# Patient Record
Sex: Male | Born: 1935 | Race: White | Hispanic: No | Smoking: Former smoker
Health system: Southern US, Community
[De-identification: ages and names within clinical notes are randomized; demographics above are authoritative.]

## PROBLEM LIST (undated history)

## (undated) DIAGNOSIS — I2510 Atherosclerotic heart disease of native coronary artery without angina pectoris: Secondary | ICD-10-CM

## (undated) DIAGNOSIS — M1990 Unspecified osteoarthritis, unspecified site: Secondary | ICD-10-CM

## (undated) DIAGNOSIS — J449 Chronic obstructive pulmonary disease, unspecified: Secondary | ICD-10-CM

## (undated) DIAGNOSIS — I219 Acute myocardial infarction, unspecified: Secondary | ICD-10-CM

## (undated) DIAGNOSIS — G20 Parkinson's disease: Secondary | ICD-10-CM

## (undated) DIAGNOSIS — I10 Essential (primary) hypertension: Secondary | ICD-10-CM

## (undated) DIAGNOSIS — G20A1 Parkinson's disease without dyskinesia, without mention of fluctuations: Secondary | ICD-10-CM

## (undated) DIAGNOSIS — E7800 Pure hypercholesterolemia, unspecified: Secondary | ICD-10-CM

## (undated) HISTORY — PX: HEMORROIDECTOMY: SUR656

---

## 2011-10-25 ENCOUNTER — Inpatient Hospital Stay (HOSPITAL_COMMUNITY)
Admission: EM | Admit: 2011-10-25 | Discharge: 2011-11-10 | DRG: 234 | Disposition: A | Discharge status: Home / Self Care | Payer: Medicare Other | Source: Ambulatory Visit | Attending: Thoracic Surgery (Cardiothoracic Vascular Surgery) | Admitting: Thoracic Surgery (Cardiothoracic Vascular Surgery)

## 2011-10-25 ENCOUNTER — Encounter (HOSPITAL_COMMUNITY): Payer: Self-pay | Admitting: Emergency Medicine

## 2011-10-25 ENCOUNTER — Emergency Department (HOSPITAL_COMMUNITY): Payer: Medicare Other

## 2011-10-25 DIAGNOSIS — M129 Arthropathy, unspecified: Secondary | ICD-10-CM | POA: Diagnosis present

## 2011-10-25 DIAGNOSIS — Z9861 Coronary angioplasty status: Secondary | ICD-10-CM

## 2011-10-25 DIAGNOSIS — T8132XA Disruption of internal operation (surgical) wound, not elsewhere classified, initial encounter: Secondary | ICD-10-CM | POA: Diagnosis not present

## 2011-10-25 DIAGNOSIS — Z951 Presence of aortocoronary bypass graft: Secondary | ICD-10-CM

## 2011-10-25 DIAGNOSIS — E785 Hyperlipidemia, unspecified: Secondary | ICD-10-CM | POA: Diagnosis present

## 2011-10-25 DIAGNOSIS — R21 Rash and other nonspecific skin eruption: Secondary | ICD-10-CM | POA: Diagnosis not present

## 2011-10-25 DIAGNOSIS — D62 Acute posthemorrhagic anemia: Secondary | ICD-10-CM | POA: Diagnosis not present

## 2011-10-25 DIAGNOSIS — E8779 Other fluid overload: Secondary | ICD-10-CM | POA: Diagnosis not present

## 2011-10-25 DIAGNOSIS — K219 Gastro-esophageal reflux disease without esophagitis: Secondary | ICD-10-CM | POA: Diagnosis present

## 2011-10-25 DIAGNOSIS — I10 Essential (primary) hypertension: Secondary | ICD-10-CM | POA: Diagnosis present

## 2011-10-25 DIAGNOSIS — Z79899 Other long term (current) drug therapy: Secondary | ICD-10-CM

## 2011-10-25 DIAGNOSIS — R079 Chest pain, unspecified: Secondary | ICD-10-CM

## 2011-10-25 DIAGNOSIS — Z7982 Long term (current) use of aspirin: Secondary | ICD-10-CM

## 2011-10-25 DIAGNOSIS — Z87891 Personal history of nicotine dependence: Secondary | ICD-10-CM

## 2011-10-25 DIAGNOSIS — K5900 Constipation, unspecified: Secondary | ICD-10-CM | POA: Diagnosis not present

## 2011-10-25 DIAGNOSIS — I2510 Atherosclerotic heart disease of native coronary artery without angina pectoris: Secondary | ICD-10-CM | POA: Diagnosis present

## 2011-10-25 DIAGNOSIS — Y832 Surgical operation with anastomosis, bypass or graft as the cause of abnormal reaction of the patient, or of later complication, without mention of misadventure at the time of the procedure: Secondary | ICD-10-CM | POA: Diagnosis not present

## 2011-10-25 DIAGNOSIS — I498 Other specified cardiac arrhythmias: Secondary | ICD-10-CM | POA: Diagnosis not present

## 2011-10-25 DIAGNOSIS — E669 Obesity, unspecified: Secondary | ICD-10-CM | POA: Diagnosis present

## 2011-10-25 DIAGNOSIS — T81329A Deep disruption or dehiscence of operation wound, unspecified, initial encounter: Secondary | ICD-10-CM | POA: Diagnosis not present

## 2011-10-25 DIAGNOSIS — I252 Old myocardial infarction: Secondary | ICD-10-CM

## 2011-10-25 DIAGNOSIS — I214 Non-ST elevation (NSTEMI) myocardial infarction: Principal | ICD-10-CM | POA: Diagnosis present

## 2011-10-25 DIAGNOSIS — I208 Other forms of angina pectoris: Secondary | ICD-10-CM

## 2011-10-25 DIAGNOSIS — I2582 Chronic total occlusion of coronary artery: Secondary | ICD-10-CM | POA: Diagnosis present

## 2011-10-25 DIAGNOSIS — J40 Bronchitis, not specified as acute or chronic: Secondary | ICD-10-CM | POA: Diagnosis not present

## 2011-10-25 DIAGNOSIS — Y9210 Unspecified residential institution as the place of occurrence of the external cause: Secondary | ICD-10-CM | POA: Diagnosis not present

## 2011-10-25 HISTORY — DX: Unspecified osteoarthritis, unspecified site: M19.90

## 2011-10-25 HISTORY — DX: Acute myocardial infarction, unspecified: I21.9

## 2011-10-25 HISTORY — DX: Atherosclerotic heart disease of native coronary artery without angina pectoris: I25.10

## 2011-10-25 HISTORY — DX: Pure hypercholesterolemia, unspecified: E78.00

## 2011-10-25 LAB — DIFFERENTIAL
Basophils Absolute: 0 10*3/uL (ref 0.0–0.1)
Eosinophils Absolute: 0.2 10*3/uL (ref 0.0–0.7)
Eosinophils Relative: 2 % (ref 0–5)
Lymphocytes Relative: 24 % (ref 12–46)
Lymphs Abs: 2.3 10*3/uL (ref 0.7–4.0)
Neutrophils Relative %: 65 % (ref 43–77)

## 2011-10-25 LAB — CARDIAC PANEL(CRET KIN+CKTOT+MB+TROPI)
CK, MB: 17.7 ng/mL (ref 0.3–4.0)
Total CK: 294 U/L — ABNORMAL HIGH (ref 7–232)
Troponin I: 0.8 ng/mL (ref <0.30)

## 2011-10-25 LAB — CBC
Platelets: 273 10*3/uL (ref 150–400)
RBC: 5.15 MIL/uL (ref 4.22–5.81)
RDW: 13.5 % (ref 11.5–15.5)
WBC: 9.7 10*3/uL (ref 4.0–10.5)

## 2011-10-25 LAB — BASIC METABOLIC PANEL
CO2: 27 mEq/L (ref 19–32)
Calcium: 10.1 mg/dL (ref 8.4–10.5)
GFR calc non Af Amer: 50 mL/min — ABNORMAL LOW (ref >90)
Glucose, Bld: 105 mg/dL — ABNORMAL HIGH (ref 70–99)
Potassium: 4.4 mEq/L (ref 3.5–5.1)
Sodium: 136 mEq/L (ref 135–145)

## 2011-10-25 MED ORDER — SODIUM CHLORIDE 0.9 % IV SOLN
INTRAVENOUS | Status: DC
  Administered 2011-10-29: 09:00:00 via INTRAVENOUS

## 2011-10-25 MED ORDER — HEPARIN BOLUS VIA INFUSION
4000.0000 [IU] | Freq: Once | INTRAVENOUS | Status: AC
  Administered 2011-10-25: 4000 [IU] via INTRAVENOUS

## 2011-10-25 MED ORDER — NITROGLYCERIN 2 % TD OINT
1.0000 [in_us] | TOPICAL_OINTMENT | Freq: Once | TRANSDERMAL | Status: AC
  Administered 2011-10-25: 1 [in_us] via TOPICAL

## 2011-10-25 MED ORDER — NITROGLYCERIN 2 % TD OINT
TOPICAL_OINTMENT | TRANSDERMAL | Status: AC
  Filled 2011-10-25: qty 1

## 2011-10-25 MED ORDER — DIPHENHYDRAMINE HCL 25 MG PO CAPS
25.0000 mg | ORAL_CAPSULE | Freq: Once | ORAL | Status: AC
  Administered 2011-10-25: 25 mg via ORAL
  Filled 2011-10-25: qty 1

## 2011-10-25 MED ORDER — BIOTENE DRY MOUTH MT LIQD: 15.0000 mL | Freq: Two times a day (BID) | OROMUCOSAL | Status: DC

## 2011-10-25 MED ORDER — ATORVASTATIN CALCIUM 40 MG PO TABS
40.0000 mg | ORAL_TABLET | Freq: Every day | ORAL | Status: DC
  Administered 2011-10-26 – 2011-10-27 (×2): 40 mg via ORAL
  Filled 2011-10-25 (×3): qty 1

## 2011-10-25 MED ORDER — ONDANSETRON HCL 4 MG/2ML IJ SOLN: 4.0000 mg | Freq: Four times a day (QID) | INTRAMUSCULAR | Status: DC | PRN

## 2011-10-25 MED ORDER — NITROGLYCERIN IN D5W 200-5 MCG/ML-% IV SOLN
5.0000 ug/min | INTRAVENOUS | Status: DC
  Administered 2011-10-25: 5 ug/min via INTRAVENOUS
  Filled 2011-10-25: qty 250

## 2011-10-25 MED ORDER — ACETAMINOPHEN 325 MG PO TABS: 650.0000 mg | ORAL_TABLET | ORAL | Status: DC | PRN

## 2011-10-25 MED ORDER — ASPIRIN 325 MG PO TABS
325.0000 mg | ORAL_TABLET | Freq: Every day | ORAL | Status: DC
  Administered 2011-10-26 – 2011-10-31 (×5): 325 mg via ORAL
  Filled 2011-10-25 (×7): qty 1

## 2011-10-25 MED ORDER — HEPARIN (PORCINE) IN NACL 100-0.45 UNIT/ML-% IJ SOLN
1500.0000 [IU]/h | INTRAMUSCULAR | Status: DC
  Administered 2011-10-25: 1000 [IU]/h via INTRAVENOUS
  Filled 2011-10-25 (×2): qty 250

## 2011-10-25 MED ORDER — NITROGLYCERIN 0.4 MG SL SUBL
0.4000 mg | SUBLINGUAL_TABLET | SUBLINGUAL | Status: DC | PRN
  Administered 2011-10-25: 0.4 mg via SUBLINGUAL
  Filled 2011-10-25: qty 25

## 2011-10-25 MED ORDER — METOPROLOL TARTRATE 50 MG PO TABS
50.0000 mg | ORAL_TABLET | Freq: Two times a day (BID) | ORAL | Status: DC
  Administered 2011-10-25 – 2011-10-31 (×13): 50 mg via ORAL
  Filled 2011-10-25 (×15): qty 1

## 2011-10-25 MED ORDER — PANTOPRAZOLE SODIUM 40 MG PO TBEC
40.0000 mg | DELAYED_RELEASE_TABLET | Freq: Every day | ORAL | Status: DC
  Administered 2011-10-25 – 2011-10-31 (×7): 40 mg via ORAL
  Filled 2011-10-25 (×7): qty 1

## 2011-10-25 MED ORDER — SODIUM CHLORIDE 0.9 % IV SOLN
Freq: Once | INTRAVENOUS | Status: AC
  Administered 2011-10-25: 250 mL via INTRAVENOUS

## 2011-10-25 MED ORDER — RAMIPRIL 2.5 MG PO CAPS
2.5000 mg | ORAL_CAPSULE | Freq: Every day | ORAL | Status: DC
  Administered 2011-10-26 – 2011-10-31 (×6): 2.5 mg via ORAL
  Filled 2011-10-25 (×7): qty 1

## 2011-10-25 NOTE — Progress Notes (Signed)
ANTICOAGULATION CONSULT NOTE - Initial Consult  Pharmacy Consult for Heparin Indication: chest pain/ACS  No Known Allergies  Patient Measurements: Height: 5\' 10"  (177.8 cm) Weight: 238 lb 1.6 oz (108 kg) IBW/kg (Calculated) : 73  Heparin Dosing Weight: 99  Vital Signs: Temp: 97.5 F (36.4 C) (04/19 2115) Temp src: Oral (04/19 2115) BP: 132/69 mmHg (04/19 2200) Pulse Rate: 75  (04/19 2200)  Labs:  Basename 10/25/11 1731 10/25/11 1719  HGB -- 15.4  HCT -- 46.2  PLT -- 273  APTT -- --  LABPROT -- --  INR -- --  HEPARINUNFRC -- --  CREATININE -- 1.33  CKTOTAL 294* --  CKMB 17.7* --  TROPONINI 0.80* --   Estimated Creatinine Clearance: 56.3 ml/min (by C-G formula based on Cr of 1.33).  Medical History: Past Medical History  Diagnosis Date  . Myocardial infarct   . Hypertension   . Hypercholesteremia   . Arthritis   . Coronary artery disease     s/p PCI 10 yrs ago.    Medications:  Prescriptions prior to admission  Medication Sig Dispense Refill  . aspirin 325 MG tablet Take 325 mg by mouth daily.      Marland Kitchen atorvastatin (LIPITOR) 40 MG tablet Take 40 mg by mouth at bedtime.      . cimetidine (TAGAMET) 800 MG tablet Take 800 mg by mouth 2 (two) times daily.      . metoprolol (LOPRESSOR) 50 MG tablet Take 50 mg by mouth 2 (two) times daily.      . nitroGLYCERIN (NITROSTAT) 0.4 MG SL tablet Place 0.4 mg under the tongue every 5 (five) minutes as needed.      . ramipril (ALTACE) 2.5 MG capsule Take 2.5 mg by mouth daily.        Assessment: Heparin started at [facility] ED for chest pain at 7:30 pm  Heparin bolus 4000 units IV x 1 then drip at 1000 units/hr Goal of Therapy:  Heparin level 0.3-0.7 units/ml   Plan:  1) Increase heparin to 1500 units / hr 2) Daily heparin level, CBC  Thanks  Elwin Sleight 10/25/2011,10:27 PM

## 2011-10-25 NOTE — ED Notes (Signed)
CRITICAL VALUE ALERT  Critical value received:  ckmb 17.7  Trop 0.80  Date of notification:  10/25/2011  Time of notification:  632pm  Critical value read back:yes  Nurse who received alert:  c Delanda Bulluck rn  MD notified (1st page):  Dr zammit  Time of first page:  632pm  MD notified (2nd page):  Time of second page:  Responding MD:  Dr zammit  Time MD responded:  632pm Ronnald Collum RN aware

## 2011-10-25 NOTE — ED Notes (Signed)
Intermittent CP and left arm pain since 0300. Pt states pain was relieved for a "little while" by nitro. NAD. VSS.

## 2011-10-25 NOTE — ED Notes (Signed)
Report given to [facility]  

## 2011-10-25 NOTE — ED Notes (Signed)
Pt c/o stabbing pain to L side of chest with weakness down L arm intermittant since yesterday. Denies sob/n/v/d. States did get dizzy in yard yesterday. Alert/oriented. Nondiaphoretic.

## 2011-10-25 NOTE — ED Notes (Signed)
Pt contines to deny pain. O2@ 2 L applied.

## 2011-10-25 NOTE — ED Notes (Signed)
Pt transferred to [facility], via [facility].

## 2011-10-25 NOTE — ED Notes (Signed)
Second IV site started.  Heparin bolus given.  Nitro and heparin gtt started. Nitropaste removed from left chest.   Pt tolerated.  Denies pain at this time.

## 2011-10-25 NOTE — ED Notes (Signed)
Pts pain decreased from 7 to 4 after one nitro. SBP dropped from 134 to 116 after 1st nitro. Spoke to EMD and will give 250 bolus and apply one inch NTG paste.

## 2011-10-25 NOTE — ED Notes (Signed)
Pt denies pain at this time. VS stable. IV infusing without difficulty, site benign.  Pt denies needs at present time. No distress noted.

## 2011-10-25 NOTE — ED Provider Notes (Signed)
History   This chart was scribed for Benny Lennert, MD by Melba Coon. The patient was seen in room APA10/APA10 and the patient's care was started at 5:26PM.    CSN: [id number]  Arrival date & time 10/25/11  1707   First MD Initiated Contact with Patient 10/25/11 1721      Chief Complaint  Patient presents with  . Chest Pain    (Consider location/radiation/quality/duration/timing/severity/associated sxs/prior treatment) Patient is a 78 y.o. male presenting with chest pain.  Chest Pain The chest pain began yesterday. Duration of episode(s) is 2 days. Chest pain occurs constantly. The chest pain is unchanged. The severity of the pain is moderate. The quality of the pain is described as stabbing. The pain does not radiate. Chest pain is worsened by exertion. Primary symptoms include nausea, vomiting and dizziness. Pertinent negatives for primary symptoms include no fever, no shortness of breath and no cough.  Dizziness also occurs with nausea, vomiting, weakness (left arm) and diaphoresis.  Associated symptoms include diaphoresis and weakness (left arm). He tried nitroglycerin and aspirin for the symptoms. Risk factors include being elderly and male gender.  His past medical history is significant for hyperhomocysteinemia, hypertension and MI.   2 weeks ago pt experienced previous but lessened episode and symptoms; didn't want to come in; took ASA and was fine. Hx of MI and heart stents about 10 yrs ago. No known allergies. No other pertinent medical symptoms.  PCP Dr. Dixie Dials in [place]  Past Medical History  Diagnosis Date  . Myocardial infarct   . Hypertension   . Hypercholesteremia   . Arthritis     Past Surgical History  Procedure Date  . Cardiac stents   . Hemorroidectomy     History reviewed. No pertinent family history.  History  Substance Use Topics  . Smoking status: Former Games developer  . Smokeless tobacco: Not on file  . Alcohol Use: No       Review of Systems  Constitutional: Positive for diaphoresis. Negative for fever.  HENT: Negative for congestion and rhinorrhea.   Eyes: Negative for photophobia and redness.  Respiratory: Negative for cough and shortness of breath.   Cardiovascular: Positive for chest pain.  Gastrointestinal: Positive for nausea and vomiting. Negative for diarrhea.  Genitourinary: Negative for dysuria and hematuria.  Musculoskeletal: Negative for myalgias and joint swelling.  Skin: Negative for pallor and rash.  Neurological: Positive for dizziness and weakness (left arm).  Psychiatric/Behavioral: Negative for confusion and decreased concentration.   10 Systems reviewed and all are negative for acute change except as noted in the HPI.   Allergies  Review of patient's allergies indicates no known allergies.  Home Medications   Current Outpatient Rx  Name Route Sig Dispense Refill  . CIMETIDINE 800 MG PO TABS Oral Take 800 mg by mouth 2 (two) times daily.    Marland Kitchen METOPROLOL TARTRATE 50 MG PO TABS Oral Take 50 mg by mouth 2 (two) times daily.      BP 119/62  Pulse 64  Temp(Src) 97.6 F (36.4 C) (Oral)  Resp 16  Ht 5' 10.5" (1.791 m)  Wt 240 lb (108.863 kg)  BMI 33.95 kg/m2  SpO2 96%  Physical Exam  Nursing note and vitals reviewed. Constitutional: He is oriented to person, place, and time. He appears well-developed and well-nourished.       Awake, alert, nontoxic appearance.  HENT:  Head: Normocephalic and atraumatic.  Eyes: EOM are normal. Pupils are equal, round, and reactive to light. Right  eye exhibits no discharge. Left eye exhibits no discharge.  Neck: Normal range of motion. Neck supple.  Cardiovascular: Normal rate, regular rhythm and normal heart sounds.  Exam reveals no gallop and no friction rub.   No murmur heard. Pulmonary/Chest: Effort normal and breath sounds normal. He exhibits no tenderness.  Abdominal: Soft. There is no tenderness. There is no rebound.   Musculoskeletal: He exhibits no tenderness.       Baseline ROM, no obvious new focal weakness.  Neurological: He is alert and oriented to person, place, and time.       Mental status and motor strength appears baseline for patient and situation.  Skin: Skin is warm. No rash noted.  Psychiatric: He has a normal mood and affect.    ED Course  Procedures (including critical care time)  DIAGNOSTIC STUDIES: Oxygen Saturation is 95% on room air, normal by my interpretation.    COORDINATION OF CARE:  5:30PM - EDMD will order blood w/u and CXR for the pt 6:14PM - recheck; sublingual NTG and fluids alleviated the pain; pt states that pain is gone; EDMD states that EKG nml; states that pain was heart-related; EDMD lets pt know that he needs to be transferred and transported to [facility] to see cardiologist; pt agrees with plan of action   Labs Reviewed  BASIC METABOLIC PANEL - Abnormal; Notable for the following:    Glucose, Bld 105 (*)    GFR calc non Af Amer 50 (*)    GFR calc Af Amer 57 (*)    All other components within normal limits  CARDIAC PANEL(CRET KIN+CKTOT+MB+TROPI) - Abnormal; Notable for the following:    Total CK 294 (*)    CK, MB 17.7 (*)    Troponin I 0.80 (*)    Relative Index 6.0 (*)    All other components within normal limits  CBC  DIFFERENTIAL   Dg Chest Portable 1 View  10/25/2011  *RADIOLOGY REPORT*  Clinical Data: Chest and left arm pain.  PORTABLE CHEST - 1 VIEW  Comparison: None.  Findings: Numerous leads and wires project over the chest.  Midline trachea.  Normal heart size.  Mildly tortuous thoracic aorta. Artifactual increased density about the left hemithorax. No pleural effusion or pneumothorax.  Probable vessel on end projecting in the right suprahilar region.  Lungs otherwise clear.  IMPRESSION:  1. No acute cardiopulmonary disease. 2.  Probable vessel on end projecting over the right suprahilar region.  If possible, PA and lateral radiographs should be  performed to exclude pulmonary nodule.  Original Report Authenticated By: Consuello Bossier, M.D.     No diagnosis found. CRITICAL CARE Performed by: Purnell Daigle L   Total critical care time: 45  Critical care time was exclusive of separately billable procedures and treating other patients.  Critical care was necessary to treat or prevent imminent or life-threatening deterioration.  Critical care was time spent personally by me on the following activities: development of treatment plan with patient and/or surrogate as well as nursing, discussions with consultants, evaluation of patient's response to treatment, examination of patient, obtaining history from patient or surrogate, ordering and performing treatments and interventions, ordering and review of laboratory studies, ordering and review of radiographic studies, pulse oximetry and re-evaluation of patient's condition.     Date: 10/25/2011  Rate:74  Rhythm: normal sinus rhythm  QRS Axis: normal  Intervals: normal  ST/T Wave abnormalities: normal  Conduction Disutrbances:none  Narrative Interpretation:   Old EKG Reviewed: none available  MDM  angina  The chart was scribed for me under my direct supervision.  I personally performed the history, physical, and medical decision making and all procedures in the evaluation of this patient.Benny Lennert, MD 10/25/11 [phone]

## 2011-10-25 NOTE — H&P (Signed)
ALVINO LECHUGA is an 78 y.o. male.   Chief Complaint: Chest pain HPI: Mr Budai is a 78 yr old man with a remote MI s/p PCI with "2 stents" about 10 yrs ago. His risk factors include HTN and hyperlipidemia. He presents with left sided chest pain that was stabbing in nature, radiated down his left arm and was associated with nausea, vomiting, diaphoresis, shortness of breath and lightheadeness (not all at the same time). Pain first occurred about 2 weeks ago and then woke him up from sleep at about 3 am last night. Pain occurs mainly at rest but worsened with exertion. It was incompletely relieved by NTG. However, his NTG was prescribed about 4 yrs ago and most likely expired. The NTG he received in the ER completely relieved the pain. His cardiac hx is significant for the remote MI about 10 yrs ago. At that time he lived in [place]. His daughter recently moved him down to [place] about 6 months ago. He does not seem he has had any cardiac re-evaluation for about 4 yrs. I met him pain free, his EKG had non specific st-t abnormalities otherwise normal and his troponin was 0.8. He had already received ASA, heparin drip and was on NTG drip.   Past Medical History  Diagnosis Date  . Myocardial infarct   . Hypertension   . Hypercholesteremia   . Arthritis   . Coronary artery disease     s/p PCI 10 yrs ago.    Past Surgical History  Procedure Date  . Cardiac stents   . Hemorroidectomy     History reviewed. No pertinent family history. Social History:  reports that he has quit smoking. He does not have any smokeless tobacco history on file. He reports that he does not drink alcohol or use illicit drugs.  Allergies: No Known Allergies  Medications Prior to Admission  Medication Dose Route Frequency Provider Last Rate Last Dose  . 0.9 %  sodium chloride infusion   Intravenous Once Benny Lennert, MD 500 mL/hr at 10/25/11 1803 250 mL at 10/25/11 1803  . 0.9 %  sodium chloride infusion    Intravenous Continuous Kathlen Brunswick, MD 10 mL/hr at 10/25/11 2200    . acetaminophen (TYLENOL) tablet 650 mg  650 mg Oral Q4H PRN Kathlen Brunswick, MD      . aspirin tablet 325 mg  325 mg Oral Daily Kathlen Brunswick, MD      . atorvastatin (LIPITOR) tablet 40 mg  40 mg Oral QHS Kathlen Brunswick, MD      . diphenhydrAMINE (BENADRYL) capsule 25 mg  25 mg Oral Once Kathlen Brunswick, MD   25 mg at 10/25/11 2301  . heparin ADULT infusion 100 units/mL (25000 units/250 mL)  1,500 Units/hr Intravenous Continuous Kathlen Brunswick, MD 15 mL/hr at 10/25/11 2239 1,500 Units/hr at 10/25/11 2239  . heparin bolus via infusion 4,000 Units  4,000 Units Intravenous Once Benny Lennert, MD   4,000 Units at 10/25/11 1936  . metoprolol (LOPRESSOR) tablet 50 mg  50 mg Oral BID Kathlen Brunswick, MD   50 mg at 10/25/11 2301  . nitroGLYCERIN (NITROGLYN) 2 % ointment 1 inch  1 inch Topical Once Benny Lennert, MD   1 inch at 10/25/11 1804  . nitroGLYCERIN (NITROSTAT) SL tablet 0.4 mg  0.4 mg Sublingual Q5 min PRN Benny Lennert, MD   0.4 mg at 10/25/11 1739  . nitroGLYCERIN 0.2 mg/mL in dextrose 5 %  infusion  5 mcg/min Intravenous Titrated Benny Lennert, MD 1.5 mL/hr at 10/25/11 1935 5 mcg/min at 10/25/11 1935  . ondansetron (ZOFRAN) injection 4 mg  4 mg Intravenous Q6H PRN Kathlen Brunswick, MD      . pantoprazole (PROTONIX) EC tablet 40 mg  40 mg Oral Q1200 Kathlen Brunswick, MD   40 mg at 10/25/11 2239  . ramipril (ALTACE) capsule 2.5 mg  2.5 mg Oral Daily Kathlen Brunswick, MD      . DISCONTD: antiseptic oral rinse (BIOTENE) solution 15 mL  15 mL Mouth Rinse BID Kathlen Brunswick, MD       Medications Prior to Admission  Medication Sig Dispense Refill  . atorvastatin (LIPITOR) 40 MG tablet Take 40 mg by mouth at bedtime.      . cimetidine (TAGAMET) 800 MG tablet Take 800 mg by mouth 2 (two) times daily.      . metoprolol (LOPRESSOR) 50 MG tablet Take 50 mg by mouth 2 (two) times daily.      .  nitroGLYCERIN (NITROSTAT) 0.4 MG SL tablet Place 0.4 mg under the tongue every 5 (five) minutes as needed.      . ramipril (ALTACE) 2.5 MG capsule Take 2.5 mg by mouth daily.        Results for orders placed during the hospital encounter of 10/25/11 (from the past 48 hour(s))  CBC     Status: Normal   Collection Time   10/25/11  5:19 PM      Component Value Range Comment   WBC 9.7  4.0 - 10.5 (K/uL)    RBC 5.15  4.22 - 5.81 (MIL/uL)    Hemoglobin 15.4  13.0 - 17.0 (g/dL)    HCT 16.1  09.6 - 04.5 (%)    MCV 89.7  78.0 - 100.0 (fL)    MCH 29.9  26.0 - 34.0 (pg)    MCHC 33.3  30.0 - 36.0 (g/dL)    RDW 40.9  81.1 - 91.4 (%)    Platelets 273  150 - 400 (K/uL)   DIFFERENTIAL     Status: Normal   Collection Time   10/25/11  5:19 PM      Component Value Range Comment   Neutrophils Relative 65  43 - 77 (%)    Neutro Abs 6.3  1.7 - 7.7 (K/uL)    Lymphocytes Relative 24  12 - 46 (%)    Lymphs Abs 2.3  0.7 - 4.0 (K/uL)    Monocytes Relative 9  3 - 12 (%)    Monocytes Absolute 0.9  0.1 - 1.0 (K/uL)    Eosinophils Relative 2  0 - 5 (%)    Eosinophils Absolute 0.2  0.0 - 0.7 (K/uL)    Basophils Relative 0  0 - 1 (%)    Basophils Absolute 0.0  0.0 - 0.1 (K/uL)   BASIC METABOLIC PANEL     Status: Abnormal   Collection Time   10/25/11  5:19 PM      Component Value Range Comment   Sodium 136  135 - 145 (mEq/L)    Potassium 4.4  3.5 - 5.1 (mEq/L)    Chloride 100  96 - 112 (mEq/L)    CO2 27  19 - 32 (mEq/L)    Glucose, Bld 105 (*) 70 - 99 (mg/dL)    BUN 13  6 - 23 (mg/dL)    Creatinine, Ser 7.82  0.50 - 1.35 (mg/dL)    Calcium 95.6  8.4 -  10.5 (mg/dL)    GFR calc non Af Amer 50 (*) >90 (mL/min)    GFR calc Af Amer 57 (*) >90 (mL/min)   CARDIAC PANEL(CRET KIN+CKTOT+MB+TROPI)     Status: Abnormal   Collection Time   10/25/11  5:31 PM      Component Value Range Comment   Total CK 294 (*) 7 - 232 (U/L)    CK, MB 17.7 (*) 0.3 - 4.0 (ng/mL)    Troponin I 0.80 (*) <0.30 (ng/mL)    Relative Index  6.0 (*) 0.0 - 2.5     Dg Chest Portable 1 View  10/25/2011  *RADIOLOGY REPORT*  Clinical Data: Chest and left arm pain.  PORTABLE CHEST - 1 VIEW  Comparison: None.  Findings: Numerous leads and wires project over the chest.  Midline trachea.  Normal heart size.  Mildly tortuous thoracic aorta. Artifactual increased density about the left hemithorax. No pleural effusion or pneumothorax.  Probable vessel on end projecting in the right suprahilar region.  Lungs otherwise clear.  IMPRESSION:  1. No acute cardiopulmonary disease. 2.  Probable vessel on end projecting over the right suprahilar region.  If possible, PA and lateral radiographs should be performed to exclude pulmonary nodule.  Original Report Authenticated By: Consuello Bossier, M.D.    Review of Systems  Constitutional: Positive for diaphoresis.  HENT: Negative.   Eyes: Negative.   Respiratory: Positive for shortness of breath. Negative for cough, hemoptysis, sputum production and wheezing.   Cardiovascular: Positive for chest pain and palpitations. Negative for orthopnea, claudication, leg swelling and PND.  Gastrointestinal: Positive for heartburn, nausea and vomiting. Negative for abdominal pain and diarrhea.  Genitourinary: Negative.   Musculoskeletal: Negative.   Skin: Negative.   Neurological: Positive for dizziness.  Endo/Heme/Allergies: Negative.   Psychiatric/Behavioral: Negative.     Blood pressure 140/69, pulse 72, temperature 97.5 F (36.4 C), temperature source Oral, resp. rate 18, height 5\' 10"  (1.778 m), weight 238 lb 1.6 oz (108 kg), SpO2 97.00%. Physical Exam  Constitutional: He is oriented to person, place, and time. No distress.  HENT:  Head: Normocephalic.  Mouth/Throat: No oropharyngeal exudate.  Eyes: Conjunctivae and EOM are normal. Pupils are equal, round, and reactive to light. No scleral icterus.  Neck: No JVD present. No tracheal deviation present. No thyromegaly present.  Cardiovascular: Normal rate,  regular rhythm, normal heart sounds and intact distal pulses.  Exam reveals no gallop and no friction rub.   No murmur heard. Respiratory: Effort normal and breath sounds normal. No respiratory distress. He has no rales.  GI: Soft. He exhibits distension. There is no tenderness.       Distension is from fat.  Genitourinary:       Not done.  Musculoskeletal: Normal range of motion. He exhibits no edema and no tenderness.  Lymphadenopathy:    He has no cervical adenopathy.  Neurological: He is alert and oriented to person, place, and time.  Skin: Skin is warm and dry. No rash noted. He is not diaphoretic. No erythema.  Psychiatric: He has a normal mood and affect. His behavior is normal. Judgment and thought content normal.     Assessment/Plan NSTEMI in a patient with known CAD s/p remote PCI (No S3, no JVD, no rales, no hypotension, pain free) HTN Hyperlipidemia GERD  PLAN Admit to step down ASA 325 mg daily Plavix 300mg  stat then 75mg  daily Heparin infusion NTG drip Metoprolol 50 mg twice daily Ramipril 2.5mg  daily Lipitor 40 mg daily Protonix to treat  his GERD and for GI bleed prophylaxis Serial EKG and cardiac markers 2D echo in am  Cath on Monday.  Grandville Silos 10/25/2011, 11:42 PM

## 2011-10-26 DIAGNOSIS — I10 Essential (primary) hypertension: Secondary | ICD-10-CM

## 2011-10-26 DIAGNOSIS — E785 Hyperlipidemia, unspecified: Secondary | ICD-10-CM

## 2011-10-26 DIAGNOSIS — I214 Non-ST elevation (NSTEMI) myocardial infarction: Secondary | ICD-10-CM

## 2011-10-26 DIAGNOSIS — I517 Cardiomegaly: Secondary | ICD-10-CM

## 2011-10-26 LAB — CARDIAC PANEL(CRET KIN+CKTOT+MB+TROPI)
CK, MB: 43.9 ng/mL (ref 0.3–4.0)
CK, MB: 32.9 ng/mL (ref 0.3–4.0)
Total CK: 521 U/L — ABNORMAL HIGH (ref 7–232)
Troponin I: 2.88 ng/mL (ref <0.30)
Troponin I: 4.44 ng/mL (ref <0.30)

## 2011-10-26 LAB — CBC
HCT: 42.7 % (ref 39.0–52.0)
HCT: 44.3 % (ref 39.0–52.0)
Hemoglobin: 14.6 g/dL (ref 13.0–17.0)
Hemoglobin: 15.4 g/dL (ref 13.0–17.0)
MCH: 30.4 pg (ref 26.0–34.0)
MCHC: 34.2 g/dL (ref 30.0–36.0)
MCHC: 34.8 g/dL (ref 30.0–36.0)
MCV: 88.8 fL (ref 78.0–100.0)
MCV: 89.5 fL (ref 78.0–100.0)
RDW: 13.7 % (ref 11.5–15.5)

## 2011-10-26 LAB — BASIC METABOLIC PANEL
BUN: 11 mg/dL (ref 6–23)
BUN: 10 mg/dL (ref 6–23)
CO2: 27 mEq/L (ref 19–32)
CO2: 22 mEq/L (ref 19–32)
Chloride: 101 mEq/L (ref 96–112)
Creatinine, Ser: 1.12 mg/dL (ref 0.50–1.35)
GFR calc non Af Amer: 55 mL/min — ABNORMAL LOW (ref >90)
Glucose, Bld: 115 mg/dL — ABNORMAL HIGH (ref 70–99)
Glucose, Bld: 110 mg/dL — ABNORMAL HIGH (ref 70–99)
Potassium: 3.6 mEq/L (ref 3.5–5.1)
Potassium: 4.3 mEq/L (ref 3.5–5.1)

## 2011-10-26 LAB — LIPID PANEL
Cholesterol: 166 mg/dL (ref 0–200)
LDL Cholesterol: 86 mg/dL (ref 0–99)
VLDL: 26 mg/dL (ref 0–40)

## 2011-10-26 LAB — PROTIME-INR: Prothrombin Time: 13.2 seconds (ref 11.6–15.2)

## 2011-10-26 LAB — MAGNESIUM: Magnesium: 2.2 mg/dL (ref 1.5–2.5)

## 2011-10-26 LAB — PRO B NATRIURETIC PEPTIDE: Pro B Natriuretic peptide (BNP): 2084 pg/mL — ABNORMAL HIGH (ref 0–450)

## 2011-10-26 MED ORDER — HEPARIN (PORCINE) IN NACL 100-0.45 UNIT/ML-% IJ SOLN
1300.0000 [IU]/h | INTRAMUSCULAR | Status: DC
  Administered 2011-10-26 – 2011-10-27 (×2): 1300 [IU]/h via INTRAVENOUS
  Filled 2011-10-26 (×4): qty 250

## 2011-10-26 MED ORDER — CLOPIDOGREL BISULFATE 300 MG PO TABS
300.0000 mg | ORAL_TABLET | Freq: Once | ORAL | Status: AC
  Administered 2011-10-26: 300 mg via ORAL
  Filled 2011-10-26: qty 1

## 2011-10-26 MED ORDER — ALUM & MAG HYDROXIDE-SIMETH 200-200-20 MG/5ML PO SUSP
15.0000 mL | ORAL | Status: DC | PRN
  Filled 2011-10-26: qty 30

## 2011-10-26 MED ORDER — ZOLPIDEM TARTRATE 5 MG PO TABS
2.5000 mg | ORAL_TABLET | Freq: Once | ORAL | Status: AC
  Administered 2011-10-26: 2.5 mg via ORAL
  Filled 2011-10-26: qty 1

## 2011-10-26 MED ORDER — CLOPIDOGREL BISULFATE 75 MG PO TABS
75.0000 mg | ORAL_TABLET | Freq: Every day | ORAL | Status: DC
  Administered 2011-10-26 – 2011-10-27 (×2): 75 mg via ORAL
  Filled 2011-10-26 (×4): qty 1

## 2011-10-26 NOTE — Progress Notes (Signed)
Subjective:   Mr Hevia is a 78 yr old man with HTN, HL and CAD with a remote MI s/p PCI with "2 stents" about 10-15 yrs ago in [place]. He was admitted last night (4/19) with NSTEMI.  Remains pain free on heparin, NTG, ASA, plavix, lopressor and atorva  Intake/Output Summary (Last 24 hours) at 10/26/11 0952 Last data filed at 10/26/11 0800  Gross per 24 hour  Intake 959.71 ml  Output   1000 ml  Net -40.29 ml    Current meds:    . sodium chloride   Intravenous Once  . aspirin  325 mg Oral Daily  . atorvastatin  40 mg Oral QHS  . clopidogrel  300 mg Oral Once  . clopidogrel  75 mg Oral Q breakfast  . diphenhydrAMINE  25 mg Oral Once  . heparin  4,000 Units Intravenous Once  . metoprolol  50 mg Oral BID  . nitroGLYCERIN  1 inch Topical Once  . pantoprazole  40 mg Oral Q1200  . ramipril  2.5 mg Oral Daily  . DISCONTD: antiseptic oral rinse  15 mL Mouth Rinse BID   Infusions:    . sodium chloride 10 mL/hr at 10/25/11 2200  . heparin    . nitroGLYCERIN 5 mcg/min (10/25/11 1935)  . DISCONTD: heparin 1,500 Units/hr (10/25/11 2239)     Objective:  Blood pressure 121/63, pulse 65, temperature 98.1 F (36.7 C), temperature source Oral, resp. rate 21, height 5\' 10"  (1.778 m), weight 108.4 kg (238 lb 15.7 oz), SpO2 95.00%. Weight change:   Physical Exam: General:  Well appearing. No resp difficulty HEENT: normal Neck: supple. JVP unable to see . Carotids 2+ bilat; no bruits. No lymphadenopathy or thryomegaly appreciated. Cor: PMI non palpable. HS distant  Regular rate & rhythm. No rubs, gallops or murmurs. Lungs: clear Abdomen: soft, obese nontender, nondistended Good bowel sounds. Extremities: no cyanosis, clubbing, rash, edema Neuro: alert & orientedx3, cranial nerves grossly intact. moves all 4 extremities w/o difficulty. Affect pleasant  Telemetry: SR 60s  Lab Results: Basic Metabolic Panel:  Lab 10/26/11 2956 10/25/11 1719  NA 137 136  K 3.6 4.4  CL 102 100   CO2 27 27  GLUCOSE 115* 105*  BUN 11 13  CREATININE 1.22 1.33  CALCIUM 9.2 10.1  MG 2.2 --  PHOS -- --   Liver Function Tests: No results found for this basename: AST:5,ALT:5,ALKPHOS:5,BILITOT:5,PROT:5,ALBUMIN:5 in the last 168 hours No results found for this basename: LIPASE:5,AMYLASE:5 in the last 168 hours No results found for this basename: AMMONIA:5 in the last 168 hours CBC:  Lab 10/26/11 0205 10/25/11 1719  WBC 10.2 9.7  NEUTROABS -- 6.3  HGB 14.6 15.4  HCT 42.7 46.2  MCV 88.8 89.7  PLT 253 273   Cardiac Enzymes:  Lab 10/26/11 0832 10/26/11 0149 10/25/11 1731  CKTOTAL 609* 548* 294*  CKMB 43.9* 42.0* 17.7*  CKMBINDEX -- -- --  TROPONINI 4.44* 2.88* 0.80*   BNP: No components found with this basename: POCBNP:5 CBG: No results found for this basename: GLUCAP:5 in the last 168 hours Microbiology: No results found for this basename: cult   No results found for this basename: CULT:2,SDES:2 in the last 168 hours  Imaging: Dg Chest Portable 1 View  10/25/2011  *RADIOLOGY REPORT*  Clinical Data: Chest and left arm pain.  PORTABLE CHEST - 1 VIEW  Comparison: None.  Findings: Numerous leads and wires project over the chest.  Midline trachea.  Normal heart size.  Mildly tortuous thoracic  aorta. Artifactual increased density about the left hemithorax. No pleural effusion or pneumothorax.  Probable vessel on end projecting in the right suprahilar region.  Lungs otherwise clear.  IMPRESSION:  1. No acute cardiopulmonary disease. 2.  Probable vessel on end projecting over the right suprahilar region.  If possible, PA and lateral radiographs should be performed to exclude pulmonary nodule.  Original Report Authenticated By: Consuello Bossier, M.D.     ASSESSMENT:  1. NSTEMI 2. CAD s/p remote stents x 2 3. HTN 4. HL 5. Obesity  PLAN/DISCUSSION:  Stable. No ongoing CP. Will need cath Monday. Continue current regimen. Given elevated BNP will check echo. Discussed with patient  and daughter. Place cardiac rehab consult.    LOS: 1 day    Arvilla Meres, MD 10/26/2011, 9:52 AM

## 2011-10-26 NOTE — Progress Notes (Signed)
CRITICAL VALUE ALERT  Critical value received:  CKMB 42.0 Trop 2.88  Date of notification:  10/26/11  Time of notification:  0330  Critical value read back:yes  Nurse who received alert:  Isela Stantz, Breck Coons  MD notified (1st page):  [place] MD  Time of first page:  0330  MD notified (2nd page):  Time of second page:  Responding MD:  Fausto Skillern MD  Time MD responded: 0330

## 2011-10-26 NOTE — Progress Notes (Signed)
  Echocardiogram 2D Echocardiogram has been performed.  Jt Brabec, Real Cons 10/26/2011, 4:16 PM

## 2011-10-26 NOTE — Progress Notes (Signed)
ANTICOAGULATION CONSULT NOTE   Pharmacy Consult for Heparin Indication: chest pain/ACS  No Known Allergies  Patient Measurements: Height: 5\' 10"  (177.8 cm) Weight: 238 lb 1.6 oz (108 kg) IBW/kg (Calculated) : 73  Heparin Dosing Weight: 99  Vital Signs: Temp: 98 F (36.7 C) (04/20 0000) Temp src: Oral (04/20 0000) BP: 136/66 mmHg (04/20 0000) Pulse Rate: 71  (04/20 0000)  Labs:  Basename 10/26/11 0205 10/25/11 1731 10/25/11 1719  HGB 14.6 -- 15.4  HCT 42.7 -- 46.2  PLT 253 -- 273  APTT -- -- --  LABPROT 13.2 -- --  INR 0.98 -- --  HEPARINUNFRC 0.60 -- --  CREATININE -- -- 1.33  CKTOTAL -- 294* --  CKMB -- 17.7* --  TROPONINI -- 0.80* --   Estimated Creatinine Clearance: 56.3 ml/min (by C-G formula based on Cr of 1.33).   Assessment: 78 yo male with chest pain for Heparin  Goal of Therapy:  Heparin level 0.3-0.7 units/ml   Plan:  Continue Heparin at current rate Recheck level with next cardiac panel to verify.   Jesus Maldonado, Jesus Maldonado 10/26/2011,3:19 AM

## 2011-10-26 NOTE — Progress Notes (Signed)
ANTICOAGULATION CONSULT NOTE - Follow Up Consult  Pharmacy Consult for Heparin Indication: chest pain/ACS  No Known Allergies  Patient Measurements: Height: 5\' 10"  (177.8 cm) Weight: 238 lb 15.7 oz (108.4 kg) IBW/kg (Calculated) : 73   Vital Signs: Temp: 98.3 F (36.8 C) (04/20 1946) Temp src: Oral (04/20 1946) BP: 111/58 mmHg (04/20 1946) Pulse Rate: 70  (04/20 1200)  Labs:  Basename 10/26/11 2014 10/26/11 1429 10/26/11 0845 10/26/11 0832 10/26/11 0205 10/26/11 0149 10/25/11 1719  HGB -- -- -- 15.4 14.6 -- --  HCT -- -- -- 44.3 42.7 -- 46.2  PLT -- -- -- 267 253 -- 273  APTT -- -- -- -- -- -- --  LABPROT -- -- -- -- 13.2 -- --  INR -- -- -- -- 0.98 -- --  HEPARINUNFRC 0.45 -- 1.12* -- 0.60 -- --  CREATININE -- -- -- 1.12 1.22 -- 1.33  CKTOTAL -- 521* -- 609* -- 548* --  CKMB -- 32.9* -- 43.9* -- 42.0* --  TROPONINI -- 6.61* -- 4.44* -- 2.88* --   Estimated Creatinine Clearance: 67 ml/min (by C-G formula based on Cr of 1.12).   Medications:  Scheduled:    . aspirin  325 mg Oral Daily  . atorvastatin  40 mg Oral QHS  . clopidogrel  300 mg Oral Once  . clopidogrel  75 mg Oral Q breakfast  . diphenhydrAMINE  25 mg Oral Once  . metoprolol  50 mg Oral BID  . pantoprazole  40 mg Oral Q1200  . ramipril  2.5 mg Oral Daily  . zolpidem  2.5 mg Oral Once  . DISCONTD: antiseptic oral rinse  15 mL Mouth Rinse BID    Assessment: 78yo male on heparin for ACS, rate adjusted earlier to day for supratherapeutic HL.  Now within goal range with HL = 0.45.  No bleeding problems noted.  Goal of Therapy:  Heparin level 0.3-0.7   Plan:  1.  Continue current rate 2.  F/U in AM  Marisue Humble, PharmD Clinical Pharmacist [facility]

## 2011-10-26 NOTE — Progress Notes (Signed)
ANTICOAGULATION CONSULT NOTE - Follow Up Consult  Pharmacy Consult for Heparin Indication: chest pain/ACS  No Known Allergies  Patient Measurements: Height: 5\' 10"  (177.8 cm) Weight: 238 lb 15.7 oz (108.4 kg) IBW/kg (Calculated) : 73  Heparin Dosing Weight: 96.4   Vital Signs: Temp: 98.1 F (36.7 C) (04/20 0748) Temp src: Oral (04/20 0748) BP: 121/63 mmHg (04/20 0800) Pulse Rate: 65  (04/20 0800)  Labs:  Basename 10/26/11 0845 10/26/11 0205 10/26/11 0149 10/25/11 1731 10/25/11 1719  HGB -- 14.6 -- -- 15.4  HCT -- 42.7 -- -- 46.2  PLT -- 253 -- -- 273  APTT -- -- -- -- --  LABPROT -- 13.2 -- -- --  INR -- 0.98 -- -- --  HEPARINUNFRC 1.12* 0.60 -- -- --  CREATININE -- 1.22 -- -- 1.33  CKTOTAL -- -- 548* 294* --  CKMB -- -- 42.0* 17.7* --  TROPONINI -- -- 2.88* 0.80* --   Estimated Creatinine Clearance: 61.5 ml/min (by C-G formula based on Cr of 1.22).    Assessment: 40yoM male being managed on heparin for Chest pain/ACS with plans for Cath on Monday per MD notes. Heparin level supra-therapeutic at 1.12 units/ml at current rate of 1500 units/hr. No bleeding or issues with line noted by RN Nettie Elm.  Goal of Therapy:  Heparin level 0.3-0.7 units/ml   Plan:  1. Hold Heparin drip for 1 hour then restart drip at lower rate of 1300 units/hr 3. Check 8-hour heparin level (after restart), daily CBC  Benjaman Pott, PharmD     Pager [phone] 10/26/2011   9:40 AM

## 2011-10-27 LAB — CBC
HCT: 43.9 % (ref 39.0–52.0)
MCH: 30.2 pg (ref 26.0–34.0)
MCV: 88.9 fL (ref 78.0–100.0)
Platelets: 238 10*3/uL (ref 150–400)
RDW: 13.7 % (ref 11.5–15.5)

## 2011-10-27 LAB — HEMOGLOBIN A1C
Hgb A1c MFr Bld: 5.8 % — ABNORMAL HIGH (ref <5.7)
Mean Plasma Glucose: 120 mg/dL — ABNORMAL HIGH (ref <117)

## 2011-10-27 LAB — HEPARIN LEVEL (UNFRACTIONATED): Heparin Unfractionated: 0.44 IU/mL (ref 0.30–0.70)

## 2011-10-27 MED ORDER — ZOLPIDEM TARTRATE 5 MG PO TABS
2.5000 mg | ORAL_TABLET | Freq: Every evening | ORAL | Status: DC | PRN
  Administered 2011-10-27 – 2011-10-31 (×5): 2.5 mg via ORAL
  Filled 2011-10-27 (×5): qty 1

## 2011-10-27 NOTE — Progress Notes (Signed)
ANTICOAGULATION CONSULT NOTE - Follow Up Consult  Pharmacy Consult for Heparin Indication: chest pain/ACS  No Known Allergies  Patient Measurements: Height: 5\' 10"  (177.8 cm) Weight: 238 lb 15.7 oz (108.4 kg) IBW/kg (Calculated) : 73  Heparin dosing weight: 96.4 kg  Vital Signs: Temp: 98.4 F (36.9 C) (04/21 0800) Temp src: Oral (04/21 0800) BP: 94/61 mmHg (04/21 0800)  Labs:  Alvira Philips 10/27/11 0625 10/26/11 2014 10/26/11 1429 10/26/11 0845 10/26/11 0832 10/26/11 0205 10/26/11 0149 10/25/11 1719  HGB 14.9 -- -- -- 15.4 -- -- --  HCT 43.9 -- -- -- 44.3 42.7 -- --  PLT 238 -- -- -- 267 253 -- --  APTT -- -- -- -- -- -- -- --  LABPROT -- -- -- -- -- 13.2 -- --  INR -- -- -- -- -- 0.98 -- --  HEPARINUNFRC 0.44 0.45 -- 1.12* -- -- -- --  CREATININE -- -- -- -- 1.12 1.22 -- 1.33  CKTOTAL -- -- 521* -- 609* -- 548* --  CKMB -- -- 32.9* -- 43.9* -- 42.0* --  TROPONINI -- -- 6.61* -- 4.44* -- 2.88* --   Estimated Creatinine Clearance: 67 ml/min (by C-G formula based on Cr of 1.12).   Assessment: 67yoM male being managed on heparin for Chest pain/ACS with plans for Cath on Monday per MD notes. Now within goal range (HL = 0.44 units/ml) at current rate of 1300 units/hr. CBC stable. No bleeding or issues noted by RN or in MD notes.  Goal of Therapy:  Heparin level 0.3-0.7   Plan:  1.  Continue IV heparin at current rate of 1300 units/hr  2.  Follow-up daily heparin level, CBC, and cath plans on Monday.  Benjaman Pott, PharmD     Pager [phone] 10/27/2011   11:36 AM

## 2011-10-27 NOTE — Progress Notes (Addendum)
 Subjective:   Jesus Maldonado is a 78 yr old man with HTN, HL and CAD with a remote MI s/p PCI with "2 stents" about 10-15 yrs ago in [place]. He was admitted last night (4/19) with NSTEMI.  Remains pain free on heparin, NTG, ASA, plavix, lopressor and atorvastatin. No bleeding on heparin.   Echo yesterday ejection fraction was in the range of 60% to 65%. Possible mild hypokinesis of the mid-distal anteroseptal myocardium.  Was started on Plavix on admit with 300mg load and now 75mg per day.    Intake/Output Summary (Last 24 hours) at 10/27/11 0958 Last data filed at 10/27/11 0900  Gross per 24 hour  Intake 1081.17 ml  Output   2250 ml  Net -1168.83 ml    Current meds:    . aspirin  325 mg Oral Daily  . atorvastatin  40 mg Oral QHS  . clopidogrel  75 mg Oral Q breakfast  . metoprolol  50 mg Oral BID  . pantoprazole  40 mg Oral Q1200  . ramipril  2.5 mg Oral Daily  . zolpidem  2.5 mg Oral Once   Infusions:    . sodium chloride 10 mL/hr at 10/25/11 2200  . heparin 1,300 Units/hr (10/26/11 1005)  . nitroGLYCERIN 5 mcg/min (10/25/11 1935)     Objective:  Blood pressure 94/61, pulse 79, temperature 98.4 F (36.9 C), temperature source Oral, resp. rate 17, height 5' 10" (1.778 m), weight 108.4 kg (238 lb 15.7 oz), SpO2 93.00%. Weight change:   Physical Exam: General:  Well appearing. No resp difficulty HEENT: normal Neck: supple. JVP unable to see . Carotids 2+ bilat; no bruits. No lymphadenopathy or thryomegaly appreciated. Cor: PMI non palpable. HS distant  Regular rate & rhythm. No rubs, gallops or murmurs. Lungs: clear Abdomen: soft, obese nontender, nondistended Good bowel sounds. Extremities: no cyanosis, clubbing, rash, edema Neuro: alert & orientedx3, cranial nerves grossly intact. moves all 4 extremities w/o difficulty. Affect pleasant  Telemetry: SR 60s  Lab Results: Basic Metabolic Panel:  Lab 10/26/11 0832 10/26/11 0205 10/25/11 1719  NA 135 137 136    K 4.3 3.6 --  CL 101 102 100  CO2 22 27 27  GLUCOSE 110* 115* 105*  BUN 10 11 13  CREATININE 1.12 1.22 1.33  CALCIUM 9.3 9.2 10.1  MG -- 2.2 --  PHOS -- -- --   Liver Function Tests: No results found for this basename: AST:5,ALT:5,ALKPHOS:5,BILITOT:5,PROT:5,ALBUMIN:5 in the last 168 hours No results found for this basename: LIPASE:5,AMYLASE:5 in the last 168 hours No results found for this basename: AMMONIA:5 in the last 168 hours CBC:  Lab 10/27/11 0625 10/26/11 0832 10/26/11 0205 10/25/11 1719  WBC 10.6* 12.4* 10.2 9.7  NEUTROABS -- -- -- 6.3  HGB 14.9 15.4 14.6 15.4  HCT 43.9 44.3 42.7 46.2  MCV 88.9 89.5 88.8 89.7  PLT 238 267 253 273   Cardiac Enzymes:  Lab 10/26/11 1429 10/26/11 0832 10/26/11 0149 10/25/11 1731  CKTOTAL 521* 609* 548* 294*  CKMB 32.9* 43.9* 42.0* 17.7*  CKMBINDEX -- -- -- --  TROPONINI 6.61* 4.44* 2.88* 0.80*   BNP: No components found with this basename: POCBNP:5 CBG: No results found for this basename: GLUCAP:5 in the last 168 hours Microbiology: No results found for this basename: cult   No results found for this basename: CULT:2,SDES:2 in the last 168 hours  Imaging: Dg Chest Portable 1 View  10/25/2011  *RADIOLOGY REPORT*  Clinical Data: Chest and left arm pain.  PORTABLE   CHEST - 1 VIEW  Comparison: None.  Findings: Numerous leads and wires project over the chest.  Midline trachea.  Normal heart size.  Mildly tortuous thoracic aorta. Artifactual increased density about the left hemithorax. No pleural effusion or pneumothorax.  Probable vessel on end projecting in the right suprahilar region.  Lungs otherwise clear.  IMPRESSION:  1. No acute cardiopulmonary disease. 2.  Probable vessel on end projecting over the right suprahilar region.  If possible, PA and lateral radiographs should be performed to exclude pulmonary nodule.  Original Report Authenticated By: KYLE D. TALBOT, M.D.     ASSESSMENT:  1. NSTEMI 2. CAD s/p remote stents x 2 3.  HTN 4. HL 5. Obesity  PLAN/DISCUSSION:  Stable. No further CP. Will need cath tomorrow. EF preserved by echo. Continue current regimen. Discussed with patient and family. Place cardiac rehab consult. Cath orders written.  Was started on Plavix on admit with 300mg load and now 75mg per day. Will check P2Y12. If platelets not inhibited significantly consider switch to Effient or Brillinta.   LOS: 2 days     , MD 10/27/2011, 9:58 AM  

## 2011-10-28 ENCOUNTER — Encounter (HOSPITAL_COMMUNITY)
Admission: EM | Disposition: A | Discharge status: Home / Self Care | Payer: Self-pay | Source: Ambulatory Visit | Attending: Thoracic Surgery (Cardiothoracic Vascular Surgery)

## 2011-10-28 ENCOUNTER — Other Ambulatory Visit: Payer: Self-pay

## 2011-10-28 DIAGNOSIS — I214 Non-ST elevation (NSTEMI) myocardial infarction: Secondary | ICD-10-CM

## 2011-10-28 DIAGNOSIS — I2510 Atherosclerotic heart disease of native coronary artery without angina pectoris: Secondary | ICD-10-CM

## 2011-10-28 HISTORY — PX: PERCUTANEOUS CORONARY INTERVENTION-BALLOON ONLY: SHX6014

## 2011-10-28 HISTORY — PX: LEFT HEART CATHETERIZATION WITH CORONARY ANGIOGRAM: SHX5451

## 2011-10-28 LAB — HEPARIN LEVEL (UNFRACTIONATED): Heparin Unfractionated: 0.43 IU/mL (ref 0.30–0.70)

## 2011-10-28 LAB — CBC
MCH: 30.1 pg (ref 26.0–34.0)
MCV: 89.2 fL (ref 78.0–100.0)
Platelets: 234 10*3/uL (ref 150–400)
RBC: 4.71 MIL/uL (ref 4.22–5.81)
RDW: 13.7 % (ref 11.5–15.5)
WBC: 8.6 10*3/uL (ref 4.0–10.5)

## 2011-10-28 SURGERY — LEFT HEART CATHETERIZATION WITH CORONARY ANGIOGRAM
Anesthesia: LOCAL

## 2011-10-28 MED ORDER — HEPARIN SODIUM (PORCINE) 1000 UNIT/ML IJ SOLN
INTRAMUSCULAR | Status: AC
  Filled 2011-10-28: qty 1

## 2011-10-28 MED ORDER — SODIUM CHLORIDE 0.9 % IV SOLN
1.0000 mL/kg/h | INTRAVENOUS | Status: DC
  Administered 2011-10-28: 1 mL/kg/h via INTRAVENOUS

## 2011-10-28 MED ORDER — ATORVASTATIN CALCIUM 80 MG PO TABS
80.0000 mg | ORAL_TABLET | Freq: Every day | ORAL | Status: DC
  Administered 2011-10-28 – 2011-10-31 (×4): 80 mg via ORAL
  Filled 2011-10-28 (×5): qty 1

## 2011-10-28 MED ORDER — LIDOCAINE HCL (PF) 1 % IJ SOLN
INTRAMUSCULAR | Status: AC
  Filled 2011-10-28: qty 30

## 2011-10-28 MED ORDER — SODIUM CHLORIDE 0.9 % IV SOLN: 250.0000 mL | INTRAVENOUS | Status: DC | PRN

## 2011-10-28 MED ORDER — FENTANYL CITRATE 0.05 MG/ML IJ SOLN
INTRAMUSCULAR | Status: AC
  Filled 2011-10-28: qty 2

## 2011-10-28 MED ORDER — SODIUM CHLORIDE 0.9 % IV SOLN
1.7500 mg/kg/h | INTRAVENOUS | Status: DC
  Filled 2011-10-28: qty 250

## 2011-10-28 MED ORDER — ASPIRIN 81 MG PO CHEW
324.0000 mg | CHEWABLE_TABLET | ORAL | Status: AC
  Administered 2011-10-28: 324 mg via ORAL
  Filled 2011-10-28: qty 4

## 2011-10-28 MED ORDER — MIDAZOLAM HCL 2 MG/2ML IJ SOLN
INTRAMUSCULAR | Status: AC
  Filled 2011-10-28: qty 2

## 2011-10-28 MED ORDER — SODIUM CHLORIDE 0.9 % IJ SOLN: 3.0000 mL | INTRAMUSCULAR | Status: DC | PRN

## 2011-10-28 MED ORDER — SODIUM CHLORIDE 0.9 % IV SOLN
INTRAVENOUS | Status: AC
  Administered 2011-10-28: 11:00:00 via INTRAVENOUS

## 2011-10-28 MED ORDER — CLOPIDOGREL BISULFATE 75 MG PO TABS
75.0000 mg | ORAL_TABLET | ORAL | Status: AC
  Administered 2011-10-28: 75 mg via ORAL

## 2011-10-28 MED ORDER — HEPARIN (PORCINE) IN NACL 2-0.9 UNIT/ML-% IJ SOLN
INTRAMUSCULAR | Status: AC
  Filled 2011-10-28: qty 2000

## 2011-10-28 MED ORDER — SODIUM CHLORIDE 0.9 % IJ SOLN: 3.0000 mL | Freq: Two times a day (BID) | INTRAMUSCULAR | Status: DC

## 2011-10-28 MED ORDER — NITROGLYCERIN 0.2 MG/ML ON CALL CATH LAB
INTRAVENOUS | Status: AC
  Filled 2011-10-28: qty 1

## 2011-10-28 MED ORDER — BIVALIRUDIN 250 MG IV SOLR
INTRAVENOUS | Status: AC
  Filled 2011-10-28: qty 250

## 2011-10-28 NOTE — Progress Notes (Signed)
Tr band removed and tolerated well, no bleeding nor  hematoma noted.

## 2011-10-28 NOTE — Interval H&P Note (Signed)
History and Physical Interval Note:  10/28/2011 7:39 AM  Jesus Maldonado  has presented today for cardiac cath with the diagnosis of nstemi/cp  The various methods of treatment have been discussed with the patient and family. After consideration of risks, benefits and other options for treatment, the patient has consented to  Procedure(s) (LRB): LEFT HEART CATHETERIZATION WITH CORONARY ANGIOGRAM (N/A) as a surgical intervention .  The patients' history has been reviewed, patient examined, no change in status, stable for surgery.  I have reviewed the patients' chart and labs.  Questions were answered to the patient's satisfaction.     Lucila Klecka

## 2011-10-28 NOTE — CV Procedure (Signed)
Cardiac Catheterization Operative Report  Jesus Maldonado [id number] 4/22/20138:48 AM Theodora Blow, FNP, FNP  Procedure Performed:  1. Left Heart Catheterization 2. Selective Coronary Angiography 3. PTCA mid LAD (balloon only)  Operator: Verne Carrow, MD  Arterial access site:  Right radial artery.   Indication:  Pt with history of HTN, HLD, CAD s/p previous stent placement in [place] 10-15 years ago who is admitted with chest pain and found to have NSTEMI. He is chest pain free this am.                            Procedure Details: The risks, benefits, complications, treatment options, and expected outcomes were discussed with the patient. The patient and/or family concurred with the proposed plan, giving informed consent. The patient was brought to the cath lab after IV hydration was begun and oral premedication was given. The patient was further sedated with Versed and Fentanyl. The right wrist was assessed with an Allens test which was positive. The right wrist was prepped and draped in a sterile fashion. 1% lidocaine was used for local anesthesia. Using the modified Seldinger access technique, a 5 French sheath was placed in the right radial artery. 1.25 mg Nicardipine was given through the sheath. 4000 units IV heparin was given. Standard diagnostic catheters were used to perform selective coronary angiography. A pigtail catheter was used to measure LV pressures.   PCI: The patient was found to have a totally occluded mid LAD just after the Diagonal branch. The mid and distal LAD filled slowly, possibly from bridging collaterals. There was no clear channel but I felt that based on slow distal filling, this may be relatively acute. I felt that an attempt at opening the LAD would be worthwhile. The sheath was changed out for a 6 [place] system. He was given a bolus of Angiomax and a drip was started. When the ACT was greater than 200, I passed a Cougar IC wire down into the  proximal vessel. The total occusion was just at the Diagonal branch, making this an unfavorable location for PCI. The Cougar IC wire was easily passed into the Diagonal branch. I then used multiple wires in an attempt to cross the total occlusion in the mid LAD. These wires included the Cougar, Whisper, PT2, [employer]. I also used an OTW balloon and long wires in an attempt to angulate the wire and cross the total occlusion. At one point, the wire crossed into a large septal perforator and the 2.5 x 15 mm balloon was inflated at low atmospheres twice in the mid LAD and just down into the large proximal segment of the septal perforator. After balloon angioplasty, it was obvious that this vessel was not the LAD. The wire was retracted and the balloon and wire was redirected down the mid LAD. The septal perforator had good flow. After many failed attempts at crossing the total occlusion of the mid LAD, I stopped the case and removed all wires and the guiding catheter. There was no dissection or change in flow in the left coronary system.   The sheath was removed from the right radial artery and a Terumo hemostasis band was applied at the arteriotomy site on the right wrist.   There were no immediate complications. The patient was taken to the recovery area in stable condition.   Hemodynamic Findings: Central aortic pressure: 92/53 Left ventricular pressure: 9569  Angiographic Findings:  Left main:  Diffuse calcification with mild diffuse plaque.   Left Anterior Descending Artery: Large caliber vessel that courses to the apex. There is a large caliber septal perforating branch. The proximal vessel has diffuse 40% stenosis. The mid vessel has an aneurysmal segment followed by a total occlusion of the mid LAD. The moderate sized diagonal branch arises just at the occlusion of the mid LAD. The diagonal branch has an ostial 40% stenosis and mild plaque in mid vessel. The mid and distal LAD fills slowly from  left to left collaterals.   Circumflex Artery: Large caliber vessel. The first OM branch is moderate sized and has an ostial 90% stenosis. The second OM is moderate sized and has 40% proximal stenosis. The AV groove Circumflex has mild plaque disease but no flow limiting lesions.   Right Coronary Artery:  Large dominant vessel with 60% serial lesions in the proximal vessel. The mid vessel has a stented segment with 100% occlusion within the stented segment. The mid and distal vessel fills from left to right collaterals.   Left Ventricular Angiogram: No LV gram. (LVEF preserved by Echo)  Impression: 1. Severe triple vessel CAD 2. NSTEMI 3. Preserved LV systolic function 4. Unsuccessful attempt at angioplasty of the occluded mid LAD  Recommendations: Will hold Plavix. Will restart heparin gtt 6 hours post sheath pull. Will continue beta blocker, ASA and statin. Will consult CT surgery for possible CABG given his multivessel disease with failed attempt at angioplasty.        Complications:  None. The patient tolerated the procedure well.

## 2011-10-28 NOTE — H&P (View-Only) (Signed)
Subjective:   Jesus Maldonado is a 78 yr old man with HTN, HL and CAD with a remote MI s/p PCI with "2 stents" about 10-15 yrs ago in [place]. He was admitted last night (4/19) with NSTEMI.  Remains pain free on heparin, NTG, ASA, plavix, lopressor and atorvastatin. No bleeding on heparin.   Echo yesterday ejection fraction was in the range of 60% to 65%. Possible mild hypokinesis of the mid-distal anteroseptal myocardium.  Was started on Plavix on admit with 300mg  load and now 75mg  per day.    Intake/Output Summary (Last 24 hours) at 10/27/11 0958 Last data filed at 10/27/11 0900  Gross per 24 hour  Intake 1081.17 ml  Output   2250 ml  Net -1168.83 ml    Current meds:    . aspirin  325 mg Oral Daily  . atorvastatin  40 mg Oral QHS  . clopidogrel  75 mg Oral Q breakfast  . metoprolol  50 mg Oral BID  . pantoprazole  40 mg Oral Q1200  . ramipril  2.5 mg Oral Daily  . zolpidem  2.5 mg Oral Once   Infusions:    . sodium chloride 10 mL/hr at 10/25/11 2200  . heparin 1,300 Units/hr (10/26/11 1005)  . nitroGLYCERIN 5 mcg/min (10/25/11 1935)     Objective:  Blood pressure 94/61, pulse 79, temperature 98.4 F (36.9 C), temperature source Oral, resp. rate 17, height 5\' 10"  (1.778 m), weight 108.4 kg (238 lb 15.7 oz), SpO2 93.00%. Weight change:   Physical Exam: General:  Well appearing. No resp difficulty HEENT: normal Neck: supple. JVP unable to see . Carotids 2+ bilat; no bruits. No lymphadenopathy or thryomegaly appreciated. Cor: PMI non palpable. HS distant  Regular rate & rhythm. No rubs, gallops or murmurs. Lungs: clear Abdomen: soft, obese nontender, nondistended Good bowel sounds. Extremities: no cyanosis, clubbing, rash, edema Neuro: alert & orientedx3, cranial nerves grossly intact. moves all 4 extremities w/o difficulty. Affect pleasant  Telemetry: SR 60s  Lab Results: Basic Metabolic Panel:  Lab 10/26/11 1610 10/26/11 0205 10/25/11 1719  NA 135 137 136    K 4.3 3.6 --  CL 101 102 100  CO2 22 27 27   GLUCOSE 110* 115* 105*  BUN 10 11 13   CREATININE 1.12 1.22 1.33  CALCIUM 9.3 9.2 10.1  MG -- 2.2 --  PHOS -- -- --   Liver Function Tests: No results found for this basename: AST:5,ALT:5,ALKPHOS:5,BILITOT:5,PROT:5,ALBUMIN:5 in the last 168 hours No results found for this basename: LIPASE:5,AMYLASE:5 in the last 168 hours No results found for this basename: AMMONIA:5 in the last 168 hours CBC:  Lab 10/27/11 0625 10/26/11 0832 10/26/11 0205 10/25/11 1719  WBC 10.6* 12.4* 10.2 9.7  NEUTROABS -- -- -- 6.3  HGB 14.9 15.4 14.6 15.4  HCT 43.9 44.3 42.7 46.2  MCV 88.9 89.5 88.8 89.7  PLT 238 267 253 273   Cardiac Enzymes:  Lab 10/26/11 1429 10/26/11 0832 10/26/11 0149 10/25/11 1731  CKTOTAL 521* 609* 548* 294*  CKMB 32.9* 43.9* 42.0* 17.7*  CKMBINDEX -- -- -- --  TROPONINI 6.61* 4.44* 2.88* 0.80*   BNP: No components found with this basename: POCBNP:5 CBG: No results found for this basename: GLUCAP:5 in the last 168 hours Microbiology: No results found for this basename: cult   No results found for this basename: CULT:2,SDES:2 in the last 168 hours  Imaging: Dg Chest Portable 1 View  10/25/2011  *RADIOLOGY REPORT*  Clinical Data: Chest and left arm pain.  PORTABLE  CHEST - 1 VIEW  Comparison: None.  Findings: Numerous leads and wires project over the chest.  Midline trachea.  Normal heart size.  Mildly tortuous thoracic aorta. Artifactual increased density about the left hemithorax. No pleural effusion or pneumothorax.  Probable vessel on end projecting in the right suprahilar region.  Lungs otherwise clear.  IMPRESSION:  1. No acute cardiopulmonary disease. 2.  Probable vessel on end projecting over the right suprahilar region.  If possible, PA and lateral radiographs should be performed to exclude pulmonary nodule.  Original Report Authenticated By: Consuello Bossier, M.D.     ASSESSMENT:  1. NSTEMI 2. CAD s/p remote stents x 2 3.  HTN 4. HL 5. Obesity  PLAN/DISCUSSION:  Stable. No further CP. Will need cath tomorrow. EF preserved by echo. Continue current regimen. Discussed with patient and family. Place cardiac rehab consult. Cath orders written.  Was started on Plavix on admit with 300mg  load and now 75mg  per day. Will check P2Y12. If platelets not inhibited significantly consider switch to Effient or Brillinta.   LOS: 2 days    Arvilla Meres, MD 10/27/2011, 9:58 AM

## 2011-10-28 NOTE — Progress Notes (Signed)
BACK FROM THE CATH. LAB AWAKE AND ALERT, TR BAND TO RIGHT WRIST INTACT. ELEVATED WITH PILLOW , WARM TO TOUCH. INSTRUCTED TO CALL FOR ANY SIGNS OF BLEEDING, PAIN OR NUMBNESS.

## 2011-10-28 NOTE — Progress Notes (Signed)
Cardiac Rehab [phone] Noted pt for TCTS consult. Please address activity if pt to ambulate.Maiko Salais DunlapRN

## 2011-10-28 NOTE — Progress Notes (Signed)
TO CATH LAB BY BED, STABLE.

## 2011-10-28 NOTE — Consult Note (Signed)
Reason for Consult:3 Vessel CAD, s/p NQWMI Referring Physician: Dr. Providence Lanius Jesus Maldonado is an 78 y.o. male.  HPI: [name] with multiple CRF and a history of CAD treated with PTCA/ stenting presents with cc/o CP.  History of CAD. He had 2 stents placed about 10 yrs ago in [place], [place]. He had done well from a cardica standpoint since that time. Moved to [place] about 6 months ago to be close to family. He first noted CP 2 weeks ago. Came on at rest and resolved spontaneously. He was awakened from sleep at 3 AM on 4/19 with severe pain originating on his left chest and radiating around the chest as well as down the arm. He experienced diaphoresis and shortness of breath. He took some old SL NTG without relief and then developed nausea and vomiting. His daughter spoke with him the next morning and knew he was not feeling well. She took him to [facility]. He had a positive troponin. He was loded with Plavix and treated with aspirin, heparin and nitroglycerin.  Past Medical History  Diagnosis Date  . Myocardial infarct   . Hypertension   . Hypercholesteremia   . Arthritis   . Coronary artery disease     s/p PCI 10 yrs ago.    Past Surgical History  Procedure Date  . Cardiac stents   . Hemorroidectomy     History reviewed. No pertinent family history.  Social History:  reports that he has quit smoking. He does not have any smokeless tobacco history on file. He reports that he does not drink alcohol or use illicit drugs.  Allergies: No Known Allergies  Medications:  I have reviewed the patient's current medications. Prior to Admission:  Prescriptions prior to admission  Medication Sig Dispense Refill  . aspirin 325 MG tablet Take 325 mg by mouth daily.      Marland Kitchen atorvastatin (LIPITOR) 40 MG tablet Take 40 mg by mouth at bedtime.      . cimetidine (TAGAMET) 800 MG tablet Take 800 mg by mouth 2 (two) times daily.      . metoprolol (LOPRESSOR) 50 MG tablet Take 50 mg by mouth 2 (two) times  daily.      . nitroGLYCERIN (NITROSTAT) 0.4 MG SL tablet Place 0.4 mg under the tongue every 5 (five) minutes as needed.      . ramipril (ALTACE) 2.5 MG capsule Take 2.5 mg by mouth daily.        Results for orders placed during the hospital encounter of 10/25/11 (from the past 48 hour(s))  HEPARIN LEVEL (UNFRACTIONATED)     Status: Normal   Collection Time   10/26/11  8:14 PM      Component Value Range Comment   Heparin Unfractionated 0.45  0.30 - 0.70 (IU/mL)   CBC     Status: Abnormal   Collection Time   10/27/11  6:25 AM      Component Value Range Comment   WBC 10.6 (*) 4.0 - 10.5 (K/uL)    RBC 4.94  4.22 - 5.81 (MIL/uL)    Hemoglobin 14.9  13.0 - 17.0 (g/dL)    HCT 16.1  09.6 - 04.5 (%)    MCV 88.9  78.0 - 100.0 (fL)    MCH 30.2  26.0 - 34.0 (pg)    MCHC 33.9  30.0 - 36.0 (g/dL)    RDW 40.9  81.1 - 91.4 (%)    Platelets 238  150 - 400 (K/uL)   HEPARIN  LEVEL (UNFRACTIONATED)     Status: Normal   Collection Time   10/27/11  6:25 AM      Component Value Range Comment   Heparin Unfractionated 0.44  0.30 - 0.70 (IU/mL)   HEMOGLOBIN A1C     Status: Abnormal   Collection Time   10/27/11  6:25 AM      Component Value Range Comment   Hemoglobin A1C 5.8 (*) <5.7 (%)    Mean Plasma Glucose 120 (*) <117 (mg/dL)   PLATELET INHIBITION P2Y12     Status: Normal   Collection Time   10/27/11 10:54 AM      Component Value Range Comment   Platelet Function  P2Y12 195  194 - 418 (PRU)   CBC     Status: Normal   Collection Time   10/28/11  5:25 AM      Component Value Range Comment   WBC 8.6  4.0 - 10.5 (K/uL)    RBC 4.71  4.22 - 5.81 (MIL/uL)    Hemoglobin 14.2  13.0 - 17.0 (g/dL)    HCT 16.1  09.6 - 04.5 (%)    MCV 89.2  78.0 - 100.0 (fL)    MCH 30.1  26.0 - 34.0 (pg)    MCHC 33.8  30.0 - 36.0 (g/dL)    RDW 40.9  81.1 - 91.4 (%)    Platelets 234  150 - 400 (K/uL)   HEPARIN LEVEL (UNFRACTIONATED)     Status: Normal   Collection Time   10/28/11  5:25 AM      Component Value Range  Comment   Heparin Unfractionated 0.43  0.30 - 0.70 (IU/mL)   POCT ACTIVATED CLOTTING TIME     Status: Normal   Collection Time   10/28/11  8:12 AM      Component Value Range Comment   Activated Clotting Time 490       No results found.  Review of Systems  Constitutional: Positive for malaise/fatigue and diaphoresis. Negative for fever, chills and weight loss.  Respiratory: Negative for cough.   Cardiovascular: Positive for chest pain.  Gastrointestinal: Positive for heartburn, nausea and vomiting.  Musculoskeletal: Positive for joint pain.  Neurological: Positive for dizziness. Negative for weakness.  All other systems reviewed and are negative.   Blood pressure 96/52, pulse 75, temperature 98 F (36.7 C), temperature source Oral, resp. rate 16, height 5\' 10"  (1.778 m), weight 236 lb 8.9 oz (107.3 kg), SpO2 96.00%. Physical Exam  Vitals reviewed. Constitutional: He is oriented to person, place, and time. He appears well-developed and well-nourished. No distress.  HENT:  Head: Normocephalic and atraumatic.  Eyes: EOM are normal. Pupils are equal, round, and reactive to light.  Neck: Neck supple. No JVD present. No thyromegaly present.  Cardiovascular: Normal rate, regular rhythm and normal heart sounds.  Exam reveals no gallop and no friction rub.   No murmur heard. Respiratory: Effort normal and breath sounds normal.  GI: Soft. There is no tenderness.  Musculoskeletal: He exhibits no edema.  Lymphadenopathy:    He has no cervical adenopathy.  Neurological: He is alert and oriented to person, place, and time. No cranial nerve deficit.  Skin: Skin is warm and dry.   Cardiac cath 10/28/11 Angiographic Findings:  Left main: Diffuse calcification with mild diffuse plaque.  Left Anterior Descending Artery: Large caliber vessel that courses to the apex. There is a large caliber septal perforating branch. The proximal vessel has diffuse 40% stenosis. The mid vessel has an aneurysmal  segment followed by a total occlusion of the mid LAD. The moderate sized diagonal branch arises just at the occlusion of the mid LAD. The diagonal branch has an ostial 40% stenosis and mild plaque in mid vessel. The mid and distal LAD fills slowly from left to left collaterals.  Circumflex Artery: Large caliber vessel. The first OM branch is moderate sized and has an ostial 90% stenosis. The second OM is moderate sized and has 40% proximal stenosis. The AV groove Circumflex has mild plaque disease but no flow limiting lesions.  Right Coronary Artery: Large dominant vessel with 60% serial lesions in the proximal vessel. The mid vessel has a stented segment with 100% occlusion within the stented segment. The mid and distal vessel fills from left to right collaterals.  Left Ventricular Angiogram: No LV gram. (LVEF preserved by Echo)  Impression:  1. Severe triple vessel CAD  2. NSTEMI  3. Preserved LV systolic function  4. Unsuccessful attempt at angioplasty of the occluded mid LAD   Assessment/Plan: [name] with a NQWMI is found to have severe 3 vessel CAD. CABG indicated for survival benefit and relief of symptoms.  I have discussed with the patient and his daughter the general nature of the procedure, need for general anesthesia, and incisions to be used. I have discussed the expected hospital stay, overall recovery and short and long term outcomes. They understand the risks include but are not limited to death, stroke, MI, DVT/PE, bleeding, possible need for transfusion, infections, and other organ system dysfunction including respiratory, renal, or GI complications. He understands and accepts these risks and agrees to proceed.  He was loaded with plavix. We need to allow for plavix washout prior to surgery to lessen his risk for associated bleeding complications. I think we should be safe to proceed by Friday 11/01/11.  Jakki Doughty C 10/28/2011, 6:50 PM

## 2011-10-28 NOTE — Progress Notes (Signed)
UR Completed. Simmons, Mabelle Mungin F [phone]  

## 2011-10-28 NOTE — Progress Notes (Signed)
ANTICOAGULATION CONSULT NOTE - Follow Up Consult  Pharmacy Consult for Heparin Indication: chest pain/ACS  No Known Allergies  Patient Measurements: Height: 5\' 10"  (177.8 cm) Weight: 236 lb 8.9 oz (107.3 kg) IBW/kg (Calculated) : 73  Heparin dosing weight: 96.4 kg  Vital Signs: Temp: 98.5 F (36.9 C) (04/22 0330) Temp src: Oral (04/22 0330) BP: 108/58 mmHg (04/22 0330) Pulse Rate: 79  (04/21 2243)  Labs:  Alvira Philips 10/28/11 0525 10/27/11 0625 10/26/11 2014 10/26/11 1429 10/26/11 0832 10/26/11 0205 10/26/11 0149 10/25/11 1719  HGB 14.2 14.9 -- -- -- -- -- --  HCT 42.0 43.9 -- -- 44.3 -- -- --  PLT 234 238 -- -- 267 -- -- --  APTT -- -- -- -- -- -- -- --  LABPROT -- -- -- -- -- 13.2 -- --  INR -- -- -- -- -- 0.98 -- --  HEPARINUNFRC 0.43 0.44 0.45 -- -- -- -- --  CREATININE -- -- -- -- 1.12 1.22 -- 1.33  CKTOTAL -- -- -- 521* 609* -- 548* --  CKMB -- -- -- 32.9* 43.9* -- 42.0* --  TROPONINI -- -- -- 6.61* 4.44* -- 2.88* --   Estimated Creatinine Clearance: 66.7 ml/min (by C-G formula based on Cr of 1.12).   Assessment: 78yo M being managed on heparin for Chest pain/ACS with plans for Cath today per MD notes. Now within goal range (HL = 0.43 units/ml) at rate of 1300 units/hr. CBC stable. No bleeding or issues noted by RN or in MD notes.  Goal of Therapy:  Heparin level 0.3-0.7   Plan:  - IV heparin 1300 units/hr on hold for cardiac cath today - Bivalirudin 37.6 mL/hr ordered for cardiac cath procedure   - Follow-up anticoagulation plans post cath   Su Hilt, PharmD Candidate  I have reviewed assessment and plan for patient and agree.  Nadara Mustard, PharmD., MS Clinical Pharmacist Pager:  [phone] Thank you for allowing pharmacy to be part of this patients care team.

## 2011-10-29 ENCOUNTER — Inpatient Hospital Stay (HOSPITAL_COMMUNITY): Payer: Medicare Other

## 2011-10-29 DIAGNOSIS — Z01810 Encounter for preprocedural cardiovascular examination: Secondary | ICD-10-CM

## 2011-10-29 LAB — CBC
Hemoglobin: 14.1 g/dL (ref 13.0–17.0)
MCV: 88.5 fL (ref 78.0–100.0)
Platelets: 242 10*3/uL (ref 150–400)
RBC: 4.62 MIL/uL (ref 4.22–5.81)
WBC: 8.6 10*3/uL (ref 4.0–10.5)

## 2011-10-29 LAB — PULMONARY FUNCTION TEST

## 2011-10-29 MED ORDER — HEPARIN (PORCINE) IN NACL 100-0.45 UNIT/ML-% IJ SOLN
1300.0000 [IU]/h | INTRAMUSCULAR | Status: DC
  Administered 2011-10-29: 1300 [IU]/h via INTRAVENOUS
  Filled 2011-10-29 (×2): qty 250

## 2011-10-29 MED ORDER — LORATADINE 10 MG PO TABS
10.0000 mg | ORAL_TABLET | Freq: Every day | ORAL | Status: DC | PRN
  Administered 2011-10-30 – 2011-10-31 (×2): 10 mg via ORAL
  Filled 2011-10-29 (×3): qty 1

## 2011-10-29 MED ORDER — HEPARIN (PORCINE) IN NACL 100-0.45 UNIT/ML-% IJ SOLN
1350.0000 [IU]/h | INTRAMUSCULAR | Status: DC
  Administered 2011-10-30 – 2011-10-31 (×3): 1350 [IU]/h via INTRAVENOUS
  Filled 2011-10-29 (×5): qty 250

## 2011-10-29 MED FILL — Nicardipine HCl IV Soln 2.5 MG/ML: INTRAVENOUS | Qty: 1 | Status: AC

## 2011-10-29 MED FILL — Dextrose Inj 5%: INTRAVENOUS | Qty: 50 | Status: AC

## 2011-10-29 NOTE — Progress Notes (Signed)
Pre-op Cardiac Surgery  Carotid Findings:  Bilaterally no significant ICA stenosis with antegrade vertebral flow.  Upper Extremity Right Left  Brachial Pressures 105 109  Radial Waveforms Tri Tri  Ulnar Waveforms Tri Tri  Palmar Arch (Allen's Test) Normal Normal with radial compression, obliterate with ulnar comrpession   Palpable pedal pulses.    Sonographer: Farrel Demark, RDMS

## 2011-10-29 NOTE — Progress Notes (Signed)
ANTICOAGULATION CONSULT NOTE - Follow Up Consult  Pharmacy Consult for Heparin Indication: chest pain/ACS  No Known Allergies  Patient Measurements: Height: 5\' 10"  (177.8 cm) Weight: 236 lb 8.9 oz (107.3 kg) IBW/kg (Calculated) : 73  Heparin dosing weight: 96.4 kg  Vital Signs: Temp: 98.3 F (36.8 C) (04/23 0500) Temp src: Oral (04/23 0500) BP: 98/56 mmHg (04/23 0755) Pulse Rate: 75  (04/23 0755)  Labs:  Alvira Philips 10/29/11 0515 10/28/11 0525 10/27/11 0625 10/26/11 2014 10/26/11 1429 10/26/11 0832  HGB 14.1 14.2 -- -- -- --  HCT 40.9 42.0 43.9 -- -- --  PLT 242 234 238 -- -- --  APTT -- -- -- -- -- --  LABPROT -- -- -- -- -- --  INR -- -- -- -- -- --  HEPARINUNFRC -- 0.43 0.44 0.45 -- --  CREATININE -- -- -- -- -- 1.12  CKTOTAL -- -- -- -- 521* 609*  CKMB -- -- -- -- 32.9* 43.9*  TROPONINI -- -- -- -- 6.61* 4.44*   Estimated Creatinine Clearance: 66.7 ml/min (by C-G formula based on Cr of 1.12).   Assessment: 78yo M being managed on heparin for Chest pain/ACS with plans now for CABG after unsuccessful angioplasty.  Heparin resumed while awaiting surgery (tentatively Friday) of this week.  He had been therapeutic on an IV heparin rate of 1300 units/hr.  Will plan to restart at this rate and check a level to ensure therapeutic response.  Goal of Therapy:  Heparin level 0.3-0.7   Plan:  - IV heparin 1300 units/hr  - Obtain a heparin level 8 hours after restart   - Monitor s/s of bleeding and ongoing anticoagulation needs.   Nadara Mustard, PharmD., MS Clinical Pharmacist Pager:  [phone] Thank you for allowing pharmacy to be part of this patients care team.

## 2011-10-29 NOTE — Progress Notes (Signed)
Patient ID: Jesus Maldonado, male   DOB: Nov 30, 1930, 78 y.o.   MRN: [id number] C/o itching and watering of eyes  BP 112/59  Pulse 77  Temp(Src) 97.7 F (36.5 C) (Oral)  Resp 18  Ht 5\' 10"  (1.778 m)  Wt 236 lb 8.9 oz (107.3 kg)  BMI 33.94 kg/m2  SpO2 95%  Exam unchanged  Carotids OK  For CABG Friday  Claritin ordered for allergies

## 2011-10-29 NOTE — Progress Notes (Signed)
ANTICOAGULATION CONSULT NOTE - Follow Up Consult   Pharmacy Consult for Heparin Indication: chest pain/ACS   HL = 0.31 (goal 0.3 - 0.7 units/mL) Heparin weight = 96 kg   Assessment: 80 YOM on heparin for ACS, awaiting CABG s/p unsuccessful angioplasty.  Heparin level therapeutic but toward the low end of goal and has been trending down.  Heparin infusing without complications, no bleeding documented.   Plan: - Increase heparin gtt to 1350 units/hr - F/U  AM labs    Shaquana Buel D. Laney Potash, PharmD, BCPS Pager:  [phone] 10/29/2011, 6:41 PM

## 2011-10-29 NOTE — Progress Notes (Addendum)
    SUBJECTIVE: No chest pain or SOB overnight. No events.   BP 97/48  Pulse 70  Temp(Src) 98.3 F (36.8 C) (Oral)  Resp 18  Ht 5\' 10"  (1.778 m)  Wt 236 lb 8.9 oz (107.3 kg)  BMI 33.94 kg/m2  SpO2 93%  Intake/Output Summary (Last 24 hours) at 10/29/11 1610 Last data filed at 10/29/11 0500  Gross per 24 hour  Intake   1100 ml  Output    877 ml  Net    223 ml    PHYSICAL EXAM General: Well developed, well nourished, in no acute distress. Alert and oriented x 3.  Psych:  Good affect, responds appropriately Neck: No JVD. No masses noted.  Lungs: Clear bilaterally with no wheezes or rhonci noted.  Heart: RRR with no murmurs noted. Abdomen: Bowel sounds are present. Soft, non-tender.  Extremities: No lower extremity edema. Right wrist cath site without hematoma. Right radial pulse is normal.   LABS: Basic Metabolic Panel:  Basename 10/26/11 0832  NA 135  K 4.3  CL 101  CO2 22  GLUCOSE 110*  BUN 10  CREATININE 1.12  CALCIUM 9.3  MG --  PHOS --   CBC:  Basename 10/29/11 0515 10/28/11 0525  WBC 8.6 8.6  NEUTROABS -- --  HGB 14.1 14.2  HCT 40.9 42.0  MCV 88.5 89.2  PLT 242 234   Cardiac Enzymes:  Basename 10/26/11 1429 10/26/11 0832  CKTOTAL 521* 609*  CKMB 32.9* 43.9*  CKMBINDEX -- --  TROPONINI 6.61* 4.44*     Current Meds:    . aspirin  325 mg Oral Daily  . atorvastatin  80 mg Oral QHS  . bivalirudin      . fentaNYL      . heparin      . heparin      . lidocaine      . metoprolol  50 mg Oral BID  . midazolam      . nitroGLYCERIN      . pantoprazole  40 mg Oral Q1200  . ramipril  2.5 mg Oral Daily  . DISCONTD: atorvastatin  40 mg Oral QHS  . DISCONTD: clopidogrel  75 mg Oral Q breakfast  . DISCONTD: sodium chloride  3 mL Intravenous Q12H     ASSESSMENT AND PLAN:  1. NSTEMI: Pt found by cath yesterday to have triple vessel CAD with totally occluded RCA and LAD and high grade disease in ostium of OM1. He has been evaluated by Dr.  Dorris Fetch with CT surgery and plans are being made for CABG on Friday of this week. He had been loaded with Plavix so will hold for washout.  Will continue ASA, statin, beta blocker, Ace-inh. Will start heparin drip.   2. HTN: Well controlled.   3. Hyperlipidemia: on statin.  4. Dispo: Awaiting CABG on Friday. He has had no recurrent chest pain. Will transfer to telemetry while awaiting surgery later this week.   Jesus Maldonado  4/23/20137:33 AM

## 2011-10-30 DIAGNOSIS — I2510 Atherosclerotic heart disease of native coronary artery without angina pectoris: Secondary | ICD-10-CM

## 2011-10-30 LAB — BASIC METABOLIC PANEL
BUN: 11 mg/dL (ref 6–23)
CO2: 25 mEq/L (ref 19–32)
Calcium: 9.4 mg/dL (ref 8.4–10.5)
GFR calc non Af Amer: 59 mL/min — ABNORMAL LOW (ref >90)
Glucose, Bld: 99 mg/dL (ref 70–99)

## 2011-10-30 LAB — CBC
HCT: 43.1 % (ref 39.0–52.0)
Hemoglobin: 14.1 g/dL (ref 13.0–17.0)
MCH: 29.6 pg (ref 26.0–34.0)
MCHC: 32.7 g/dL (ref 30.0–36.0)
MCV: 90.5 fL (ref 78.0–100.0)
RBC: 4.76 MIL/uL (ref 4.22–5.81)

## 2011-10-30 MED ORDER — MAGNESIUM HYDROXIDE 400 MG/5ML PO SUSP
30.0000 mL | Freq: Every day | ORAL | Status: DC | PRN
  Administered 2011-10-30: 30 mL via ORAL
  Filled 2011-10-30: qty 30

## 2011-10-30 MED ORDER — POLYETHYLENE GLYCOL 3350 17 G PO PACK
17.0000 g | PACK | Freq: Every day | ORAL | Status: DC | PRN
  Filled 2011-10-30: qty 1

## 2011-10-30 MED ORDER — NAPHAZOLINE HCL 0.1 % OP SOLN
1.0000 [drp] | Freq: Four times a day (QID) | OPHTHALMIC | Status: DC | PRN
  Filled 2011-10-30: qty 15

## 2011-10-30 NOTE — Plan of Care (Signed)
Problem: Phase I Progression Outcomes Goal: Vascular site scale level 0 - I Vascular Site Scale Level 0: No bruising/bleeding/hematoma Level I (Mild): Bruising/Ecchymosis, minimal bleeding/ooozing, palpable hematoma < 3 cm Level II (Moderate): Bleeding not affecting hemodynamic parameters, pseudoaneurysm, palpable hematoma > 3 cm  Outcome: Completed/Met Date Met:  10/30/11 Level 0

## 2011-10-30 NOTE — Progress Notes (Signed)
    SUBJECTIVE: No chest pain or SOB this am. He has no bleeding on heparin.   BP 103/65  Pulse 76  Temp(Src) 97.2 F (36.2 C) (Oral)  Resp 18  Ht 5\' 10"  (1.778 m)  Wt 236 lb 8.9 oz (107.3 kg)  BMI 33.94 kg/m2  SpO2 90%  Intake/Output Summary (Last 24 hours) at 10/30/11 4098 Last data filed at 10/29/11 1700  Gross per 24 hour  Intake    720 ml  Output    250 ml  Net    470 ml    PHYSICAL EXAM General: Well developed, well nourished, in no acute distress. Alert and oriented x 3.  Psych:  Good affect, responds appropriately Neck: No JVD. No masses noted.  Lungs: Clear bilaterally with no wheezes or rhonci noted.  Heart: RRR with no murmurs noted. Abdomen: Bowel sounds are present. Soft, non-tender.  Extremities: No lower extremity edema.   LABS: Basic Metabolic Panel:  Basename 10/30/11 0550  NA 135  K 4.2  CL 101  CO2 25  GLUCOSE 99  BUN 11  CREATININE 1.15  CALCIUM 9.4  MG --  PHOS --   CBC:  Basename 10/30/11 0550 10/29/11 0515  WBC 7.6 8.6  NEUTROABS -- --  HGB 14.1 14.1  HCT 43.1 40.9  MCV 90.5 88.5  PLT 254 242    Current Meds:    . aspirin  325 mg Oral Daily  . atorvastatin  80 mg Oral QHS  . metoprolol  50 mg Oral BID  . pantoprazole  40 mg Oral Q1200  . ramipril  2.5 mg Oral Daily     ASSESSMENT AND PLAN:  1. NSTEMI: Pt found by cath 10/28/11 to have triple vessel CAD with totally occluded RCA and LAD and high grade disease in ostium of OM1. He has been evaluated by Dr. Dorris Fetch with CT surgery and plans are being made for CABG on Friday of this week. He had been loaded with Plavix so will hold for washout. Will continue ASA, statin, beta blocker, Ace-inh, heparin drip.   2. HTN: Well controlled.   3. Hyperlipidemia: on statin.   4. Dispo: Awaiting CABG on Friday. He has had no recurrent chest pain.     Amani Nodarse  4/24/20137:22 AM

## 2011-10-30 NOTE — Progress Notes (Signed)
ANTICOAGULATION CONSULT NOTE - Follow Up Consult  Pharmacy Consult for Heparin Indication: chest pain/ACS  No Known Allergies  Patient Measurements: Height: 5\' 10"  (177.8 cm) Weight: 236 lb 8.9 oz (107.3 kg) IBW/kg (Calculated) : 73  Heparin dosing weight: 96.4 kg  Vital Signs: Temp: 97.2 F (36.2 C) (04/24 0342) Temp src: Oral (04/24 0342) BP: 103/65 mmHg (04/24 0342) Pulse Rate: 76  (04/24 0342)  Labs:  Jesus Maldonado 10/30/11 0906 10/30/11 0550 10/29/11 1709 10/29/11 0515 10/28/11 0525  HGB -- 14.1 -- 14.1 --  HCT -- 43.1 -- 40.9 42.0  PLT -- 254 -- 242 234  APTT -- -- -- -- --  LABPROT -- -- -- -- --  INR -- -- -- -- --  HEPARINUNFRC 0.39 -- 0.31 -- 0.43  CREATININE -- 1.15 -- -- --  CKTOTAL -- -- -- -- --  CKMB -- -- -- -- --  TROPONINI -- -- -- -- --   Estimated Creatinine Clearance: 64.9 ml/min (by C-G formula based on Cr of 1.15).   Assessment: 78yo M being managed on heparin for Chest pain/ACS with plans now for CABG (tentatively Friday) of this week.  Anti-Xa level therapeutic on 1350 units/hr. Hgb/Plt are stable, no bleeding per chart  Goal of Therapy:  Heparin level 0.3-0.7   Plan:  - Continue heparin 1350 units/hr  - Monitor s/s of bleeding and ongoing anticoagulation needs.   Bayard Hugger, PharmD, BCPS  Clinical Pharmacist  Pager: [phone]

## 2011-10-31 ENCOUNTER — Inpatient Hospital Stay (HOSPITAL_COMMUNITY): Payer: Medicare Other

## 2011-10-31 LAB — URINALYSIS, ROUTINE W REFLEX MICROSCOPIC
Ketones, ur: NEGATIVE mg/dL
Leukocytes, UA: NEGATIVE
Nitrite: NEGATIVE
Protein, ur: NEGATIVE mg/dL
Urobilinogen, UA: 0.2 mg/dL (ref 0.0–1.0)

## 2011-10-31 LAB — CBC
HCT: 42.6 % (ref 39.0–52.0)
MCH: 29.7 pg (ref 26.0–34.0)
MCV: 90.4 fL (ref 78.0–100.0)
Platelets: 275 10*3/uL (ref 150–400)
RDW: 13.8 % (ref 11.5–15.5)
WBC: 7.5 10*3/uL (ref 4.0–10.5)

## 2011-10-31 LAB — BLOOD GAS, ARTERIAL
Acid-Base Excess: 1.2 mmol/L (ref 0.0–2.0)
Bicarbonate: 25 mEq/L — ABNORMAL HIGH (ref 20.0–24.0)
Drawn by: 31297
FIO2: 0.21 %
O2 Saturation: 96.8 %
Patient temperature: 98.6

## 2011-10-31 LAB — TYPE AND SCREEN: Antibody Screen: NEGATIVE

## 2011-10-31 MED ORDER — SODIUM CHLORIDE 0.9 % IV SOLN
INTRAVENOUS | Status: DC
  Filled 2011-10-31: qty 1

## 2011-10-31 MED ORDER — LACTATED RINGERS IV SOLN: INTRAVENOUS | Status: DC

## 2011-10-31 MED ORDER — TRANEXAMIC ACID 100 MG/ML IV SOLN
1.5000 mg/kg/h | INTRAVENOUS | Status: DC
  Filled 2011-10-31: qty 25

## 2011-10-31 MED ORDER — NITROGLYCERIN IN D5W 200-5 MCG/ML-% IV SOLN
2.0000 ug/min | INTRAVENOUS | Status: DC
  Filled 2011-10-31: qty 250

## 2011-10-31 MED ORDER — CHLORHEXIDINE GLUCONATE 4 % EX LIQD
60.0000 mL | Freq: Once | CUTANEOUS | Status: AC
  Administered 2011-11-01: 4 via TOPICAL
  Filled 2011-10-31 (×2): qty 60

## 2011-10-31 MED ORDER — TRANEXAMIC ACID (OHS) PUMP PRIME SOLUTION
2.0000 mg/kg | INTRAVENOUS | Status: DC
  Filled 2011-10-31: qty 2.12

## 2011-10-31 MED ORDER — POTASSIUM CHLORIDE 2 MEQ/ML IV SOLN
80.0000 meq | INTRAVENOUS | Status: DC
  Filled 2011-10-31: qty 40

## 2011-10-31 MED ORDER — TEMAZEPAM 15 MG PO CAPS: 15.0000 mg | ORAL_CAPSULE | Freq: Once | ORAL | Status: DC | PRN

## 2011-10-31 MED ORDER — MAGNESIUM SULFATE 50 % IJ SOLN
40.0000 meq | INTRAMUSCULAR | Status: DC
  Filled 2011-10-31: qty 10

## 2011-10-31 MED ORDER — MIDAZOLAM HCL 2 MG/2ML IJ SOLN: 1.0000 mg | INTRAMUSCULAR | Status: DC | PRN

## 2011-10-31 MED ORDER — ALPRAZOLAM 0.25 MG PO TABS: 0.2500 mg | ORAL_TABLET | ORAL | Status: DC | PRN

## 2011-10-31 MED ORDER — CHLORHEXIDINE GLUCONATE 4 % EX LIQD
60.0000 mL | Freq: Once | CUTANEOUS | Status: AC
  Administered 2011-10-31: 4 via TOPICAL
  Filled 2011-10-31: qty 60

## 2011-10-31 MED ORDER — FENTANYL CITRATE 0.05 MG/ML IJ SOLN: 50.0000 ug | INTRAMUSCULAR | Status: DC | PRN

## 2011-10-31 MED ORDER — EPINEPHRINE HCL 1 MG/ML IJ SOLN
0.5000 ug/min | INTRAVENOUS | Status: DC
  Filled 2011-10-31: qty 4

## 2011-10-31 MED ORDER — DEXTROSE 5 % IV SOLN
1.5000 g | INTRAVENOUS | Status: AC
  Administered 2011-11-01: .75 g via INTRAVENOUS
  Administered 2011-11-01: 1.5 g via INTRAVENOUS
  Filled 2011-10-31: qty 1.5

## 2011-10-31 MED ORDER — DIAZEPAM 2 MG PO TABS
2.0000 mg | ORAL_TABLET | Freq: Once | ORAL | Status: AC
  Administered 2011-11-01: 2 mg via ORAL
  Filled 2011-10-31: qty 1

## 2011-10-31 MED ORDER — CHLORHEXIDINE GLUCONATE 4 % EX LIQD
60.0000 mL | Freq: Once | CUTANEOUS | Status: AC
  Administered 2011-11-01: 4 via TOPICAL
  Filled 2011-10-31: qty 60

## 2011-10-31 MED ORDER — SODIUM BICARBONATE 8.4 % IV SOLN
INTRAVENOUS | Status: AC
  Administered 2011-11-01: 09:00:00
  Filled 2011-10-31 (×2): qty 2.5

## 2011-10-31 MED ORDER — DOPAMINE-DEXTROSE 3.2-5 MG/ML-% IV SOLN
2.0000 ug/kg/min | INTRAVENOUS | Status: DC
  Filled 2011-10-31: qty 250

## 2011-10-31 MED ORDER — DEXTROSE 5 % IV SOLN
750.0000 mg | INTRAVENOUS | Status: DC
  Filled 2011-10-31: qty 750

## 2011-10-31 MED ORDER — TRANEXAMIC ACID (OHS) BOLUS VIA INFUSION
15.0000 mg/kg | INTRAVENOUS | Status: AC
  Administered 2011-11-01: 1590 mg via INTRAVENOUS
  Filled 2011-10-31: qty 1590

## 2011-10-31 MED ORDER — BISACODYL 5 MG PO TBEC
5.0000 mg | DELAYED_RELEASE_TABLET | Freq: Once | ORAL | Status: AC
  Administered 2011-11-01: 5 mg via ORAL
  Filled 2011-10-31: qty 1

## 2011-10-31 MED ORDER — SODIUM CHLORIDE 0.9 % IV SOLN
0.1000 ug/kg/h | INTRAVENOUS | Status: AC
  Administered 2011-11-01: .2 ug/kg/h via INTRAVENOUS
  Filled 2011-10-31: qty 4

## 2011-10-31 MED ORDER — PHENYLEPHRINE HCL 10 MG/ML IJ SOLN
30.0000 ug/min | INTRAVENOUS | Status: DC
  Filled 2011-10-31: qty 2

## 2011-10-31 MED ORDER — METOPROLOL TARTRATE 12.5 MG HALF TABLET
12.5000 mg | ORAL_TABLET | Freq: Once | ORAL | Status: AC
  Administered 2011-11-01: 12.5 mg via ORAL
  Filled 2011-10-31 (×2): qty 1

## 2011-10-31 MED ORDER — VANCOMYCIN HCL 1000 MG IV SOLR
1500.0000 mg | INTRAVENOUS | Status: AC
  Administered 2011-11-01: 1500 mg via INTRAVENOUS
  Filled 2011-10-31: qty 1500

## 2011-10-31 NOTE — Progress Notes (Signed)
ANTICOAGULATION CONSULT NOTE - Follow Up Consult  Pharmacy Consult for Heparin Indication: chest pain/ACS  No Known Allergies  Patient Measurements: Height: 5\' 10"  (177.8 cm) Weight: 233 lb 11 oz (106 kg) IBW/kg (Calculated) : 73  Heparin dosing weight: 96.4 kg  Vital Signs: Temp: 97.7 F (36.5 C) (04/25 0435) Temp src: Oral (04/25 0435) BP: 112/71 mmHg (04/25 0435) Pulse Rate: 74  (04/25 0435)  Labs:  Basename 10/31/11 0610 10/30/11 0906 10/30/11 0550 10/29/11 1709 10/29/11 0515  HGB 14.0 -- 14.1 -- --  HCT 42.6 -- 43.1 -- 40.9  PLT 275 -- 254 -- 242  APTT -- -- -- -- --  LABPROT -- -- -- -- --  INR -- -- -- -- --  HEPARINUNFRC 0.41 0.39 -- 0.31 --  CREATININE -- -- 1.15 -- --  CKTOTAL -- -- -- -- --  CKMB -- -- -- -- --  TROPONINI -- -- -- -- --   Estimated Creatinine Clearance: 64.5 ml/min (by C-G formula based on Cr of 1.15).   Assessment: 78yo M being managed on heparin for Chest pain/ACS with plans now for CABG tomorrow.  Anti-Xa level therapeutic on 1350 units/hr. Hgb/Plt are stable, no bleeding per chart  Goal of Therapy:  Heparin level 0.3-0.7   Plan:  - Continue heparin 1350 units/hr  - Monitor s/s of bleeding and ongoing anticoagulation needs.   Bayard Hugger, PharmD, BCPS  Clinical Pharmacist  Pager: [phone]

## 2011-10-31 NOTE — Progress Notes (Signed)
    SUBJECTIVE: No chest pain or SOB. No events.   BP 112/71  Pulse 74  Temp(Src) 97.7 F (36.5 C) (Oral)  Resp 18  Ht 5\' 10"  (1.778 m)  Wt 236 lb 8.9 oz (107.3 kg)  BMI 33.94 kg/m2  SpO2 98%  Intake/Output Summary (Last 24 hours) at 10/31/11 1610 Last data filed at 10/31/11 0500  Gross per 24 hour  Intake 2034.87 ml  Output   2150 ml  Net -115.13 ml    PHYSICAL EXAM General: Well developed, well nourished, in no acute distress. Alert and oriented x 3.  Psych:  Good affect, responds appropriately Neck: No JVD. No masses noted.  Lungs: Clear bilaterally with no wheezes or rhonci noted.  Heart: RRR with no murmurs noted. Abdomen: Bowel sounds are present. Soft, non-tender.  Extremities: No lower extremity edema.   LABS: Basic Metabolic Panel:  Basename 10/30/11 0550  NA 135  K 4.2  CL 101  CO2 25  GLUCOSE 99  BUN 11  CREATININE 1.15  CALCIUM 9.4  MG --  PHOS --   CBC:  Basename 10/30/11 0550 10/29/11 0515  WBC 7.6 8.6  NEUTROABS -- --  HGB 14.1 14.1  HCT 43.1 40.9  MCV 90.5 88.5  PLT 254 242   Current Meds:    . aspirin  325 mg Oral Daily  . atorvastatin  80 mg Oral QHS  . metoprolol  50 mg Oral BID  . pantoprazole  40 mg Oral Q1200  . ramipril  2.5 mg Oral Daily     ASSESSMENT AND PLAN:   1. NSTEMI: Pt found by cath 10/28/11 to have triple vessel CAD with totally occluded RCA and LAD and high grade disease in ostium of OM1. He has been evaluated by Dr. Dorris Fetch with CT surgery and plans are being made for CABG tomorrow.  He had been loaded with Plavix so we are holding for washout. Will continue ASA, statin, beta blocker, Ace-inh, heparin drip. He has had no chest pain. Stable.    2. HTN: Well controlled.   3. Hyperlipidemia: on statin.   4. Dispo: Awaiting CABG tomorrow. He has had no recurrent chest pain.    Akili Cuda  4/25/20137:07 AM

## 2011-10-31 NOTE — Progress Notes (Signed)
UR Completed/ UPDATED Simmons, Lynk Marti F [phone]  

## 2011-10-31 NOTE — Progress Notes (Signed)
3 Days Post-Op Procedure(s) (LRB): LEFT HEART CATHETERIZATION WITH CORONARY ANGIOGRAM (N/A) PERCUTANEOUS CORONARY INTERVENTION-BALLOON ONLY () Subjective: No complaints  Objective: Vital signs in last 24 hours: Temp:  [97.7 F (36.5 C)-97.9 F (36.6 C)] 97.7 F (36.5 C) (04/25 0435) Pulse Rate:  [71-75] 74  (04/25 1008) Cardiac Rhythm:  [-] Normal sinus rhythm (04/25 0841) Resp:  [18] 18  (04/25 0435) BP: (96-112)/(61-71) 112/66 mmHg (04/25 1008) SpO2:  [95 %-98 %] 98 % (04/25 0435) Weight:  [233 lb 11 oz (106 kg)] 233 lb 11 oz (106 kg) (04/25 0700)  Hemodynamic parameters for last 24 hours:    Intake/Output from previous day: 04/24 0701 - 04/25 0700 In: 2034.9 [P.O.:1440; I.V.:594.9] Out: 2150 [Urine:2150] Intake/Output this shift:    General appearance: alert and no distress Heart: regular rate and rhythm Lungs: clear to auscultation bilaterally  Lab Results:  Basename 10/31/11 0610 10/30/11 0550  WBC 7.5 7.6  HGB 14.0 14.1  HCT 42.6 43.1  PLT 275 254   BMET:  Basename 10/30/11 0550  NA 135  K 4.2  CL 101  CO2 25  GLUCOSE 99  BUN 11  CREATININE 1.15  CALCIUM 9.4    PT/INR: No results found for this basename: LABPROT,INR in the last 72 hours ABG No results found for this basename: phart, pco2, po2, hco3, tco2, acidbasedef, o2sat   CBG (last 3)  No results found for this basename: GLUCAP:3 in the last 72 hours  Assessment/Plan: S/P Procedure(s) (LRB): LEFT HEART CATHETERIZATION WITH CORONARY ANGIOGRAM (N/A) PERCUTANEOUS CORONARY INTERVENTION-BALLOON ONLY () For CABG in AM Orders written All questions answered Carotids and PFT OK   LOS: 6 days    Clarice Zulauf C 10/31/2011

## 2011-11-01 ENCOUNTER — Encounter (HOSPITAL_COMMUNITY): Payer: Self-pay | Admitting: Anesthesiology

## 2011-11-01 ENCOUNTER — Inpatient Hospital Stay (HOSPITAL_COMMUNITY): Payer: Medicare Other

## 2011-11-01 ENCOUNTER — Encounter (HOSPITAL_COMMUNITY)
Admission: EM | Disposition: A | Discharge status: Home / Self Care | Payer: Self-pay | Source: Ambulatory Visit | Attending: Thoracic Surgery (Cardiothoracic Vascular Surgery)

## 2011-11-01 ENCOUNTER — Inpatient Hospital Stay (HOSPITAL_COMMUNITY): Payer: Medicare Other | Admitting: Anesthesiology

## 2011-11-01 DIAGNOSIS — I2510 Atherosclerotic heart disease of native coronary artery without angina pectoris: Secondary | ICD-10-CM

## 2011-11-01 HISTORY — PX: CORONARY ARTERY BYPASS GRAFT: SHX141

## 2011-11-01 LAB — POCT I-STAT 3, ART BLOOD GAS (G3+)
Acid-base deficit: 2 mmol/L (ref 0.0–2.0)
Acid-base deficit: 4 mmol/L — ABNORMAL HIGH (ref 0.0–2.0)
Acid-base deficit: 4 mmol/L — ABNORMAL HIGH (ref 0.0–2.0)
Bicarbonate: 22.8 mEq/L (ref 20.0–24.0)
Bicarbonate: 21.8 mEq/L (ref 20.0–24.0)
O2 Saturation: 99 %
O2 Saturation: 98 %
O2 Saturation: 97 %
Patient temperature: 36.3
Patient temperature: 37.3
TCO2: 28 mmol/L (ref 0–100)
TCO2: 24 mmol/L (ref 0–100)
TCO2: 23 mmol/L (ref 0–100)
pCO2 arterial: 43.2 mmHg (ref 35.0–45.0)
pH, Arterial: 7.39 (ref 7.350–7.450)
pH, Arterial: 7.32 — ABNORMAL LOW (ref 7.350–7.450)
pO2, Arterial: 114 mmHg — ABNORMAL HIGH (ref 80.0–100.0)

## 2011-11-01 LAB — BASIC METABOLIC PANEL
Calcium: 9.6 mg/dL (ref 8.4–10.5)
GFR calc non Af Amer: 65 mL/min — ABNORMAL LOW (ref >90)
Sodium: 136 mEq/L (ref 135–145)

## 2011-11-01 LAB — GLUCOSE, CAPILLARY
Glucose-Capillary: 84 mg/dL (ref 70–99)
Glucose-Capillary: 125 mg/dL — ABNORMAL HIGH (ref 70–99)

## 2011-11-01 LAB — PROTIME-INR: Prothrombin Time: 16.2 seconds — ABNORMAL HIGH (ref 11.6–15.2)

## 2011-11-01 LAB — POCT I-STAT 4, (NA,K, GLUC, HGB,HCT)
Glucose, Bld: 121 mg/dL — ABNORMAL HIGH (ref 70–99)
Glucose, Bld: 117 mg/dL — ABNORMAL HIGH (ref 70–99)
Glucose, Bld: 127 mg/dL — ABNORMAL HIGH (ref 70–99)
Glucose, Bld: 149 mg/dL — ABNORMAL HIGH (ref 70–99)
Glucose, Bld: 110 mg/dL — ABNORMAL HIGH (ref 70–99)
HCT: 36 % — ABNORMAL LOW (ref 39.0–52.0)
HCT: 29 % — ABNORMAL LOW (ref 39.0–52.0)
HCT: 29 % — ABNORMAL LOW (ref 39.0–52.0)
Hemoglobin: 12.2 g/dL — ABNORMAL LOW (ref 13.0–17.0)
Hemoglobin: 10.9 g/dL — ABNORMAL LOW (ref 13.0–17.0)
Potassium: 4.6 mEq/L (ref 3.5–5.1)
Potassium: 4.7 mEq/L (ref 3.5–5.1)
Potassium: 4.9 mEq/L (ref 3.5–5.1)
Potassium: 4 mEq/L (ref 3.5–5.1)
Sodium: 133 mEq/L — ABNORMAL LOW (ref 135–145)

## 2011-11-01 LAB — CBC
Hemoglobin: 11.4 g/dL — ABNORMAL LOW (ref 13.0–17.0)
MCH: 30.3 pg (ref 26.0–34.0)
MCHC: 33.2 g/dL (ref 30.0–36.0)
MCHC: 34.2 g/dL (ref 30.0–36.0)
MCV: 88.8 fL (ref 78.0–100.0)
Platelets: 159 10*3/uL (ref 150–400)
Platelets: 164 10*3/uL (ref 150–400)
RBC: 3.81 MIL/uL — ABNORMAL LOW (ref 4.22–5.81)
RDW: 13.6 % (ref 11.5–15.5)
RDW: 13.5 % (ref 11.5–15.5)
WBC: 8.3 10*3/uL (ref 4.0–10.5)

## 2011-11-01 LAB — PLATELET COUNT: Platelets: 177 10*3/uL (ref 150–400)

## 2011-11-01 LAB — CREATININE, SERUM
GFR calc Af Amer: >90 mL/min (ref >90)
GFR calc non Af Amer: 78 mL/min — ABNORMAL LOW (ref >90)

## 2011-11-01 LAB — HEPARIN LEVEL (UNFRACTIONATED): Heparin Unfractionated: 0.56 IU/mL (ref 0.30–0.70)

## 2011-11-01 LAB — POCT I-STAT, CHEM 8
BUN: 8 mg/dL (ref 6–23)
Chloride: 104 mEq/L (ref 96–112)
HCT: 34 % — ABNORMAL LOW (ref 39.0–52.0)
Sodium: 134 mEq/L — ABNORMAL LOW (ref 135–145)

## 2011-11-01 SURGERY — CORONARY ARTERY BYPASS GRAFTING (CABG)
Anesthesia: General | Site: Chest | Wound class: Clean

## 2011-11-01 MED ORDER — POTASSIUM CHLORIDE 10 MEQ/50ML IV SOLN: 10.0000 meq | INTRAVENOUS | Status: AC

## 2011-11-01 MED ORDER — BISACODYL 10 MG RE SUPP: 10.0000 mg | Freq: Every day | RECTAL | Status: DC

## 2011-11-01 MED ORDER — LACTATED RINGERS IV SOLN: 500.0000 mL | Freq: Once | INTRAVENOUS | Status: AC | PRN

## 2011-11-01 MED ORDER — HEPARIN SODIUM (PORCINE) 1000 UNIT/ML IJ SOLN
INTRAMUSCULAR | Status: DC | PRN
  Administered 2011-11-01: 2000 [IU] via INTRAVENOUS
  Administered 2011-11-01: 48000 [IU] via INTRAVENOUS

## 2011-11-01 MED ORDER — LIDOCAINE HCL (CARDIAC) 20 MG/ML IV SOLN
INTRAVENOUS | Status: DC | PRN
  Administered 2011-11-01: 50 mg via INTRAVENOUS

## 2011-11-01 MED ORDER — SODIUM CHLORIDE 0.9 % IJ SOLN
3.0000 mL | Freq: Two times a day (BID) | INTRAMUSCULAR | Status: DC
  Administered 2011-11-02 – 2011-11-03 (×3): 3 mL via INTRAVENOUS

## 2011-11-01 MED ORDER — NITROGLYCERIN IN D5W 200-5 MCG/ML-% IV SOLN
INTRAVENOUS | Status: DC | PRN
  Administered 2011-11-01: 5 ug/min via INTRAVENOUS

## 2011-11-01 MED ORDER — METOPROLOL TARTRATE 12.5 MG HALF TABLET
12.5000 mg | ORAL_TABLET | Freq: Two times a day (BID) | ORAL | Status: DC
  Filled 2011-11-01 (×3): qty 1

## 2011-11-01 MED ORDER — TRANEXAMIC ACID 100 MG/ML IV SOLN
2500.0000 mg | INTRAVENOUS | Status: DC | PRN
  Administered 2011-11-01: 1.5 mg/kg/h via INTRAVENOUS

## 2011-11-01 MED ORDER — ALBUMIN HUMAN 5 % IV SOLN
250.0000 mL | INTRAVENOUS | Status: AC | PRN
  Administered 2011-11-01 (×3): 250 mL via INTRAVENOUS
  Filled 2011-11-01: qty 500

## 2011-11-01 MED ORDER — LACTATED RINGERS IV SOLN
INTRAVENOUS | Status: DC | PRN
  Administered 2011-11-01: 07:00:00 via INTRAVENOUS

## 2011-11-01 MED ORDER — SODIUM CHLORIDE 0.45 % IV SOLN
INTRAVENOUS | Status: DC
  Administered 2011-11-01: 20 mL via INTRAVENOUS

## 2011-11-01 MED ORDER — SODIUM CHLORIDE 0.9 % IV SOLN: 250.0000 mL | INTRAVENOUS | Status: DC

## 2011-11-01 MED ORDER — ACETAMINOPHEN 160 MG/5ML PO SOLN
975.0000 mg | Freq: Four times a day (QID) | ORAL | Status: AC
  Filled 2011-11-01: qty 40.6

## 2011-11-01 MED ORDER — MORPHINE SULFATE 2 MG/ML IJ SOLN
1.0000 mg | INTRAMUSCULAR | Status: DC | PRN
  Administered 2011-11-01: 2 mg via INTRAVENOUS
  Filled 2011-11-01 (×3): qty 1

## 2011-11-01 MED ORDER — INSULIN ASPART 100 UNIT/ML ~~LOC~~ SOLN
0.0000 [IU] | SUBCUTANEOUS | Status: DC
  Administered 2011-11-01 – 2011-11-02 (×2): 2 [IU] via SUBCUTANEOUS

## 2011-11-01 MED ORDER — LACTATED RINGERS IV SOLN: INTRAVENOUS | Status: DC

## 2011-11-01 MED ORDER — CALCIUM CHLORIDE 10 % IV SOLN
1.0000 g | Freq: Once | INTRAVENOUS | Status: AC
  Administered 2011-11-01: 1 g via INTRAVENOUS

## 2011-11-01 MED ORDER — PROTAMINE SULFATE 10 MG/ML IV SOLN
INTRAVENOUS | Status: DC | PRN
  Administered 2011-11-01: 340 mg via INTRAVENOUS

## 2011-11-01 MED ORDER — SODIUM CHLORIDE 0.9 % IV SOLN
INTRAVENOUS | Status: DC
  Filled 2011-11-01: qty 1

## 2011-11-01 MED ORDER — SODIUM CHLORIDE 0.9 % IV SOLN: INTRAVENOUS | Status: DC

## 2011-11-01 MED ORDER — FENTANYL CITRATE 0.05 MG/ML IJ SOLN
INTRAMUSCULAR | Status: DC | PRN
  Administered 2011-11-01: 500 ug via INTRAVENOUS
  Administered 2011-11-01 (×3): 250 ug via INTRAVENOUS
  Administered 2011-11-01: 150 ug via INTRAVENOUS
  Administered 2011-11-01: 100 ug via INTRAVENOUS

## 2011-11-01 MED ORDER — INSULIN REGULAR BOLUS VIA INFUSION
0.0000 [IU] | Freq: Three times a day (TID) | INTRAVENOUS | Status: DC
  Filled 2011-11-01: qty 10

## 2011-11-01 MED ORDER — SODIUM CHLORIDE 0.9 % IV SOLN
100.0000 [IU] | INTRAVENOUS | Status: DC | PRN
  Administered 2011-11-01: 1.8 [IU]/h via INTRAVENOUS

## 2011-11-01 MED ORDER — MAGNESIUM SULFATE 40 MG/ML IJ SOLN
4.0000 g | Freq: Once | INTRAMUSCULAR | Status: AC
  Administered 2011-11-01: 4 g via INTRAVENOUS
  Filled 2011-11-01: qty 100

## 2011-11-01 MED ORDER — PHENYLEPHRINE HCL 10 MG/ML IJ SOLN
20.0000 mg | INTRAVENOUS | Status: DC | PRN
  Administered 2011-11-01: 10 ug/min via INTRAVENOUS

## 2011-11-01 MED ORDER — MIDAZOLAM HCL 5 MG/5ML IJ SOLN
INTRAMUSCULAR | Status: DC | PRN
  Administered 2011-11-01: 2 mg via INTRAVENOUS
  Administered 2011-11-01: 3 mg via INTRAVENOUS
  Administered 2011-11-01: 1 mg via INTRAVENOUS
  Administered 2011-11-01 (×3): 2 mg via INTRAVENOUS

## 2011-11-01 MED ORDER — ALBUMIN HUMAN 5 % IV SOLN
INTRAVENOUS | Status: DC | PRN
  Administered 2011-11-01: 12:00:00 via INTRAVENOUS

## 2011-11-01 MED ORDER — ROCURONIUM BROMIDE 100 MG/10ML IV SOLN
INTRAVENOUS | Status: DC | PRN
  Administered 2011-11-01: 50 mg via INTRAVENOUS

## 2011-11-01 MED ORDER — ACETAMINOPHEN 650 MG RE SUPP
650.0000 mg | RECTAL | Status: AC
  Administered 2011-11-01: 650 mg via RECTAL

## 2011-11-01 MED ORDER — SODIUM CHLORIDE 0.9 % IJ SOLN: 3.0000 mL | INTRAMUSCULAR | Status: DC | PRN

## 2011-11-01 MED ORDER — DOCUSATE SODIUM 100 MG PO CAPS
200.0000 mg | ORAL_CAPSULE | Freq: Every day | ORAL | Status: DC
  Administered 2011-11-02 – 2011-11-10 (×9): 200 mg via ORAL
  Filled 2011-11-01 (×8): qty 2

## 2011-11-01 MED ORDER — PHENYLEPHRINE HCL 10 MG/ML IJ SOLN
0.0000 ug/min | INTRAVENOUS | Status: DC
  Filled 2011-11-01: qty 2

## 2011-11-01 MED ORDER — PHENYLEPHRINE HCL 10 MG/ML IJ SOLN
30.0000 ug/min | INTRAVENOUS | Status: AC
  Filled 2011-11-01 (×2): qty 2

## 2011-11-01 MED ORDER — BISACODYL 5 MG PO TBEC
10.0000 mg | DELAYED_RELEASE_TABLET | Freq: Every day | ORAL | Status: DC
  Administered 2011-11-02 – 2011-11-10 (×8): 10 mg via ORAL
  Filled 2011-11-01 (×3): qty 2
  Filled 2011-11-01: qty 1
  Filled 2011-11-01 (×3): qty 2

## 2011-11-01 MED ORDER — PROPOFOL 10 MG/ML IV EMUL
INTRAVENOUS | Status: DC | PRN
  Administered 2011-11-01: 50 mg via INTRAVENOUS

## 2011-11-01 MED ORDER — MIDAZOLAM HCL 2 MG/2ML IJ SOLN: 2.0000 mg | INTRAMUSCULAR | Status: DC | PRN

## 2011-11-01 MED ORDER — 0.9 % SODIUM CHLORIDE (POUR BTL) OPTIME
TOPICAL | Status: DC | PRN
  Administered 2011-11-01: 6000 mL

## 2011-11-01 MED ORDER — HEMOSTATIC AGENTS (NO CHARGE) OPTIME
TOPICAL | Status: DC | PRN
  Administered 2011-11-01: 1 via TOPICAL

## 2011-11-01 MED ORDER — VANCOMYCIN HCL IN DEXTROSE 1-5 GM/200ML-% IV SOLN
1000.0000 mg | Freq: Once | INTRAVENOUS | Status: AC
  Administered 2011-11-01: 1000 mg via INTRAVENOUS
  Filled 2011-11-01 (×2): qty 200

## 2011-11-01 MED ORDER — VECURONIUM BROMIDE 10 MG IV SOLR
INTRAVENOUS | Status: DC | PRN
  Administered 2011-11-01: 5 mg via INTRAVENOUS
  Administered 2011-11-01: 3 mg via INTRAVENOUS
  Administered 2011-11-01: 4 mg via INTRAVENOUS
  Administered 2011-11-01: 2 mg via INTRAVENOUS

## 2011-11-01 MED ORDER — METOPROLOL TARTRATE 1 MG/ML IV SOLN: 2.5000 mg | INTRAVENOUS | Status: DC | PRN

## 2011-11-01 MED ORDER — OXYCODONE HCL 5 MG PO TABS
5.0000 mg | ORAL_TABLET | ORAL | Status: DC | PRN
  Administered 2011-11-02 – 2011-11-09 (×15): 10 mg via ORAL
  Filled 2011-11-01 (×15): qty 2

## 2011-11-01 MED ORDER — PANTOPRAZOLE SODIUM 40 MG PO TBEC
40.0000 mg | DELAYED_RELEASE_TABLET | Freq: Every day | ORAL | Status: DC
  Administered 2011-11-03 – 2011-11-09 (×7): 40 mg via ORAL
  Filled 2011-11-01 (×7): qty 1

## 2011-11-01 MED ORDER — ACETAMINOPHEN 160 MG/5ML PO SOLN: 650.0000 mg | ORAL | Status: AC

## 2011-11-01 MED ORDER — METOPROLOL TARTRATE 25 MG/10 ML ORAL SUSPENSION
12.5000 mg | Freq: Two times a day (BID) | ORAL | Status: DC
  Administered 2011-11-01: 12.5 mg
  Filled 2011-11-01 (×3): qty 5

## 2011-11-01 MED ORDER — MORPHINE SULFATE 4 MG/ML IJ SOLN: 2.0000 mg | INTRAMUSCULAR | Status: DC | PRN

## 2011-11-01 MED ORDER — DEXTROSE 5 % IV SOLN
1.5000 g | Freq: Two times a day (BID) | INTRAVENOUS | Status: DC
  Administered 2011-11-01 – 2011-11-03 (×4): 1.5 g via INTRAVENOUS
  Filled 2011-11-01 (×4): qty 1.5

## 2011-11-01 MED ORDER — ONDANSETRON HCL 4 MG/2ML IJ SOLN: 4.0000 mg | Freq: Four times a day (QID) | INTRAMUSCULAR | Status: DC | PRN

## 2011-11-01 MED ORDER — HEMOSTATIC AGENTS (NO CHARGE) OPTIME
TOPICAL | Status: DC | PRN
  Administered 2011-11-01: 3 via TOPICAL

## 2011-11-01 MED ORDER — FAMOTIDINE IN NACL 20-0.9 MG/50ML-% IV SOLN
20.0000 mg | Freq: Two times a day (BID) | INTRAVENOUS | Status: DC
  Administered 2011-11-01: 20 mg via INTRAVENOUS

## 2011-11-01 MED ORDER — SODIUM CHLORIDE 0.9 % IV SOLN
0.1000 ug/kg/h | INTRAVENOUS | Status: DC
  Filled 2011-11-01 (×2): qty 2

## 2011-11-01 MED ORDER — ACETAMINOPHEN 500 MG PO TABS
1000.0000 mg | ORAL_TABLET | Freq: Four times a day (QID) | ORAL | Status: AC
  Administered 2011-11-01 – 2011-11-05 (×14): 1000 mg via ORAL
  Filled 2011-11-01 (×21): qty 2

## 2011-11-01 MED ORDER — NITROGLYCERIN IN D5W 200-5 MCG/ML-% IV SOLN: 0.0000 ug/min | INTRAVENOUS | Status: DC

## 2011-11-01 MED ORDER — ASPIRIN EC 325 MG PO TBEC
325.0000 mg | DELAYED_RELEASE_TABLET | Freq: Every day | ORAL | Status: DC
  Administered 2011-11-02 – 2011-11-10 (×9): 325 mg via ORAL
  Filled 2011-11-01 (×9): qty 1

## 2011-11-01 MED ORDER — ASPIRIN 81 MG PO CHEW: 324.0000 mg | CHEWABLE_TABLET | Freq: Every day | ORAL | Status: DC

## 2011-11-01 SURGICAL SUPPLY — 99 items
ATTRACTOMAT 16X20 MAGNETIC DRP (DRAPES) ×2 IMPLANT
BAG DECANTER FOR FLEXI CONT (MISCELLANEOUS) ×2 IMPLANT
BANDAGE ELASTIC 4 VELCRO ST LF (GAUZE/BANDAGES/DRESSINGS) ×2 IMPLANT
BANDAGE ELASTIC 6 VELCRO ST LF (GAUZE/BANDAGES/DRESSINGS) ×2 IMPLANT
BANDAGE GAUZE ELAST BULKY 4 IN (GAUZE/BANDAGES/DRESSINGS) ×2 IMPLANT
BASKET HEART (ORDER IN 25'S) (MISCELLANEOUS) ×1
BASKET HEART (ORDER IN 25S) (MISCELLANEOUS) ×1 IMPLANT
BLADE STERNUM SYSTEM 6 (BLADE) ×2 IMPLANT
BLADE SURG 10 STRL SS (BLADE) ×2 IMPLANT
BLADE SURG ROTATE 9660 (MISCELLANEOUS) ×2 IMPLANT
CANISTER SUCTION 2500CC (MISCELLANEOUS) ×2 IMPLANT
CANN PRFSN .5XCNCT 15X34-48 (MISCELLANEOUS)
CANNULA PRFSN .5XCNCT 15X34-48 (MISCELLANEOUS) IMPLANT
CANNULA VEN 2 STAGE (MISCELLANEOUS)
CATH CPB KIT HENDRICKSON (MISCELLANEOUS) ×2 IMPLANT
CATH ROBINSON RED A/P 18FR (CATHETERS) ×2 IMPLANT
CATH THORACIC 36FR (CATHETERS) ×2 IMPLANT
CATH THORACIC 36FR RT ANG (CATHETERS) ×2 IMPLANT
CLIP FOGARTY SPRING 6M (CLIP) ×2 IMPLANT
CLIP TI MEDIUM 24 (CLIP) IMPLANT
CLIP TI WIDE RED SMALL 24 (CLIP) ×6 IMPLANT
CLOTH BEACON ORANGE TIMEOUT ST (SAFETY) ×2 IMPLANT
COVER SURGICAL LIGHT HANDLE (MISCELLANEOUS) ×4 IMPLANT
CRADLE DONUT ADULT HEAD (MISCELLANEOUS) ×2 IMPLANT
DRAPE CARDIOVASCULAR INCISE (DRAPES) ×1
DRAPE SLUSH MACHINE 52X66 (DRAPES) ×2 IMPLANT
DRAPE SLUSH/WARMER DISC (DRAPES) IMPLANT
DRAPE SRG 135X102X78XABS (DRAPES) ×1 IMPLANT
DRSG COVADERM 4X14 (GAUZE/BANDAGES/DRESSINGS) ×2 IMPLANT
ELECT REM PT RETURN 9FT ADLT (ELECTROSURGICAL) ×4
ELECTRODE REM PT RTRN 9FT ADLT (ELECTROSURGICAL) ×2 IMPLANT
GLOVE BIO SURGEON STRL SZ 6 (GLOVE) ×8 IMPLANT
GLOVE BIO SURGEON STRL SZ 6.5 (GLOVE) ×8 IMPLANT
GLOVE BIO SURGEON STRL SZ7 (GLOVE) IMPLANT
GLOVE BIO SURGEON STRL SZ7.5 (GLOVE) IMPLANT
GLOVE BIO SURGEON STRL SZ8 (GLOVE) ×2 IMPLANT
GLOVE BIOGEL PI IND STRL 6 (GLOVE) ×5 IMPLANT
GLOVE BIOGEL PI IND STRL 6.5 (GLOVE) IMPLANT
GLOVE BIOGEL PI IND STRL 7.0 (GLOVE) ×4 IMPLANT
GLOVE BIOGEL PI IND STRL 7.5 (GLOVE) IMPLANT
GLOVE BIOGEL PI INDICATOR 6 (GLOVE) ×5
GLOVE BIOGEL PI INDICATOR 6.5 (GLOVE)
GLOVE BIOGEL PI INDICATOR 7.0 (GLOVE) ×4
GLOVE BIOGEL PI INDICATOR 7.5 (GLOVE)
GLOVE EUDERMIC 7 POWDERFREE (GLOVE) ×6 IMPLANT
GOWN PREVENTION PLUS XLARGE (GOWN DISPOSABLE) ×16 IMPLANT
GOWN STRL NON-REIN LRG LVL3 (GOWN DISPOSABLE) ×12 IMPLANT
HEMOSTAT POWDER SURGIFOAM 1G (HEMOSTASIS) ×6 IMPLANT
HEMOSTAT SURGICEL 2X14 (HEMOSTASIS) ×2 IMPLANT
INSERT FOGARTY XLG (MISCELLANEOUS) IMPLANT
KIT BASIN OR (CUSTOM PROCEDURE TRAY) ×2 IMPLANT
KIT ROOM TURNOVER OR (KITS) ×2 IMPLANT
KIT SUCTION CATH 14FR (SUCTIONS) ×4 IMPLANT
KIT VASOVIEW W/TROCAR VH 2000 (KITS) ×2 IMPLANT
MARKER GRAFT CORONARY BYPASS (MISCELLANEOUS) ×6 IMPLANT
NS IRRIG 1000ML POUR BTL (IV SOLUTION) ×10 IMPLANT
PACK OPEN HEART (CUSTOM PROCEDURE TRAY) ×2 IMPLANT
PAD ARMBOARD 7.5X6 YLW CONV (MISCELLANEOUS) ×4 IMPLANT
PEDIATRIC SUCKERS (MISCELLANEOUS) ×2 IMPLANT
PENCIL BUTTON HOLSTER BLD 10FT (ELECTRODE) ×2 IMPLANT
PUNCH AORTIC ROTATE 4.0MM (MISCELLANEOUS) IMPLANT
PUNCH AORTIC ROTATE 4.5MM 8IN (MISCELLANEOUS) ×2 IMPLANT
PUNCH AORTIC ROTATE 5MM 8IN (MISCELLANEOUS) IMPLANT
SET CARDIOPLEGIA MPS 5001102 (MISCELLANEOUS) ×2 IMPLANT
SPONGE GAUZE 4X4 12PLY (GAUZE/BANDAGES/DRESSINGS) ×6 IMPLANT
SPONGE LAP 18X18 X RAY DECT (DISPOSABLE) ×4 IMPLANT
SUT BONE WAX W31G (SUTURE) ×2 IMPLANT
SUT MNCRL AB 4-0 PS2 18 (SUTURE) IMPLANT
SUT PROLENE 3 0 SH DA (SUTURE) ×2 IMPLANT
SUT PROLENE 4 0 RB 1 (SUTURE) ×2
SUT PROLENE 4 0 SH DA (SUTURE) IMPLANT
SUT PROLENE 4-0 RB1 .5 CRCL 36 (SUTURE) ×2 IMPLANT
SUT PROLENE 6 0 C 1 30 (SUTURE) ×10 IMPLANT
SUT PROLENE 7 0 BV1 MDA (SUTURE) ×4 IMPLANT
SUT PROLENE 8 0 BV175 6 (SUTURE) IMPLANT
SUT SILK  1 MH (SUTURE) ×1
SUT SILK 1 MH (SUTURE) ×1 IMPLANT
SUT STEEL 6MS V (SUTURE) ×2 IMPLANT
SUT STEEL STERNAL CCS#1 18IN (SUTURE) IMPLANT
SUT STEEL SZ 6 DBL 3X14 BALL (SUTURE) ×2 IMPLANT
SUT VIC AB 1 CTX 36 (SUTURE) ×2
SUT VIC AB 1 CTX36XBRD ANBCTR (SUTURE) ×2 IMPLANT
SUT VIC AB 2-0 CT1 27 (SUTURE) ×1
SUT VIC AB 2-0 CT1 TAPERPNT 27 (SUTURE) ×1 IMPLANT
SUT VIC AB 2-0 CTX 27 (SUTURE) IMPLANT
SUT VIC AB 3-0 SH 27 (SUTURE)
SUT VIC AB 3-0 SH 27X BRD (SUTURE) IMPLANT
SUT VIC AB 3-0 X1 27 (SUTURE) ×2 IMPLANT
SUT VICRYL 4-0 PS2 18IN ABS (SUTURE) IMPLANT
SUTURE E-PAK OPEN HEART (SUTURE) ×2 IMPLANT
SYSTEM SAHARA CHEST DRAIN ATS (WOUND CARE) ×2 IMPLANT
TAPE CLOTH SURG 4X10 WHT LF (GAUZE/BANDAGES/DRESSINGS) ×4 IMPLANT
TOWEL OR 17X24 6PK STRL BLUE (TOWEL DISPOSABLE) ×4 IMPLANT
TOWEL OR 17X26 10 PK STRL BLUE (TOWEL DISPOSABLE) ×4 IMPLANT
TRAY FOLEY IC TEMP SENS 14FR (CATHETERS) ×2 IMPLANT
TUBE FEEDING 8FR 16IN STR KANG (MISCELLANEOUS) ×2 IMPLANT
TUBING INSUFFLATION 10FT LAP (TUBING) ×2 IMPLANT
UNDERPAD 30X30 INCONTINENT (UNDERPADS AND DIAPERS) ×2 IMPLANT
WATER STERILE IRR 1000ML POUR (IV SOLUTION) ×4 IMPLANT

## 2011-11-01 NOTE — OR Nursing (Signed)
Second time out with Dr Dorris Fetch and chest incision @ 08:45.

## 2011-11-01 NOTE — H&P (Signed)
Reason for Consult:3 Vessel CAD, s/p NQWMI Referring Physician: Dr. McAlhany  Jesus Maldonado is an 78 y.o. male.  HPI: [name] with multiple CRF and a history of CAD treated with PTCA/ stenting presents with cc/o CP.  History of CAD. He had 2 stents placed about 10 yrs ago in [place], [place]. He had done well from a cardica standpoint since that time. Moved to [place] about 6 months ago to be close to family. He first noted CP 2 weeks ago. Came on at rest and resolved spontaneously. He was awakened from sleep at 3 AM on 4/19 with severe pain originating on his left chest and radiating around the chest as well as down the arm. He experienced diaphoresis and shortness of breath. He took some old SL NTG without relief and then developed nausea and vomiting. His daughter spoke with him the next morning and knew he was not feeling well. She took him to . He had a positive troponin. He was loded with Plavix and treated with aspirin, heparin and nitroglycerin.  Past Medical History  Diagnosis Date  . Myocardial infarct   . Hypertension   . Hypercholesteremia   . Arthritis   . Coronary artery disease     s/p PCI 10 yrs ago.    Past Surgical History  Procedure Date  . Cardiac stents   . Hemorroidectomy     History reviewed. No pertinent family history.  Social History:  reports that he has quit smoking. He does not have any smokeless tobacco history on file. He reports that he does not drink alcohol or use illicit drugs.  Allergies: No Known Allergies  Medications:  I have reviewed the patient's current medications. Prior to Admission:  Prescriptions prior to admission  Medication Sig Dispense Refill  . aspirin 325 MG tablet Take 325 mg by mouth daily.      . atorvastatin (LIPITOR) 40 MG tablet Take 40 mg by mouth at bedtime.      . cimetidine (TAGAMET) 800 MG tablet Take 800 mg by mouth 2 (two) times daily.      . metoprolol (LOPRESSOR) 50 MG tablet Take 50 mg by mouth 2 (two) times  daily.      . nitroGLYCERIN (NITROSTAT) 0.4 MG SL tablet Place 0.4 mg under the tongue every 5 (five) minutes as needed.      . ramipril (ALTACE) 2.5 MG capsule Take 2.5 mg by mouth daily.        Results for orders placed during the hospital encounter of 10/25/11 (from the past 48 hour(s))  HEPARIN LEVEL (UNFRACTIONATED)     Status: Normal   Collection Time   10/26/11  8:14 PM      Component Value Range Comment   Heparin Unfractionated 0.45  0.30 - 0.70 (IU/mL)   CBC     Status: Abnormal   Collection Time   10/27/11  6:25 AM      Component Value Range Comment   WBC 10.6 (*) 4.0 - 10.5 (K/uL)    RBC 4.94  4.22 - 5.81 (MIL/uL)    Hemoglobin 14.9  13.0 - 17.0 (g/dL)    HCT 43.9  39.0 - 52.0 (%)    MCV 88.9  78.0 - 100.0 (fL)    MCH 30.2  26.0 - 34.0 (pg)    MCHC 33.9  30.0 - 36.0 (g/dL)    RDW 13.7  11.5 - 15.5 (%)    Platelets 238  150 - 400 (K/uL)   HEPARIN   LEVEL (UNFRACTIONATED)     Status: Normal   Collection Time   10/27/11  6:25 AM      Component Value Range Comment   Heparin Unfractionated 0.44  0.30 - 0.70 (IU/mL)   HEMOGLOBIN A1C     Status: Abnormal   Collection Time   10/27/11  6:25 AM      Component Value Range Comment   Hemoglobin A1C 5.8 (*) <5.7 (%)    Mean Plasma Glucose 120 (*) <117 (mg/dL)   PLATELET INHIBITION P2Y12     Status: Normal   Collection Time   10/27/11 10:54 AM      Component Value Range Comment   Platelet Function  P2Y12 195  194 - 418 (PRU)   CBC     Status: Normal   Collection Time   10/28/11  5:25 AM      Component Value Range Comment   WBC 8.6  4.0 - 10.5 (K/uL)    RBC 4.71  4.22 - 5.81 (MIL/uL)    Hemoglobin 14.2  13.0 - 17.0 (g/dL)    HCT 42.0  39.0 - 52.0 (%)    MCV 89.2  78.0 - 100.0 (fL)    MCH 30.1  26.0 - 34.0 (pg)    MCHC 33.8  30.0 - 36.0 (g/dL)    RDW 13.7  11.5 - 15.5 (%)    Platelets 234  150 - 400 (K/uL)   HEPARIN LEVEL (UNFRACTIONATED)     Status: Normal   Collection Time   10/28/11  5:25 AM      Component Value Range  Comment   Heparin Unfractionated 0.43  0.30 - 0.70 (IU/mL)   POCT ACTIVATED CLOTTING TIME     Status: Normal   Collection Time   10/28/11  8:12 AM      Component Value Range Comment   Activated Clotting Time 490       No results found.  Review of Systems  Constitutional: Positive for malaise/fatigue and diaphoresis. Negative for fever, chills and weight loss.  Respiratory: Negative for cough.   Cardiovascular: Positive for chest pain.  Gastrointestinal: Positive for heartburn, nausea and vomiting.  Musculoskeletal: Positive for joint pain.  Neurological: Positive for dizziness. Negative for weakness.  All other systems reviewed and are negative.   Blood pressure 96/52, pulse 75, temperature 98 F (36.7 C), temperature source Oral, resp. rate 16, height 5' 10" (1.778 m), weight 236 lb 8.9 oz (107.3 kg), SpO2 96.00%. Physical Exam  Vitals reviewed. Constitutional: He is oriented to person, place, and time. He appears well-developed and well-nourished. No distress.  HENT:  Head: Normocephalic and atraumatic.  Eyes: EOM are normal. Pupils are equal, round, and reactive to light.  Neck: Neck supple. No JVD present. No thyromegaly present.  Cardiovascular: Normal rate, regular rhythm and normal heart sounds.  Exam reveals no gallop and no friction rub.   No murmur heard. Respiratory: Effort normal and breath sounds normal.  GI: Soft. There is no tenderness.  Musculoskeletal: He exhibits no edema.  Lymphadenopathy:    He has no cervical adenopathy.  Neurological: He is alert and oriented to person, place, and time. No cranial nerve deficit.  Skin: Skin is warm and dry.   Cardiac cath 10/28/11 Angiographic Findings:  Left main: Diffuse calcification with mild diffuse plaque.  Left Anterior Descending Artery: Large caliber vessel that courses to the apex. There is a large caliber septal perforating branch. The proximal vessel has diffuse 40% stenosis. The mid vessel has an aneurysmal    segment followed by a total occlusion of the mid LAD. The moderate sized diagonal branch arises just at the occlusion of the mid LAD. The diagonal branch has an ostial 40% stenosis and mild plaque in mid vessel. The mid and distal LAD fills slowly from left to left collaterals.  Circumflex Artery: Large caliber vessel. The first OM branch is moderate sized and has an ostial 90% stenosis. The second OM is moderate sized and has 40% proximal stenosis. The AV groove Circumflex has mild plaque disease but no flow limiting lesions.  Right Coronary Artery: Large dominant vessel with 60% serial lesions in the proximal vessel. The mid vessel has a stented segment with 100% occlusion within the stented segment. The mid and distal vessel fills from left to right collaterals.  Left Ventricular Angiogram: No LV gram. (LVEF preserved by Echo)  Impression:  1. Severe triple vessel CAD  2. NSTEMI  3. Preserved LV systolic function  4. Unsuccessful attempt at angioplasty of the occluded mid LAD   Assessment/Plan: [name] with a NQWMI is found to have severe 3 vessel CAD. CABG indicated for survival benefit and relief of symptoms.  I have discussed with the patient and his daughter the general nature of the procedure, need for general anesthesia, and incisions to be used. I have discussed the expected hospital stay, overall recovery and short and long term outcomes. They understand the risks include but are not limited to death, stroke, MI, DVT/PE, bleeding, possible need for transfusion, infections, and other organ system dysfunction including respiratory, renal, or GI complications. He understands and accepts these risks and agrees to proceed.  He was loaded with plavix. We need to allow for plavix washout prior to surgery to lessen his risk for associated bleeding complications. I think we should be safe to proceed by Friday 11/01/11.  Folashade Gamboa C 10/28/2011, 6:50 PM        by Friday 11/01/11.   Majel Giel C 10/28/2011, 6:50 PM

## 2011-11-01 NOTE — Progress Notes (Signed)
   CARE MANAGEMENT NOTE 11/01/2011  Patient:  HUIE, GHUMAN   Account Number:  [id number]  Date Initiated:  11/01/2011  Documentation initiated by:  GRAVES-BIGELOW,Lindberg Zenon  Subjective/Objective Assessment:   Pt admitted with cp. Has family support. S/p LHC-10/28/11 showed triple vessel CAD with totally occluded RCA and LAD and high grade disease in ostium of OM1. Plan CABG 11-01-11.     Action/Plan:   Anticipated DC Date:  11/07/2011   Anticipated DC Plan:  HOME W HOME HEALTH SERVICES      DC Planning Services  CM consult      Choice offered to / List presented to:             Status of service:  In process, will continue to follow Medicare Important Message given?   (If response is "NO", the following Medicare IM given date fields will be blank) Date Medicare IM given:   Date Additional Medicare IM given:    Discharge Disposition:    Per UR Regulation:    If discussed at Long Length of Stay Meetings, dates discussed:    Comments:  11-01-11 0943 Tomi Bamberger, RN,BSN [phone] CM will continue to monitor for d/c disposition.

## 2011-11-01 NOTE — Brief Op Note (Addendum)
                   [address]            [name] [address]          [phone]    10/25/2011 - 11/01/2011  11:37 AM  PATIENT:  Jesus Maldonado  78 y.o. male  PRE-OPERATIVE DIAGNOSIS:  CAD  POST-OPERATIVE DIAGNOSIS:  coronary Artery Disease  PROCEDURE:  Procedure(s): CORONARY ARTERY BYPASS GRAFTING (CABG)X4 LIMA-LAD; SVG-OM; SVG-PD; SVG-DIAG EVH RIGHT LEG  SURGEON:  Surgeon(s): Loreli Slot, MD  PHYSICIAN ASSISTANT: WAYNE GOLD PA-C  ANESTHESIA:   general  PATIENT CONDITION:  ICU - intubated and hemodynamically stable.  PRE-OPERATIVE WEIGHT: 104.9kg  COMPLICATIONS: NO KNOWN  XC 74 min CPB 108 min  LIMA good quality, vein large caliber Good targets

## 2011-11-01 NOTE — Procedures (Signed)
Extubation Procedure Note  Patient Details:   Name: Jesus Maldonado DOB: Nov 08, 1930 MRN: [id number]       Evaluation  O2 sats: stable throughout Complications: No apparent complications Patient did tolerate procedure well. Bilateral Breath Sounds: Clear   Yes  Pt extubated at this time per SICU protocol and tolerated well and placed on 3L [place]. VC 0.8 L/min, NIF -28 cm H2O, VS stable: HR 92, SPo2 99%, RR 18. Pt able to breathe around deflated cuff, pt has strong adequate cough. NO stridor noted. RT will continue to monitor.    Loyal Jacobson [facility] 11/01/2011, 6:26 PM

## 2011-11-01 NOTE — Progress Notes (Signed)
Patient ID: Jesus Maldonado, male   DOB: 09-14-1930, 78 y.o.   MRN: [id number] Stable early postop Extubated BP 148/71  Pulse 90  Temp(Src) 99.1 F (37.3 C) (Oral)  Resp 20  Ht 5\' 10"  (1.778 m)  Wt 231 lb 4.2 oz (104.9 kg)  BMI 33.18 kg/m2  SpO2 99% CI > 2.0 Good Uo  Intake/Output Summary (Last 24 hours) at 11/01/11 1829 Last data filed at 11/01/11 1700  Gross per 24 hour  Intake   3835 ml  Output   3480 ml  Net    355 ml   Doing well

## 2011-11-01 NOTE — Preoperative (Addendum)
Beta Blockers   Reason not to administer Beta Blockers:Not Applicable 

## 2011-11-01 NOTE — OR Nursing (Signed)
First call to SICU at1225 - 45 min.

## 2011-11-01 NOTE — OR Nursing (Signed)
Second call to SICU at1253 - 20 min.

## 2011-11-01 NOTE — Op Note (Signed)
NAMEGENEVA, PALLAS NO.:  [id number]  MEDICAL RECORD NO.:  [id number]  LOCATION:  2306                         FACILITY:  [facility]  PHYSICIAN:  Salvatore Decent. Dorris Fetch, M.D.DATE OF BIRTH:  [birth year]  DATE OF PROCEDURE:  11/01/2011 DATE OF DISCHARGE:                              OPERATIVE REPORT   PREOPERATIVE DIAGNOSIS:  Severe three-vessel coronary disease status post non-Q-wave myocardial infarction.  POSTOPERATIVE DIAGNOSIS:  Severe three-vessel coronary disease status post non-Q-wave myocardial infarction.  PROCEDURE:  Median sternotomy, extracorporeal circulation, coronary artery bypass grafting x4 (left internal mammary artery to left anterior descending, saphenous vein graft to posterior descending, saphenous vein graft to first diagonal, saphenous vein graft to obtuse marginal 1), endoscopic vein harvest - right leg.  SURGEON:  Salvatore Decent. Dorris Fetch, MD  ASSISTANT:  Rowe Clack, PA-C  ANESTHESIA:  General.  FINDINGS:  Vein large calibered but otherwise of good quality, mammary good quality, good quality targets.  CLINICAL NOTE:  Mr. Jesus Maldonado is a 78 year old gentleman with a known history of coronary disease who presented with a non-Q-wave MI.  At cardiac catheterization, he was found to have severe three-vessel coronary disease and was referred for coronary artery bypass grafting.  The patient understood and accepted the indications, risks, benefits, and alternatives and agreed to proceed.  We did need to delay surgery to allow for Plavix washout prior to undergoing the procedure.  The patient was felt to be ready for surgery on Friday, November 01, 2011.  OPERATIVE NOTE:  Mr. Alkhatib was brought to the preop holding area on November 01, 2011.  There, the Anesthesia Service placed a Swan-Ganz catheter and an arterial blood pressure monitoring line.  He was taken to the operating room, anesthetized, and intubated.  Intravenous antibiotics were  administered.  A Foley catheter was placed.  The chest, abdomen, and legs were prepped and draped in the usual sterile fashion.  An incision was made in the medial aspect of the right leg just below the knee.  The greater saphenous vein was identified and was harvested endoscopically from groin to upper calf.  The vein was relatively large in caliber but was otherwise of good quality.  There were no discrete varicosities or areas of sclerosis.  Simultaneously, a median sternotomy was performed and the left internal mammary artery was harvested using standard technique.  It was a good quality vessel.  A 2000 units of heparin was administered during the vessel harvest, the remainder of full heparin dose given prior to opening the pericardium.  After harvesting the conduit, the pericardium was opened.  Remainder of the full heparin dose was given.  After confirming adequate anticoagulation with ACT measurement, the aorta was cannulated via concentric 2-0 Ethibond pledgeted pursestring sutures.  A dual-stage venous cannula was placed via pursestring suture in the right atrial appendage.  Cardiopulmonary bypass was instituted, and the patient was cooled to 32 degrees Celsius.  Flows were maintained per protocol.  The coronary arteries were inspected and anastomotic sites were chosen.  The conduits were inspected and cut to length.  A foam pad was placed in the pericardium to insulate the heart and protect the left phrenic nerve.  A temperature probe was placed in myocardial septum and a cardioplegic cannula was placed in the ascending aorta.  The aorta was crossclamped.  The left ventricle was emptied via the aortic root vent.  Cardiac arrest then was achieved with combination of cold antegrade blood cardioplegia and topical iced saline, 1.5 L of cardioplegia was administered.  There was a rapid diastolic arrest. Myocardial septal cooling was more delayed, but there was cooling to  10 degrees Celsius.  The following distal anastomoses were performed.  First, a reverse saphenous vein graft was placed end-to-side to the posterior descending branch of the right coronary.  This was a 1.5-mm good quality target.  The vein was anastomosed end-to-side with a running 7-0 Prolene suture.  At the completion of each anastomosis, a probe was passed proximally and distally to ensure patency before tying the suture.  Cardioplegia was then administered through each vein graft to assess flow and hemostasis.  Next, a reverse saphenous vein graft was placed end-to-side to the 1st diagonal branch to the LAD.  This was a 1.5-mm good quality target at the site of the anastomosis.  The vein was anastomosed end-to-side with a running 7 Prolene suture.  Again, there was good flow and good hemostasis.  Next, a reverse saphenous vein graft was placed end-to-side to the 1st obtuse marginal branch of the left circumflex.  This vessel had a 90% ostial stenosis.  It was 1.5 mm and of good quality at the site of the anastomosis.  The vein was anastomosed end-to-side with a running 7-0 Prolene suture and again, there was good flow and good hemostasis.  Next, the left internal mammary artery was brought through a window in the pericardium.  The distal end was beveled.  It was then anastomosed end-to-side to the distal LAD.  The LAD was a 1.5-mm, good quality target at the site of anastomosis.  The mammary was a 2-mm good quality conduit.  The anastomosis was performed end-to-side with a running 8-0 Prolene suture.  At the completion of the mammary to LAD anastomosis, the bulldog clamp was briefly removed to inspect for hemostasis. Immediate and rapid septal rewarming was noted.  The bulldog clamp was replaced, and the mammary pedicle was tacked to the epicardial surface of the heart with 6-0 Prolene sutures.  Additional cardioplegia was administered.  The vein grafts were cut to length.  The  cardioplegic cannula was removed from the ascending aorta and the proximal vein graft anastomoses were performed to 4.5 mm punch aortotomies with running 6-0 Prolene sutures.  At the completion of the final proximal anastomosis, the patient was placed in Trendelenburg position.  Lidocaine was administered.  The aortic root was de-aired and the bulldog clamp was again removed from the left mammary artery and the aortic crossclamp was removed.  The total crossclamp time was 74 minutes.  While rewarming was completed, all proximal and distal anastomoses were inspected for hemostasis.  Epicardial pacing wires were placed on the right ventricle and right atrium.  The patient did require a single defibrillation with 20 joules but then was in sinus rhythm thereafter. When the patient had warmed to a core temperature of 37 degrees Celsius, he was weaned from cardiopulmonary bypass on the 1st attempt without difficulty.  Total bypass time was 108 minutes.  The initial cardiac index was greater than 2 L per minute per meter squared.  The patient remained hemodynamically stable throughout the post bypass period.  A test dose of protamine was administered and was  well tolerated.  The atrial and aortic cannulae were removed.  The remainder of the protamine was administered without incident.  The chest was irrigated with warm saline.  Hemostasis was achieved.  A left pleural and single mediastinal chest tubes were placed through separate subcostal incisions.  The pericardium was reapproximated with interrupted 3-0 silk sutures, it came together easily without tension.  The sternum was closed with a combination of single and double heavy gauge stainless steel wires.  The pectoralis fascia, subcutaneous tissue, and skin were closed in standard fashion.  All sponge, needle, and instrument counts were correct at the end of the procedure.  The patient was taken from the operating room to the surgical  intensive care unit in good condition.     Salvatore Decent Dorris Fetch, M.D.     SCH/MEDQ  D:  11/01/2011  T:  11/01/2011  Job:  [id number]

## 2011-11-01 NOTE — Interval H&P Note (Signed)
History and Physical Interval Note:  11/01/2011 7:39 AM  Jesus Maldonado  has presented today for surgery, with the diagnosis of CAD  The various methods of treatment have been discussed with the patient and family. After consideration of risks, benefits and other options for treatment, the patient has consented to  Procedure(s) (LRB): CORONARY ARTERY BYPASS GRAFTING (CABG) (N/A) as a surgical intervention .  The patients' history has been reviewed, patient examined, no change in status, stable for surgery.  I have reviewed the patients' chart and labs.  Questions were answered to the patient's satisfaction.     Shams Fill C

## 2011-11-01 NOTE — Anesthesia Preprocedure Evaluation (Signed)
Anesthesia Evaluation Anesthesia Physical Anesthesia Plan  ASA: III  Anesthesia Plan: General   Post-op Pain Management:    Induction: Intravenous  Airway Management Planned: Oral ETT  Additional Equipment: Arterial line, CVP and PA Cath  Intra-op Plan:   Post-operative Plan: Post-operative intubation/ventilation  Informed Consent: I have reviewed the patients History and Physical, chart, labs and discussed the procedure including the risks, benefits and alternatives for the proposed anesthesia with the patient or authorized representative who has indicated his/her understanding and acceptance.     Plan Discussed with: CRNA, Anesthesiologist and Surgeon  Anesthesia Plan Comments:         Anesthesia Quick Evaluation

## 2011-11-01 NOTE — Anesthesia Postprocedure Evaluation (Signed)
  Anesthesia Post-op Note  Patient: Jesus Maldonado  Procedure(s) Performed: Procedure(s) (LRB): CORONARY ARTERY BYPASS GRAFTING (CABG) (N/A)  Patient Location: SICU  Anesthesia Type: General  Level of Consciousness: sedated and unresponsive  Airway and Oxygen Therapy: Patient remains intubated per anesthesia plan and Patient placed on Ventilator (see vital sign flow sheet for setting)  Post-op Pain: none  Post-op Assessment: Post-op Vital signs reviewed, Patient's Cardiovascular Status Stable, Respiratory Function Stable, Patent Airway, No signs of Nausea or vomiting and Pain level controlled  Post-op Vital Signs: stable  Complications: No apparent anesthesia complications

## 2011-11-01 NOTE — Transfer of Care (Signed)
Immediate Anesthesia Transfer of Care Note  Patient: Jesus Maldonado  Procedure(s) Performed: Procedure(s) (LRB): CORONARY ARTERY BYPASS GRAFTING (CABG) (N/A)  Patient Location: PACU and SICU  Anesthesia Type: General  Level of Consciousness: unresponsive  Airway & Oxygen Therapy: Patient remains intubated per anesthesia plan and Patient placed on Ventilator (see vital sign flow sheet for setting)  Post-op Assessment: Report given to PACU RN and Post -op Vital signs reviewed and stable  Post vital signs: Reviewed and stable  Complications: No apparent anesthesia complications

## 2011-11-02 ENCOUNTER — Inpatient Hospital Stay (HOSPITAL_COMMUNITY): Payer: Medicare Other

## 2011-11-02 LAB — CBC
MCV: 89 fL (ref 78.0–100.0)
Platelets: 173 10*3/uL (ref 150–400)
Platelets: 183 10*3/uL (ref 150–400)
RDW: 13.7 % (ref 11.5–15.5)
RDW: 13.5 % (ref 11.5–15.5)
WBC: 9.2 10*3/uL (ref 4.0–10.5)
WBC: 11.3 10*3/uL — ABNORMAL HIGH (ref 4.0–10.5)

## 2011-11-02 LAB — BASIC METABOLIC PANEL
Calcium: 8.9 mg/dL (ref 8.4–10.5)
Chloride: 101 mEq/L (ref 96–112)
Creatinine, Ser: 0.99 mg/dL (ref 0.50–1.35)
GFR calc Af Amer: 89 mL/min — ABNORMAL LOW (ref >90)
GFR calc non Af Amer: 76 mL/min — ABNORMAL LOW (ref >90)

## 2011-11-02 LAB — POCT I-STAT, CHEM 8
Calcium, Ion: 1.23 mmol/L (ref 1.12–1.32)
Chloride: 97 mEq/L (ref 96–112)
Glucose, Bld: 124 mg/dL — ABNORMAL HIGH (ref 70–99)
HCT: 36 % — ABNORMAL LOW (ref 39.0–52.0)
Hemoglobin: 12.2 g/dL — ABNORMAL LOW (ref 13.0–17.0)

## 2011-11-02 LAB — GLUCOSE, CAPILLARY
Glucose-Capillary: 112 mg/dL — ABNORMAL HIGH (ref 70–99)
Glucose-Capillary: 107 mg/dL — ABNORMAL HIGH (ref 70–99)

## 2011-11-02 LAB — MAGNESIUM
Magnesium: 2.4 mg/dL (ref 1.5–2.5)
Magnesium: 2 mg/dL (ref 1.5–2.5)

## 2011-11-02 LAB — CREATININE, SERUM
GFR calc Af Amer: 76 mL/min — ABNORMAL LOW (ref >90)
GFR calc non Af Amer: 65 mL/min — ABNORMAL LOW (ref >90)

## 2011-11-02 MED ORDER — METOPROLOL TARTRATE 25 MG/10 ML ORAL SUSPENSION
25.0000 mg | Freq: Two times a day (BID) | ORAL | Status: DC
  Filled 2011-11-02 (×8): qty 10

## 2011-11-02 MED ORDER — ATORVASTATIN CALCIUM 40 MG PO TABS
40.0000 mg | ORAL_TABLET | Freq: Every day | ORAL | Status: DC
  Administered 2011-11-02 – 2011-11-09 (×8): 40 mg via ORAL
  Filled 2011-11-02 (×9): qty 1

## 2011-11-02 MED ORDER — ENOXAPARIN SODIUM 40 MG/0.4ML ~~LOC~~ SOLN
40.0000 mg | SUBCUTANEOUS | Status: DC
  Administered 2011-11-02 – 2011-11-10 (×9): 40 mg via SUBCUTANEOUS
  Filled 2011-11-02 (×9): qty 0.4

## 2011-11-02 MED ORDER — FUROSEMIDE 10 MG/ML IJ SOLN
40.0000 mg | Freq: Once | INTRAMUSCULAR | Status: AC
  Administered 2011-11-02: 40 mg via INTRAVENOUS
  Filled 2011-11-02: qty 4

## 2011-11-02 MED ORDER — INSULIN ASPART 100 UNIT/ML ~~LOC~~ SOLN
0.0000 [IU] | SUBCUTANEOUS | Status: DC
  Administered 2011-11-02: 4 [IU] via SUBCUTANEOUS
  Administered 2011-11-02: 2 [IU] via SUBCUTANEOUS

## 2011-11-02 MED ORDER — ONDANSETRON HCL 4 MG/2ML IJ SOLN: 4.0000 mg | Freq: Once | INTRAMUSCULAR | Status: DC | PRN

## 2011-11-02 MED ORDER — METOPROLOL TARTRATE 25 MG PO TABS
25.0000 mg | ORAL_TABLET | Freq: Two times a day (BID) | ORAL | Status: DC
  Administered 2011-11-02 – 2011-11-04 (×6): 25 mg via ORAL
  Filled 2011-11-02 (×8): qty 1

## 2011-11-02 MED ORDER — POTASSIUM CHLORIDE 10 MEQ/50ML IV SOLN
10.0000 meq | INTRAVENOUS | Status: AC
  Administered 2011-11-02 (×2): 10 meq via INTRAVENOUS

## 2011-11-02 MED ORDER — HYDROMORPHONE HCL PF 1 MG/ML IJ SOLN: 0.2500 mg | INTRAMUSCULAR | Status: DC | PRN

## 2011-11-02 NOTE — Progress Notes (Signed)
1 Day Post-Op Procedure(s) (LRB): CORONARY ARTERY BYPASS GRAFTING (CABG) (N/A) Subjective: Mild discomfort  Objective: Vital signs in last 24 hours: Temp:  [96.6 F (35.9 C)-99.3 F (37.4 C)] 98.6 F (37 C) (04/27 0800) Pulse Rate:  [82-100] 89  (04/27 0800) Cardiac Rhythm:  [-] Normal sinus rhythm (04/27 0700) Resp:  [11-25] 22  (04/27 0800) BP: (104-148)/(55-71) 129/58 mmHg (04/26 2221) SpO2:  [94 %-100 %] 96 % (04/27 0800) Arterial Line BP: (87-147)/(42-71) 126/57 mmHg (04/27 0800) FiO2 (%):  [40 %-50 %] 40 % (04/26 1748) Weight:  [231 lb 4.2 oz (104.9 kg)-245 lb 6 oz (111.3 kg)] 245 lb 6 oz (111.3 kg) (04/27 0500)  Hemodynamic parameters for last 24 hours: PAP: (19-47)/(11-27) 42/18 mmHg CO:  [3.9 L/min-5.8 L/min] 5.2 L/min CI:  [1.7 L/min/m2-2.3 L/min/m2] 2.3 L/min/m2  Intake/Output from previous day: 04/26 0701 - 04/27 0700 In: 5226.1 [P.O.:240; I.V.:3757.6; Blood:475; IV Piggyback:650] Out: 4615 [Urine:2895; Blood:1450; Chest Tube:270] Intake/Output this shift: Total I/O In: 40 [I.V.:40] Out: -   General appearance: alert and no distress Neurologic: intact Heart: regular rate and rhythm Lungs: diminished breath sounds bibasilar Abdomen: normal findings: soft, non-tender  Lab Results:  Basename 11/02/11 0358 11/01/11 1923 11/01/11 1918  WBC 9.2 -- 8.3  HGB 11.6* 11.6* --  HCT 34.8* 34.0* --  PLT 173 -- 164   BMET:  Basename 11/02/11 0358 11/01/11 1923 11/01/11 0525  NA 133* 134* --  K 4.4 4.4 --  CL 101 104 --  CO2 23 -- 23  GLUCOSE 132* 124* --  BUN 9 8 --  CREATININE 0.99 0.90 --  CALCIUM 8.9 -- 9.6    PT/INR:  Basename 11/01/11 1344  LABPROT 16.2*  INR 1.27   ABG    Component Value Date/Time   PHART 7.320* 11/01/2011 1927   HCO3 22.2 11/01/2011 1927   TCO2 23 11/01/2011 1927   ACIDBASEDEF 4.0* 11/01/2011 1927   O2SAT 97.0 11/01/2011 1927   CBG (last 3)   Basename 11/02/11 0739 11/01/11 2322 11/01/11 2218  GLUCAP 115* 115* 137*     Assessment/Plan: S/P Procedure(s) (LRB): CORONARY ARTERY BYPASS GRAFTING (CABG) (N/A) POD # 1 CABG x 4 CV- stable, d/c swan RESP - pulmonary toilet RENAL- lytes, creatinine OK- diurese D/C CT CBG OK change to q4 CBG OOB, ambulate   LOS: 8 days    Kristyana Notte C 11/02/2011

## 2011-11-02 NOTE — Progress Notes (Signed)
Patient ID: Jesus Maldonado, male   DOB: [birth year], 78 y.o.   MRN: [id number] Stable day BP 100/58  Pulse 84  Temp(Src) 98 F (36.7 C) (Oral)  Resp 22  Ht 5\' 10"  (1.778 m)  Wt 245 lb 6 oz (111.3 kg)  BMI 35.21 kg/m2  SpO2 95%  Intake/Output Summary (Last 24 hours) at 11/02/11 1734 Last data filed at 11/02/11 1700  Gross per 24 hour  Intake 2175.05 ml  Output   3190 ml  Net -1014.95 ml   Labs OK

## 2011-11-03 ENCOUNTER — Inpatient Hospital Stay (HOSPITAL_COMMUNITY): Payer: Medicare Other

## 2011-11-03 LAB — BASIC METABOLIC PANEL
BUN: 9 mg/dL (ref 6–23)
GFR calc non Af Amer: 77 mL/min — ABNORMAL LOW (ref >90)
Glucose, Bld: 94 mg/dL (ref 70–99)
Potassium: 3.7 mEq/L (ref 3.5–5.1)

## 2011-11-03 LAB — GLUCOSE, CAPILLARY
Glucose-Capillary: 109 mg/dL — ABNORMAL HIGH (ref 70–99)
Glucose-Capillary: 101 mg/dL — ABNORMAL HIGH (ref 70–99)
Glucose-Capillary: 114 mg/dL — ABNORMAL HIGH (ref 70–99)
Glucose-Capillary: 115 mg/dL — ABNORMAL HIGH (ref 70–99)

## 2011-11-03 LAB — CBC
HCT: 34 % — ABNORMAL LOW (ref 39.0–52.0)
Hemoglobin: 11.4 g/dL — ABNORMAL LOW (ref 13.0–17.0)
MCH: 29.7 pg (ref 26.0–34.0)
MCHC: 33.5 g/dL (ref 30.0–36.0)
MCV: 88.5 fL (ref 78.0–100.0)

## 2011-11-03 MED ORDER — POTASSIUM CHLORIDE 10 MEQ/50ML IV SOLN
10.0000 meq | INTRAVENOUS | Status: AC
  Administered 2011-11-03 (×3): 10 meq via INTRAVENOUS
  Filled 2011-11-03 (×2): qty 50

## 2011-11-03 MED ORDER — MAGNESIUM HYDROXIDE 400 MG/5ML PO SUSP: 30.0000 mL | Freq: Every day | ORAL | Status: DC | PRN

## 2011-11-03 MED ORDER — RAMIPRIL 2.5 MG PO CAPS
2.5000 mg | ORAL_CAPSULE | Freq: Every day | ORAL | Status: DC
  Administered 2011-11-04 – 2011-11-05 (×2): 2.5 mg via ORAL
  Filled 2011-11-03 (×2): qty 1

## 2011-11-03 MED ORDER — SODIUM CHLORIDE 0.9 % IJ SOLN: 3.0000 mL | INTRAMUSCULAR | Status: DC | PRN

## 2011-11-03 MED ORDER — MOVING RIGHT ALONG BOOK
Freq: Once | Status: AC
  Administered 2011-11-03: 16:00:00
  Filled 2011-11-03: qty 1

## 2011-11-03 MED ORDER — GUAIFENESIN ER 600 MG PO TB12
1200.0000 mg | ORAL_TABLET | Freq: Two times a day (BID) | ORAL | Status: AC
  Administered 2011-11-03 – 2011-11-05 (×6): 1200 mg via ORAL
  Filled 2011-11-03 (×6): qty 2

## 2011-11-03 MED ORDER — SODIUM CHLORIDE 0.9 % IJ SOLN
3.0000 mL | Freq: Two times a day (BID) | INTRAMUSCULAR | Status: DC
  Administered 2011-11-03 – 2011-11-10 (×12): 3 mL via INTRAVENOUS

## 2011-11-03 MED ORDER — ZOLPIDEM TARTRATE 5 MG PO TABS
5.0000 mg | ORAL_TABLET | Freq: Every evening | ORAL | Status: DC | PRN
  Administered 2011-11-03 – 2011-11-04 (×2): 5 mg via ORAL
  Filled 2011-11-03 (×2): qty 1

## 2011-11-03 MED ORDER — POTASSIUM CHLORIDE CRYS ER 20 MEQ PO TBCR
40.0000 meq | EXTENDED_RELEASE_TABLET | Freq: Two times a day (BID) | ORAL | Status: AC
  Administered 2011-11-03 (×2): 40 meq via ORAL

## 2011-11-03 MED ORDER — SODIUM CHLORIDE 0.9 % IV SOLN: 250.0000 mL | INTRAVENOUS | Status: DC | PRN

## 2011-11-03 MED ORDER — FUROSEMIDE 40 MG PO TABS
40.0000 mg | ORAL_TABLET | Freq: Every day | ORAL | Status: DC
  Administered 2011-11-03 – 2011-11-05 (×3): 40 mg via ORAL
  Filled 2011-11-03 (×3): qty 1

## 2011-11-03 MED ORDER — POTASSIUM CHLORIDE CRYS ER 20 MEQ PO TBCR
20.0000 meq | EXTENDED_RELEASE_TABLET | Freq: Two times a day (BID) | ORAL | Status: DC
  Administered 2011-11-04 – 2011-11-07 (×8): 20 meq via ORAL
  Filled 2011-11-03 (×5): qty 1
  Filled 2011-11-03: qty 2
  Filled 2011-11-03 (×5): qty 1

## 2011-11-03 MED ORDER — INSULIN ASPART 100 UNIT/ML ~~LOC~~ SOLN
0.0000 [IU] | Freq: Three times a day (TID) | SUBCUTANEOUS | Status: DC
  Administered 2011-11-07 (×2): 2 [IU] via SUBCUTANEOUS

## 2011-11-03 MED ORDER — POTASSIUM CHLORIDE 10 MEQ/50ML IV SOLN
INTRAVENOUS | Status: AC
  Filled 2011-11-03: qty 50

## 2011-11-03 MED ORDER — TRAMADOL HCL 50 MG PO TABS
50.0000 mg | ORAL_TABLET | ORAL | Status: DC | PRN
Start: 2011-11-03 — End: 2011-11-10
  Administered 2011-11-06: 50 mg via ORAL
  Filled 2011-11-03: qty 2

## 2011-11-03 NOTE — Progress Notes (Signed)
Report called to 2000 RN 

## 2011-11-03 NOTE — Progress Notes (Signed)
Transferred to 2012 on monitor and in wheelchair, placed on tele, SCD to left leg, placed in bed, RN to receive in room

## 2011-11-03 NOTE — Progress Notes (Signed)
Patient transferred from 2300 and RN reported that foley was removed at 0930. Patient only had 60 ml output since foley was discontinued. Patient's bladder was scanned and there was only 23 ml in his bladder. Lajuana Matte, RN

## 2011-11-03 NOTE — Progress Notes (Signed)
2 Days Post-Op Procedure(s) (LRB): CORONARY ARTERY BYPASS GRAFTING (CABG) (N/A) Subjective: Some pain , but "not too bad" Denies nausea  Objective: Vital signs in last 24 hours: Temp:  [97.7 F (36.5 C)-99.1 F (37.3 C)] 98 F (36.7 C) (04/28 0742) Pulse Rate:  [82-95] 88  (04/28 0800) Cardiac Rhythm:  [-] Normal sinus rhythm (04/28 0747) Resp:  [18-27] 18  (04/28 0800) BP: (100-132)/(50-71) 103/56 mmHg (04/28 0800) SpO2:  [93 %-97 %] 95 % (04/28 0800) Arterial Line BP: (138-145)/(56-59) 145/59 mmHg (04/27 1000) Weight:  [240 lb 15.4 oz (109.3 kg)] 240 lb 15.4 oz (109.3 kg) (04/28 0500)  Hemodynamic parameters for last 24 hours: PAP: (37-45)/(13-19) 37/13 mmHg  Intake/Output from previous day: 04/27 0701 - 04/28 0700 In: 2050 [P.O.:1260; I.V.:490; IV Piggyback:300] Out: 2755 [Urine:2755] Intake/Output this shift: Total I/O In: 50 [IV Piggyback:50] Out: 50 [Urine:50]  General appearance: alert and no distress Neurologic: intact Heart: regular rate and rhythm Lungs: diminished breath sounds bibasilar Abdomen: normal findings: mildly distended, nontender Wound: dressing clean and dry  Lab Results:  Basename 11/03/11 0330 11/02/11 1545  WBC 11.1* 11.3*  HGB 11.4* 12.0*  HCT 34.0* 34.7*  PLT 174 183   BMET:  Basename 11/03/11 0330 11/02/11 1545 11/02/11 1541 11/02/11 0358  NA 133* -- 134* --  K 3.7 -- 4.1 --  CL 97 -- 97 --  CO2 27 -- -- 23  GLUCOSE 94 -- 124* --  BUN 9 -- 8 --  CREATININE 0.97 1.06 -- --  CALCIUM 8.6 -- -- 8.9    PT/INR:  Basename 11/01/11 1344  LABPROT 16.2*  INR 1.27   ABG    Component Value Date/Time   PHART 7.320* 11/01/2011 1927   HCO3 22.2 11/01/2011 1927   TCO2 27 11/02/2011 1541   ACIDBASEDEF 4.0* 11/01/2011 1927   O2SAT 97.0 11/01/2011 1927   CBG (last 3)   Basename 11/03/11 0743 11/03/11 0342 11/02/11 2324  GLUCAP 109* 103* 180*    Assessment/Plan: S/P Procedure(s) (LRB): CORONARY ARTERY BYPASS GRAFTING (CABG)  (N/A) Plan for transfer to step-down: see transfer orders CV- stable RESP - sats OK, c/o difficulty clearing mucous- add mucinex RENAL- creatinine normal, continue diuresis CBG control adequate Transfer to 2000   LOS: 9 days    Kahmari Koller C 11/03/2011

## 2011-11-03 NOTE — Progress Notes (Signed)
Patient ambulated in the hallway with RW 150 feet. Patient tolerated well.  Will continue to monitor. Lajuana Matte, RN

## 2011-11-04 ENCOUNTER — Encounter (HOSPITAL_COMMUNITY): Payer: Self-pay | Admitting: Thoracic Surgery (Cardiothoracic Vascular Surgery)

## 2011-11-04 LAB — GLUCOSE, CAPILLARY
Glucose-Capillary: 101 mg/dL — ABNORMAL HIGH (ref 70–99)
Glucose-Capillary: 123 mg/dL — ABNORMAL HIGH (ref 70–99)

## 2011-11-04 LAB — BASIC METABOLIC PANEL
CO2: 25 mEq/L (ref 19–32)
Chloride: 98 mEq/L (ref 96–112)
Creatinine, Ser: 0.97 mg/dL (ref 0.50–1.35)
GFR calc Af Amer: 89 mL/min — ABNORMAL LOW (ref >90)
Potassium: 4.2 mEq/L (ref 3.5–5.1)
Sodium: 132 mEq/L — ABNORMAL LOW (ref 135–145)

## 2011-11-04 LAB — CBC
MCV: 88.2 fL (ref 78.0–100.0)
Platelets: 205 10*3/uL (ref 150–400)
RBC: 3.98 MIL/uL — ABNORMAL LOW (ref 4.22–5.81)
RDW: 13.6 % (ref 11.5–15.5)
WBC: 11.8 10*3/uL — ABNORMAL HIGH (ref 4.0–10.5)

## 2011-11-04 MED ORDER — POTASSIUM CHLORIDE CRYS ER 20 MEQ PO TBCR: 20.0000 meq | EXTENDED_RELEASE_TABLET | Freq: Every day | ORAL | Status: DC

## 2011-11-04 MED ORDER — AMIODARONE HCL IN DEXTROSE 360-4.14 MG/200ML-% IV SOLN
60.0000 mg/h | INTRAVENOUS | Status: AC
  Administered 2011-11-04: 60 mg/h via INTRAVENOUS
  Filled 2011-11-04 (×2): qty 200

## 2011-11-04 MED ORDER — OXYCODONE HCL 5 MG PO TABS: 5.0000 mg | ORAL_TABLET | ORAL | Status: AC | PRN

## 2011-11-04 MED ORDER — BENZONATATE 100 MG PO CAPS
100.0000 mg | ORAL_CAPSULE | Freq: Three times a day (TID) | ORAL | Status: DC | PRN
  Filled 2011-11-04: qty 1

## 2011-11-04 MED ORDER — METOPROLOL TARTRATE 25 MG PO TABS: 25.0000 mg | ORAL_TABLET | Freq: Two times a day (BID) | ORAL | Status: DC

## 2011-11-04 MED ORDER — HYDROCOD POLST-CHLORPHEN POLST 10-8 MG/5ML PO LQCR
5.0000 mL | Freq: Two times a day (BID) | ORAL | Status: DC
  Administered 2011-11-04 – 2011-11-10 (×12): 5 mL via ORAL
  Filled 2011-11-04 (×13): qty 5

## 2011-11-04 MED ORDER — AMIODARONE LOAD VIA INFUSION
150.0000 mg | Freq: Once | INTRAVENOUS | Status: AC
  Administered 2011-11-04: 150 mg via INTRAVENOUS
  Filled 2011-11-04: qty 83.34

## 2011-11-04 MED ORDER — FUROSEMIDE 40 MG PO TABS: 40.0000 mg | ORAL_TABLET | Freq: Every day | ORAL | Status: DC

## 2011-11-04 MED ORDER — AMIODARONE HCL IN DEXTROSE 360-4.14 MG/200ML-% IV SOLN
30.0000 mg/h | INTRAVENOUS | Status: DC
  Administered 2011-11-05 – 2011-11-06 (×7): 30 mg/h via INTRAVENOUS
  Filled 2011-11-04 (×13): qty 200

## 2011-11-04 MED FILL — Magnesium Sulfate Inj 50%: INTRAMUSCULAR | Qty: 10 | Status: AC

## 2011-11-04 MED FILL — Potassium Chloride Inj 2 mEq/ML: INTRAVENOUS | Qty: 40 | Status: AC

## 2011-11-04 NOTE — Discharge Summary (Signed)
[address]            [name] [address]          [phone]         Discharge Summary  Name: Jesus Maldonado DOB: 25-May-1931 78 y.o. MRN: [id number]  Admission Date: 10/25/2011 Discharge Date:    Admitting Diagnosis: Non-ST segment elevation myocardial infarction   Discharge Diagnosis:  Non-ST segment elevation myocardial infarction Severe three-vessel coronary artery disease Hypertension Hyperlipidemia History of coronary artery disease, status post PCI 10 years ago Arthritis  Procedures: Procedure(s): CORONARY ARTERY BYPASS GRAFTING (CABG) x 4 (left internal mammary artery to LAD, saphenous vein graft to posterior descending,  Saphenous vein graft to first diagonal, saphenous vein graft to obtuse marginal 1), endoscopic vein harvest right leg on 11/01/2011   HPI:  The patient is a 77 y.o. male with a history of CAD. He had 2 stents placed about 10 yrs ago in [place], [place]. He had done well from a cardiac standpoint since that time. He moved to [place] about 6 months ago to be close to family. He first noted chest pain 2 weeks ago. It came on at rest and resolved spontaneously. He was awakened from sleep at 3 AM on 4/19 with severe pain originating in his left chest and radiating around the chest as well as down the arm. He experienced diaphoresis and shortness of breath. He took some old nitroglycerin without relief and then developed nausea and vomiting. His daughter spoke with him the next morning and knew he was not feeling well. She took him to [facility]. He had a positive troponin. He was loaded with Plavix and treated with aspirin, heparin and nitroglycerin, then transferred to [facility] for further cardiac workup.    Hospital Course:  The patient was admitted to [facility] on 10/25/2011. His chest pain had resolved. He underwent cardiac catheterization on 10/28/2011 by Dr. Clifton James, which showed severe three-vessel coronary  artery disease which was not felt to be amenable to percutaneous intervention. A previous echocardiogram showed a well-preserved left ventricular ejection fraction, 60-65%. A cardiac surgery consult was requested and the patient was seen by Dr. Charlett Lango. It was felt that he would benefit from surgical revascularization after a brief Plavix washout period. All risks, benefits and alternatives of surgery were explained in detail, and the patient agreed to proceed.   The patient was taken to the operating room and underwent the above procedure.    The postoperative course has been uneventful. His chest tubes and hemodynamic monitoring lines were removed on postop day 2 and he was transferred to the floor. He has been diuresed for a volume overload. He has been started on a beta blocker, an ACE inhibitor, and a statin. He is walking in the halls with cardiac rehabilitation phase 1. He is tolerating a regular diet. He has been weaned from supplemental oxygen. We anticipate discharge home within the next 24-48 hours provided no acute changes occur.    Recent vital signs:  Filed Vitals:   11/04/11 0426  BP: 117/71  Pulse: 104  Temp: 98.4 F (36.9 C)  Resp: 19    Recent laboratory studies:  CBC: Basename 11/04/11 0515 11/03/11 0330  WBC 11.8* 11.1*  HGB 12.1* 11.4*  HCT 35.1* 34.0*  PLT 205 174   BMET:  Basename 11/04/11 0515 11/03/11 0330  NA 132* 133*  K 4.2 3.7  CL 98 97  CO2 25 27  GLUCOSE 98 94  BUN 12 9  CREATININE 0.97 0.97  CALCIUM 9.1 8.6    PT/INR:  Basename 11/01/11 1344  LABPROT 16.2*  INR 1.27    Discharge Medications:   Medication List  As of 11/04/2011  9:41 AM   STOP taking these medications         nitroGLYCERIN 0.4 MG SL tablet         TAKE these medications         aspirin 325 MG tablet   Take 325 mg by mouth daily.      atorvastatin 40 MG tablet   Commonly known as: LIPITOR   Take 40 mg by mouth at bedtime.      cimetidine 800 MG tablet     Commonly known as: TAGAMET   Take 800 mg by mouth 2 (two) times daily.      furosemide 40 MG tablet   Commonly known as: LASIX   Take 1 tablet (40 mg total) by mouth daily. X 1 week      metoprolol tartrate 25 MG tablet   Commonly known as: LOPRESSOR   Take 1 tablet (25 mg total) by mouth 2 (two) times daily.      oxyCODONE 5 MG immediate release tablet   Commonly known as: Oxy IR/ROXICODONE   Take 1-2 tablets (5-10 mg total) by mouth every 3 (three) hours as needed for pain.      potassium chloride SA 20 MEQ tablet   Commonly known as: K-DUR,KLOR-CON   Take 1 tablet (20 mEq total) by mouth daily. X 1 week      ramipril 2.5 MG capsule   Commonly known as: ALTACE   Take 2.5 mg by mouth daily.            Discharge Instructions:  The patient is to refrain from driving, heavy lifting or strenuous activity.  May shower daily and clean incisions with soap and water.  May resume regular diet.  Discharge Orders    Future Appointments: Provider: Department: Dept Phone: Center:   11/26/2011 10:45 AM Loreli Slot, MD [facility] [phone] [facility]      Follow-up Information    Follow up with [facility], MD. Schedule an appointment as soon as possible for a visit in 2 weeks.   Contact information:   [facility] [address] [place] [address] [phone]       Follow up with Loreli Slot, MD on 11/26/2011. (Have an x-ray at 9:45, then see MD at 10:45)    Contact information:   [address] [place] [address] [phone]           Adella Hare 11/04/2011, 9:41 AM

## 2011-11-04 NOTE — Progress Notes (Signed)
Patient had a small burst runs of SVT with rate in 155,s. Was symptomatic, complain of SOB. o2 sats is 95% on room air, patient palced on 2lits of o2. Stat  Of 99%. Patient remains in sinus tach. Rates between 114-120. Vital signs . B/p 125/76, temp 98.5 resp. 24. i have page the [facility] office for the doctor on call twice but still awaiting responds. Patient seems comfortable now, admits SOB has resolve denies any pain. i will report episode to oncoming nurse to continue to follow up and try to notify physician on call.

## 2011-11-04 NOTE — Progress Notes (Signed)
Pt  HR shot up to 219, pt denies any complications, vital signs are stable 97.6, hr 123 but pt has been in ST all day, BP 114878 O2 97 2 L. Dr Cornelius Moras was on call for Dr. Dorris Fetch and ordered an ami drip with bolus per protocol. Will continue to monitor.   Jesus Maldonado M

## 2011-11-04 NOTE — Progress Notes (Addendum)
3 Days Post-Op Procedure(s) (LRB): CORONARY ARTERY BYPASS GRAFTING (CABG) (N/A) Subjective:  Jesus Maldonado complains of incisional pain when he coughs.  Otherwise he states he is doing well.  Patient is no longer on oxygen  Objective: Vital signs in last 24 hours: Temp:  [98 F (36.7 C)-98.4 F (36.9 C)] 98.4 F (36.9 C) (04/29 0426) Pulse Rate:  [88-112] 104  (04/29 0426) Cardiac Rhythm:  [-] Sinus tachycardia (04/28 2000) Resp:  [18-21] 19  (04/29 0426) BP: (103-135)/(56-86) 117/71 mmHg (04/29 0426) SpO2:  [92 %-95 %] 95 % (04/29 0426)  Intake/Output from previous day: 04/28 0701 - 04/29 0700 In: 400 [P.O.:300; IV Piggyback:100] Out: 1165 [Urine:1165]    General appearance: alert, cooperative and no distress Heart: regular rate and rhythm Lungs: clear to auscultation bilaterally Abdomen: soft, non-tender; bowel sounds normal; no masses,  no organomegaly Extremities: edema 1+ Wound: clean and dry  Lab Results:  Basename 11/04/11 0515 11/03/11 0330  WBC 11.8* 11.1*  HGB 12.1* 11.4*  HCT 35.1* 34.0*  PLT 205 174   BMET:  Basename 11/04/11 0515 11/03/11 0330  NA 132* 133*  K 4.2 3.7  CL 98 97  CO2 25 27  GLUCOSE 98 94  BUN 12 9  CREATININE 0.97 0.97  CALCIUM 9.1 8.6    PT/INR:  Basename 11/01/11 1344  LABPROT 16.2*  INR 1.27   ABG    Component Value Date/Time   PHART 7.320* 11/01/2011 1927   HCO3 22.2 11/01/2011 1927   TCO2 27 11/02/2011 1541   ACIDBASEDEF 4.0* 11/01/2011 1927   O2SAT 97.0 11/01/2011 1927   CBG (last 3)   Basename 11/04/11 0628 11/03/11 2133 11/03/11 1617  GLUCAP 101* 115* 114*    Assessment/Plan: S/P Procedure(s) (LRB): CORONARY ARTERY BYPASS GRAFTING (CABG) (N/A)  2. CV- NSR, tachy, pressure controlled, on Metoprolol 25mg  BID 3. Resp- not requiring oxygen, will continue aggressive IS, Mucinex 4. CBGs- well controlled, continue regimen 5. Fluid Balance- patient with good UO, normal creatinine, + peripheral edema, will continue Lasix  for now 6. Dispo- patient doing well, will monitor, if HR remains NSR will D/c EPW in the morning.  Likely d/c home in next 24-48 hours    LOS: 10 days    BARRETT, ERIN 11/04/2011   Patient seen and examined, agree with above C/o cough. On mucinex will add tussionex and tessalon

## 2011-11-04 NOTE — Progress Notes (Signed)
CARDIAC REHAB PHASE I   PRE:  Rate/Rhythm: 109ST  BP:  Supine: 100/70  Sitting:   Standing:    SaO2: 94%RA  MODE:  Ambulation: 350 ft   POST:  Rate/Rhythem: 120  BP:  Supine:   Sitting: 100/70  Standing:    SaO2: 96%RA 0955-1030 Pt very sleepy and slow to respond. Said he cannot sleep due to cough. Walked 350 ft on RA  With rolling walker and asst x 2. Encouraged pt to stand upright and close to walker. To recliner after walk. Notified staff that pt in chair. Told pt not to get up by himself.  Can be asst x 1 if more awake next walk.  Jesus Maldonado

## 2011-11-04 NOTE — Progress Notes (Signed)
UR Completed.  Jesus Maldonado Jane [phone] 11/04/2011  

## 2011-11-05 ENCOUNTER — Inpatient Hospital Stay (HOSPITAL_COMMUNITY): Payer: Medicare Other

## 2011-11-05 LAB — GLUCOSE, CAPILLARY
Glucose-Capillary: 108 mg/dL — ABNORMAL HIGH (ref 70–99)
Glucose-Capillary: 107 mg/dL — ABNORMAL HIGH (ref 70–99)
Glucose-Capillary: 118 mg/dL — ABNORMAL HIGH (ref 70–99)

## 2011-11-05 LAB — BLOOD GAS, ARTERIAL
Drawn by: 257081
O2 Content: 2 L/min
O2 Saturation: 98.2 %
Patient temperature: 98.6
pH, Arterial: 7.392 (ref 7.350–7.450)

## 2011-11-05 LAB — TYPE AND SCREEN: Antibody Screen: NEGATIVE

## 2011-11-05 LAB — CBC
HCT: 36.3 % — ABNORMAL LOW (ref 39.0–52.0)
MCH: 29.6 pg (ref 26.0–34.0)
MCV: 89.4 fL (ref 78.0–100.0)
Platelets: 300 10*3/uL (ref 150–400)
RDW: 13.9 % (ref 11.5–15.5)
WBC: 11.5 10*3/uL — ABNORMAL HIGH (ref 4.0–10.5)

## 2011-11-05 LAB — COMPREHENSIVE METABOLIC PANEL
Albumin: 2.7 g/dL — ABNORMAL LOW (ref 3.5–5.2)
BUN: 16 mg/dL (ref 6–23)
CO2: 24 mEq/L (ref 19–32)
Calcium: 9 mg/dL (ref 8.4–10.5)
Chloride: 98 mEq/L (ref 96–112)
Creatinine, Ser: 1 mg/dL (ref 0.50–1.35)
GFR calc non Af Amer: 70 mL/min — ABNORMAL LOW (ref >90)
Total Bilirubin: 0.7 mg/dL (ref 0.3–1.2)

## 2011-11-05 LAB — URINE MICROSCOPIC-ADD ON

## 2011-11-05 LAB — URINALYSIS, ROUTINE W REFLEX MICROSCOPIC
Glucose, UA: NEGATIVE mg/dL
Hgb urine dipstick: NEGATIVE
Specific Gravity, Urine: 1.043 — ABNORMAL HIGH (ref 1.005–1.030)
pH: 5 (ref 5.0–8.0)

## 2011-11-05 MED ORDER — VANCOMYCIN HCL 1000 MG IV SOLR
1500.0000 mg | INTRAVENOUS | Status: DC
  Filled 2011-11-05: qty 1500

## 2011-11-05 MED ORDER — LEVALBUTEROL HCL 0.63 MG/3ML IN NEBU
0.6300 mg | INHALATION_SOLUTION | Freq: Four times a day (QID) | RESPIRATORY_TRACT | Status: DC | PRN
  Administered 2011-11-05: 0.63 mg via RESPIRATORY_TRACT
  Filled 2011-11-05 (×2): qty 3

## 2011-11-05 MED ORDER — METOPROLOL TARTRATE 50 MG PO TABS
50.0000 mg | ORAL_TABLET | Freq: Two times a day (BID) | ORAL | Status: DC
  Administered 2011-11-05 – 2011-11-10 (×10): 50 mg via ORAL
  Filled 2011-11-05 (×12): qty 1

## 2011-11-05 MED ORDER — FLEET ENEMA 7-19 GM/118ML RE ENEM
1.0000 | ENEMA | Freq: Once | RECTAL | Status: AC
  Administered 2011-11-05: 1 via RECTAL
  Filled 2011-11-05: qty 1

## 2011-11-05 MED ORDER — METOPROLOL TARTRATE 25 MG/10 ML ORAL SUSPENSION
50.0000 mg | Freq: Two times a day (BID) | ORAL | Status: DC
  Filled 2011-11-05 (×12): qty 20

## 2011-11-05 MED ORDER — POLYETHYLENE GLYCOL 3350 17 G PO PACK
17.0000 g | PACK | Freq: Once | ORAL | Status: AC
  Administered 2011-11-05: 17 g via ORAL
  Filled 2011-11-05: qty 1

## 2011-11-05 MED FILL — Sodium Chloride IV Soln 0.9%: INTRAVENOUS | Qty: 1000 | Status: AC

## 2011-11-05 MED FILL — Electrolyte-R (PH 7.4) Solution: INTRAVENOUS | Qty: 4000 | Status: AC

## 2011-11-05 MED FILL — Heparin Sodium (Porcine) Inj 1000 Unit/ML: INTRAMUSCULAR | Qty: 30 | Status: AC

## 2011-11-05 MED FILL — Sodium Chloride Irrigation Soln 0.9%: Qty: 3000 | Status: AC

## 2011-11-05 MED FILL — Mannitol IV Soln 20%: INTRAVENOUS | Qty: 500 | Status: AC

## 2011-11-05 MED FILL — Heparin Sodium (Porcine) Inj 1000 Unit/ML: INTRAMUSCULAR | Qty: 10 | Status: AC

## 2011-11-05 MED FILL — Lidocaine HCl IV Inj 20 MG/ML: INTRAVENOUS | Qty: 5 | Status: AC

## 2011-11-05 MED FILL — Sodium Bicarbonate IV Soln 8.4%: INTRAVENOUS | Qty: 50 | Status: AC

## 2011-11-05 NOTE — Progress Notes (Addendum)
Jesus Maldonado told the Rn earlier this afternoon he felt something pull or pop in his chest. CXR showed displacement of lowermost sternal wire.  BP 98/64  Pulse 96  Temp(Src) 97.4 F (36.3 C) (Oral)  Resp 22  Ht 5\' 10"  (1.778 m)  Wt 243 lb 13.3 oz (110.6 kg)  BMI 34.99 kg/m2  SpO2 99% On exam he is in moderate pain. His upper sternum is intact but at the lower portion there is clearly instability with clicking and motion with coughing.  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 I discussed with him the risks of not rewiring, nonunion, chronic pain and sternal infection. Although there is a risk of infection with rewiring it is extremely low given that the would is currently intact. There are also those risks associated with any surgical procedure requiring general anesthesia including death, stroke, MI, bleeding, DVT/PE.  He agrees to proceed. We will schedule for 1st case in AM.  Addendum- although I don't think it is the primary cause of his cough, I will discontinue Altace as it certainly can be a cause or exacerbating factor for a cough

## 2011-11-05 NOTE — Progress Notes (Signed)
   CARE MANAGEMENT NOTE 11/05/2011  Patient:  Jesus Maldonado, Jesus Maldonado   Account Number:  [id number]  Date Initiated:  11/01/2011  Documentation initiated by:  GRAVES-BIGELOW,BRENDA  Subjective/Objective Assessment:   Pt admitted with cp. Has family support. S/p LHC-/22/13 to have triple vessel CAD with totally occluded RCA and LAD and high grade disease in ostium of OM1. Plan CABG 4-26.     Action/Plan:   PTA, PT INDEPENDENT, LIVES ALONE.  HE STATES HE WILL STAY WITH HIS DAUGHTER AT [place].  HE REQUESTS RW FOR HOME.   Anticipated DC Date:  11/07/2011   Anticipated DC Plan:  HOME W HOME HEALTH SERVICES      DC Planning Services  CM consult      Choice offered to / List presented to:     DME arranged  Levan Hurst      DME agency  [facility]        Status of service:  In process, will continue to follow Medicare Important Message given?   (If response is "NO", the following Medicare IM given date fields will be blank) Date Medicare IM given:   Date Additional Medicare IM given:    Discharge Disposition:    Per UR Regulation:    If discussed at Long Length of Stay Meetings, dates discussed:    Comments:  11/04/11 Jesus Bralley,RN,BSN 1400 REFERRAL TO [facility] FOR DME NEEDS. Phone #[phone]   11-01-11 [phone] Jesus Bamberger, RN,BSN [phone] CM will continue to monitor for d/c disposition.

## 2011-11-05 NOTE — Progress Notes (Signed)
CARDIAC REHAB PHASE I   PRE:  Rate/Rhythm: 108 ST    BP: sitting 124/70    SaO2: 96 RA  MODE:  Ambulation: 460 ft   POST:  Rate/Rhythm: 122 ST    BP: sitting 120/70     SaO2: 98 RA  Pt continues to be groggy. Family sts he takes sleep aid every night, even PTA. Cooperative. Lots of coughing due to congestion, some production of phlegm. Used RW, assist x2. HR up to 122 ST stable. No specific c/o. [id number]  Harriet Masson CES, ACSM

## 2011-11-05 NOTE — Progress Notes (Addendum)
[address]            [place] [address]          [phone]     4 Days Post-Op  Procedure(s) (LRB): CORONARY ARTERY BYPASS GRAFTING (CABG) (N/A) Subjective: Some pulmonary congestion, bursts of SVT-now on amiodarone IV  Objective  Telemetry SVT, currently stachy PAC's   Temp:  [97.6 F (36.4 C)-99.2 F (37.3 C)] 98 F (36.7 C) (04/30 0506) Pulse Rate:  [101-112] 104  (04/30 0506) Resp:  [20-27] 21  (04/30 0506) BP: (95-133)/(60-82) 133/82 mmHg (04/30 0506) SpO2:  [93 %-97 %] 95 % (04/30 0506) Weight:  [243 lb 13.3 oz (110.6 kg)] 243 lb 13.3 oz (110.6 kg) (04/30 0506)   Intake/Output Summary (Last 24 hours) at 11/05/11 0738 Last data filed at 11/04/11 2100  Gross per 24 hour  Intake    600 ml  Output    875 ml  Net   -275 ml       General appearance: alert, cooperative and no distress Heart: irregularly irregular rhythm and S1, S2 normal Lungs: ronchi in upper airways, clears well with cough Abdomen: soft, mild distension, + BS, nontender Extremities: minor edema Wound: incisions healing well   Lab Results:  Basename 11/04/11 0515 11/03/11 0330 11/02/11 1545  NA 132* 133* --  K 4.2 3.7 --  CL 98 97 --  CO2 25 27 --  GLUCOSE 98 94 --  BUN 12 9 --  CREATININE 0.97 0.97 --  CALCIUM 9.1 8.6 --  MG -- -- 2.0  PHOS -- -- --   No results found for this basename: AST:2,ALT:2,ALKPHOS:2,BILITOT:2,PROT:2,ALBUMIN:2 in the last 72 hours No results found for this basename: LIPASE:2,AMYLASE:2 in the last 72 hours  Basename 11/04/11 0515 11/03/11 0330  WBC 11.8* 11.1*  NEUTROABS -- --  HGB 12.1* 11.4*  HCT 35.1* 34.0*  MCV 88.2 88.5  PLT 205 174   No results found for this basename: CKTOTAL:4,CKMB:4,TROPONINI:4 in the last 72 hours No components found with this basename: POCBNP:3 No results found for this basename: DDIMER in the last 72 hours No results found for this basename: HGBA1C in the last 72 hours No results found  for this basename: CHOL,HDL,LDLCALC,TRIG,CHOLHDL in the last 72 hours No results found for this basename: TSH,T4TOTAL,FREET3,T3FREE,THYROIDAB in the last 72 hours No results found for this basename: VITAMINB12,FOLATE,FERRITIN,TIBC,IRON,RETICCTPCT in the last 72 hours  Medications: Scheduled    . acetaminophen  1,000 mg Oral Q6H   Or  . acetaminophen (TYLENOL) oral liquid 160 mg/5 mL  975 mg Per Tube Q6H  . amiodarone  150 mg Intravenous Once  . aspirin EC  325 mg Oral Daily   Or  . aspirin  324 mg Per Tube Daily  . atorvastatin  40 mg Oral q1800  . bisacodyl  10 mg Oral Daily   Or  . bisacodyl  10 mg Rectal Daily  . chlorpheniramine-HYDROcodone  5 mL Oral Q12H  . docusate sodium  200 mg Oral Daily  . enoxaparin  40 mg Subcutaneous Q24H  . furosemide  40 mg Oral Daily  . guaiFENesin  1,200 mg Oral BID  . insulin aspart  0-15 Units Subcutaneous TID WC  . metoprolol tartrate  25 mg Oral BID   Or  . metoprolol tartrate  25 mg Per Tube BID  . pantoprazole  40 mg Oral Q1200  . potassium chloride  20 mEq Oral BID  . ramipril  2.5 mg Oral Daily  . sodium chloride  3 mL Intravenous Q12H     Radiology/Studies:  No results found.  INR: Will add last result for INR, ABG once components are confirmed Will add last 4 CBG results once components are confirmed  Assessment/Plan: S/P Procedure(s) (LRB): CORONARY ARTERY BYPASS GRAFTING (CABG) (N/A)  1. Cont IV amio  2 gentle diuresis 3 pulm toilet 4 rehab  LOS: 11 days    GOLD,WAYNE E 4/30/20137:38 AM    Patient seen and examined. Agree with above. Will continue IV amio today, convert to Po tomorrow No BM- miralax, fleet PRN

## 2011-11-06 ENCOUNTER — Inpatient Hospital Stay (HOSPITAL_COMMUNITY): Payer: Medicare Other

## 2011-11-06 ENCOUNTER — Encounter (HOSPITAL_COMMUNITY): Payer: Self-pay | Admitting: Anesthesiology

## 2011-11-06 ENCOUNTER — Inpatient Hospital Stay (HOSPITAL_COMMUNITY): Payer: Medicare Other | Admitting: Anesthesiology

## 2011-11-06 ENCOUNTER — Encounter (HOSPITAL_COMMUNITY)
Admission: EM | Disposition: A | Discharge status: Home / Self Care | Payer: Self-pay | Source: Ambulatory Visit | Attending: Thoracic Surgery (Cardiothoracic Vascular Surgery)

## 2011-11-06 DIAGNOSIS — T8131XA Disruption of external operation (surgical) wound, not elsewhere classified, initial encounter: Secondary | ICD-10-CM

## 2011-11-06 HISTORY — PX: STERNAL INCISION RECLOSURE: SHX2442

## 2011-11-06 LAB — GLUCOSE, CAPILLARY
Glucose-Capillary: 127 mg/dL — ABNORMAL HIGH (ref 70–99)
Glucose-Capillary: 118 mg/dL — ABNORMAL HIGH (ref 70–99)

## 2011-11-06 SURGERY — REWIRING, STERNUM
Anesthesia: General | Site: Chest | Wound class: Clean

## 2011-11-06 MED ORDER — PROPOFOL 10 MG/ML IV EMUL
INTRAVENOUS | Status: DC | PRN
  Administered 2011-11-06: 60 mg via INTRAVENOUS

## 2011-11-06 MED ORDER — BENZONATATE 100 MG PO CAPS
200.0000 mg | ORAL_CAPSULE | Freq: Three times a day (TID) | ORAL | Status: DC
  Administered 2011-11-06 – 2011-11-10 (×11): 200 mg via ORAL
  Filled 2011-11-06 (×13): qty 2

## 2011-11-06 MED ORDER — BENZONATATE 100 MG PO CAPS
100.0000 mg | ORAL_CAPSULE | Freq: Three times a day (TID) | ORAL | Status: DC
  Administered 2011-11-06: 100 mg via ORAL
  Filled 2011-11-06 (×2): qty 1

## 2011-11-06 MED ORDER — MIDAZOLAM HCL 5 MG/5ML IJ SOLN
INTRAMUSCULAR | Status: DC | PRN
  Administered 2011-11-06: 1 mg via INTRAVENOUS

## 2011-11-06 MED ORDER — GLYCOPYRROLATE 0.2 MG/ML IJ SOLN
INTRAMUSCULAR | Status: DC | PRN
  Administered 2011-11-06: .6 mg via INTRAVENOUS

## 2011-11-06 MED ORDER — VANCOMYCIN HCL 1000 MG IV SOLR
1000.0000 mg | INTRAVENOUS | Status: DC | PRN
  Administered 2011-11-06: 1000 mg via INTRAVENOUS

## 2011-11-06 MED ORDER — FLUTICASONE PROPIONATE HFA 44 MCG/ACT IN AERO
2.0000 | INHALATION_SPRAY | Freq: Two times a day (BID) | RESPIRATORY_TRACT | Status: DC
  Administered 2011-11-07 – 2011-11-10 (×6): 2 via RESPIRATORY_TRACT
  Filled 2011-11-06 (×2): qty 10.6

## 2011-11-06 MED ORDER — FENTANYL CITRATE 0.05 MG/ML IJ SOLN
INTRAMUSCULAR | Status: DC | PRN
  Administered 2011-11-06: 50 ug via INTRAVENOUS
  Administered 2011-11-06: 100 ug via INTRAVENOUS
  Administered 2011-11-06 (×4): 50 ug via INTRAVENOUS

## 2011-11-06 MED ORDER — MOXIFLOXACIN HCL IN NACL 400 MG/250ML IV SOLN
400.0000 mg | INTRAVENOUS | Status: AC
  Administered 2011-11-06: 400 mg via INTRAVENOUS
  Filled 2011-11-06 (×2): qty 250

## 2011-11-06 MED ORDER — CHLORHEXIDINE GLUCONATE 4 % EX LIQD
60.0000 mL | Freq: Once | CUTANEOUS | Status: AC
  Administered 2011-11-06: 4 via TOPICAL
  Filled 2011-11-06: qty 60

## 2011-11-06 MED ORDER — LACTATED RINGERS IV SOLN
INTRAVENOUS | Status: DC | PRN
  Administered 2011-11-06: 08:00:00 via INTRAVENOUS

## 2011-11-06 MED ORDER — ONDANSETRON HCL 4 MG/2ML IJ SOLN
INTRAMUSCULAR | Status: DC | PRN
  Administered 2011-11-06: 4 mg via INTRAVENOUS

## 2011-11-06 MED ORDER — MORPHINE SULFATE 2 MG/ML IJ SOLN: 0.0500 mg/kg | INTRAMUSCULAR | Status: DC | PRN

## 2011-11-06 MED ORDER — MOXIFLOXACIN HCL 400 MG PO TABS
400.0000 mg | ORAL_TABLET | Freq: Every day | ORAL | Status: DC
  Administered 2011-11-07 – 2011-11-09 (×3): 400 mg via ORAL
  Filled 2011-11-06 (×4): qty 1

## 2011-11-06 MED ORDER — NEOSTIGMINE METHYLSULFATE 1 MG/ML IJ SOLN
INTRAMUSCULAR | Status: DC | PRN
  Administered 2011-11-06: 4 mg via INTRAVENOUS

## 2011-11-06 MED ORDER — LEVALBUTEROL HCL 0.63 MG/3ML IN NEBU
0.6300 mg | INHALATION_SOLUTION | Freq: Four times a day (QID) | RESPIRATORY_TRACT | Status: DC
  Administered 2011-11-06 – 2011-11-07 (×4): 0.63 mg via RESPIRATORY_TRACT
  Filled 2011-11-06 (×6): qty 3

## 2011-11-06 MED ORDER — HYDROMORPHONE HCL PF 1 MG/ML IJ SOLN: 0.2500 mg | INTRAMUSCULAR | Status: DC | PRN

## 2011-11-06 MED ORDER — LIDOCAINE HCL (CARDIAC) 20 MG/ML IV SOLN
INTRAVENOUS | Status: DC | PRN
  Administered 2011-11-06: 100 mg via INTRAVENOUS

## 2011-11-06 MED ORDER — FUROSEMIDE 40 MG PO TABS
40.0000 mg | ORAL_TABLET | Freq: Every day | ORAL | Status: DC
  Administered 2011-11-07: 40 mg via ORAL
  Filled 2011-11-06: qty 1

## 2011-11-06 MED ORDER — ONDANSETRON HCL 4 MG/2ML IJ SOLN: 4.0000 mg | Freq: Once | INTRAMUSCULAR | Status: DC | PRN

## 2011-11-06 MED ORDER — ROCURONIUM BROMIDE 100 MG/10ML IV SOLN
INTRAVENOUS | Status: DC | PRN
  Administered 2011-11-06: 50 mg via INTRAVENOUS

## 2011-11-06 MED ORDER — 0.9 % SODIUM CHLORIDE (POUR BTL) OPTIME
TOPICAL | Status: DC | PRN
  Administered 2011-11-06: 1000 mL

## 2011-11-06 SURGICAL SUPPLY — 48 items
ATTRACTOMAT 16X20 MAGNETIC DRP (DRAPES) ×2 IMPLANT
BAG DECANTER FOR FLEXI CONT (MISCELLANEOUS) IMPLANT
BLADE SURG 10 STRL SS (BLADE) ×4 IMPLANT
CANISTER SUCTION 2500CC (MISCELLANEOUS) ×2 IMPLANT
CLOTH BEACON ORANGE TIMEOUT ST (SAFETY) ×2 IMPLANT
CONT SPEC 4OZ CLIKSEAL STRL BL (MISCELLANEOUS) IMPLANT
DRAIN CHANNEL 19F RND (DRAIN) ×4 IMPLANT
DRAPE LAPAROSCOPIC ABDOMINAL (DRAPES) ×2 IMPLANT
DRAPE SLUSH/WARMER DISC (DRAPES) IMPLANT
DRSG COVADERM 4X14 (GAUZE/BANDAGES/DRESSINGS) ×4 IMPLANT
ELECT REM PT RETURN 9FT ADLT (ELECTROSURGICAL) ×2
ELECTRODE REM PT RTRN 9FT ADLT (ELECTROSURGICAL) ×1 IMPLANT
EVACUATOR SILICONE 100CC (DRAIN) ×4 IMPLANT
GLOVE BIO SURGEON STRL SZ 6.5 (GLOVE) ×2 IMPLANT
GLOVE BIOGEL PI IND STRL 6 (GLOVE) ×1 IMPLANT
GLOVE BIOGEL PI IND STRL 6.5 (GLOVE) ×1 IMPLANT
GLOVE BIOGEL PI INDICATOR 6 (GLOVE) ×1
GLOVE BIOGEL PI INDICATOR 6.5 (GLOVE) ×1
GLOVE EUDERMIC 7 POWDERFREE (GLOVE) ×4 IMPLANT
GOWN STRL NON-REIN LRG LVL3 (GOWN DISPOSABLE) ×4 IMPLANT
GOWN STRL REIN XL XLG (GOWN DISPOSABLE) ×2 IMPLANT
HANDPIECE INTERPULSE COAX TIP (DISPOSABLE)
HEMOSTAT POWDER SURGIFOAM 1G (HEMOSTASIS) IMPLANT
KIT BASIN OR (CUSTOM PROCEDURE TRAY) ×2 IMPLANT
KIT ROOM TURNOVER OR (KITS) ×2 IMPLANT
NS IRRIG 1000ML POUR BTL (IV SOLUTION) ×2 IMPLANT
PACK CHEST (CUSTOM PROCEDURE TRAY) ×2 IMPLANT
PAD ARMBOARD 7.5X6 YLW CONV (MISCELLANEOUS) ×2 IMPLANT
SET HNDPC FAN SPRY TIP SCT (DISPOSABLE) IMPLANT
SOLUTION BETADINE 4OZ (MISCELLANEOUS) IMPLANT
SPONGE GAUZE 4X4 12PLY (GAUZE/BANDAGES/DRESSINGS) ×2 IMPLANT
SPONGE LAP 18X18 X RAY DECT (DISPOSABLE) IMPLANT
SUT SILK  1 MH (SUTURE) ×2
SUT SILK 1 MH (SUTURE) ×2 IMPLANT
SUT STEEL 6MS V (SUTURE) ×2 IMPLANT
SUT STEEL STERNAL CCS#1 18IN (SUTURE) IMPLANT
SUT STEEL SZ 6 DBL 3X14 BALL (SUTURE) ×6 IMPLANT
SUT VIC AB 1 CTX 36 (SUTURE) ×2
SUT VIC AB 1 CTX36XBRD ANBCTR (SUTURE) ×2 IMPLANT
SUT VIC AB 2-0 CTX 27 (SUTURE) ×4 IMPLANT
SUT VIC AB 3-0 X1 27 (SUTURE) ×4 IMPLANT
SWAB COLLECTION DEVICE MRSA (MISCELLANEOUS) IMPLANT
TAPE CLOTH SURG 4X10 WHT LF (GAUZE/BANDAGES/DRESSINGS) ×2 IMPLANT
TOWEL OR 17X24 6PK STRL BLUE (TOWEL DISPOSABLE) ×2 IMPLANT
TOWEL OR 17X26 10 PK STRL BLUE (TOWEL DISPOSABLE) ×2 IMPLANT
TRAY FOLEY IC TEMP SENS 14FR (CATHETERS) IMPLANT
TUBE ANAEROBIC SPECIMEN COL (MISCELLANEOUS) IMPLANT
WATER STERILE IRR 1000ML POUR (IV SOLUTION) ×2 IMPLANT

## 2011-11-06 NOTE — Interval H&P Note (Signed)
History and Physical Interval Note:  11/06/2011 8:19 AM  Jesus Maldonado  has presented today for surgery, with the diagnosis of S/P CABG, STERNAL WIRE EXPOSURE  The various methods of treatment have been discussed with the patient and family. After consideration of risks, benefits and other options for treatment, the patient has consented to  Procedure(s) (LRB): STERNAL REWIRING (N/A) as a surgical intervention .  The patients' history has been reviewed, patient examined, no change in status, stable for surgery.  I have reviewed the patients' chart and labs.  Questions were answered to the patient's satisfaction.     [name]  In interim patient has undergone coronary artery bypass grafting. Complicated by sternal wire(s) pulling through lower end of sternum. Resulting in pain and movement of the sternum in the lower portion of the sternotomy.  Needs sternal rewiring. Pt aware of risks, benefits.

## 2011-11-06 NOTE — Preoperative (Signed)
Beta Blockers   Reason not to administer Beta Blockers:Not Applicable 

## 2011-11-06 NOTE — Progress Notes (Signed)
   CARE MANAGEMENT NOTE 11/06/2011  Patient:  Jesus Maldonado, Jesus Maldonado   Account Number:  [id number]  Date Initiated:  11/01/2011  Documentation initiated by:  GRAVES-BIGELOW,BRENDA  Subjective/Objective Assessment:   Pt admitted with cp. Has family support. S/p LHC-/22/13 to have triple vessel CAD with totally occluded RCA and LAD and high grade disease in ostium of OM1. Plan CABG 4-26.     Action/Plan:   PTA, PT INDEPENDENT, LIVES ALONE.  HE STATES HE WILL STAY WITH HIS DAUGHTER AT [place].  HE REQUESTS RW FOR HOME.   Anticipated DC Date:  11/07/2011   Anticipated DC Plan:  HOME W HOME HEALTH SERVICES      DC Planning Services  CM consult      Choice offered to / List presented to:     DME arranged  [facility]      DME agency  [facility]        Status of service:  In process, will continue to follow Medicare Important Message given?   (If response is "NO", the following Medicare IM given date fields will be blank) Date Medicare IM given:   Date Additional Medicare IM given:    Discharge Disposition:    Per UR Regulation:    If discussed at Long Length of Stay Meetings, dates discussed:    Comments:  11/06/11 Viraaj Vorndran,RN,BSN PT REALIZED HE HAS RW AT HOME, REQUESTS 3 IN 1 INSTEAD. NOTIFIED [facility] OF DME NEEDS. Phone #[phone]   11/04/11 Makyra Corprew,RN,BSN 1400 REFERRAL TO [facility] FOR DME NEEDS.  11-01-11 [phone] Tomi Bamberger, RN,BSN [phone] CM will continue to monitor for d/c disposition.

## 2011-11-06 NOTE — Anesthesia Preprocedure Evaluation (Signed)
Anesthesia Evaluation  Patient identified by MRN, date of birth, ID band Patient awake    Reviewed: Allergy & Precautions, H&P , NPO status , Patient's Chart, lab work & pertinent test results  Airway Mallampati: II TM Distance: >3 FB Neck ROM: Full  Mouth opening: Limited Mouth Opening  Dental  (+) Poor Dentition and Dental Advisory Given   Pulmonary  breath sounds clear to auscultation        Cardiovascular Rhythm:Regular Rate:Normal     Neuro/Psych    GI/Hepatic   Endo/Other    Renal/GU      Musculoskeletal   Abdominal   Peds  Hematology   Anesthesia Other Findings   Reproductive/Obstetrics                           Anesthesia Physical Anesthesia Plan  ASA: IV  Anesthesia Plan: General   Post-op Pain Management:    Induction: Intravenous  Airway Management Planned: Oral ETT  Additional Equipment:   Intra-op Plan:   Post-operative Plan: Extubation in OR  Informed Consent: I have reviewed the patients History and Physical, chart, labs and discussed the procedure including the risks, benefits and alternatives for the proposed anesthesia with the patient or authorized representative who has indicated his/her understanding and acceptance.   Dental advisory given  Plan Discussed with: CRNA, Anesthesiologist and Surgeon  Anesthesia Plan Comments:         Anesthesia Quick Evaluation

## 2011-11-06 NOTE — Anesthesia Postprocedure Evaluation (Signed)
  Anesthesia Post-op Note  Patient: Jesus Maldonado  Procedure(s) Performed: Procedure(s) (LRB): STERNAL REWIRING (N/A)  Patient Location: PACU  Anesthesia Type: General  Level of Consciousness: awake and sedated  Airway and Oxygen Therapy: Patient Spontanous Breathing and Patient connected to nasal cannula oxygen  Post-op Pain: none  Post-op Assessment: Post-op Vital signs reviewed, Patient's Cardiovascular Status Stable, Respiratory Function Stable, Patent Airway, No signs of Nausea or vomiting and Pain level controlled  Post-op Vital Signs: Reviewed and stable  Complications: No apparent anesthesia complications

## 2011-11-06 NOTE — Progress Notes (Signed)
Patient felt to weak and tired to sign his permit, and requested that his daughter Jesus Maldonado sign permits for him.Jesus Maldonado is P.O.A. For her Dad. Permit was read to patient by Jesus Maldonado and I was present for the signing of permit. Patient in agreement with surgery.

## 2011-11-06 NOTE — Brief Op Note (Signed)
10/25/2011 - 11/06/2011  10:26 AM  PATIENT:  Jesus Maldonado  78 y.o. 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   Delay start of Pharmacological VTE agent (>24hrs) due to surgical blood loss or risk of bleeding: yes

## 2011-11-06 NOTE — Transfer of Care (Signed)
Immediate Anesthesia Transfer of Care Note  Patient: Jesus Maldonado  Procedure(s) Performed: Procedure(s) (LRB): STERNAL REWIRING (N/A)  Patient Location: PACU  Anesthesia Type: General  Level of Consciousness: awake, alert  and oriented  Airway & Oxygen Therapy: Patient Spontanous Breathing and Patient connected to face mask oxygen  Post-op Assessment: Report given to PACU RN and Post -op Vital signs reviewed and stable  Post vital signs: Reviewed and stable  Complications: No apparent anesthesia complications

## 2011-11-07 ENCOUNTER — Encounter (HOSPITAL_COMMUNITY): Payer: Self-pay | Admitting: Thoracic Surgery (Cardiothoracic Vascular Surgery)

## 2011-11-07 LAB — GLUCOSE, CAPILLARY
Glucose-Capillary: 128 mg/dL — ABNORMAL HIGH (ref 70–99)
Glucose-Capillary: 121 mg/dL — ABNORMAL HIGH (ref 70–99)
Glucose-Capillary: 127 mg/dL — ABNORMAL HIGH (ref 70–99)

## 2011-11-07 MED ORDER — FUROSEMIDE 40 MG PO TABS
40.0000 mg | ORAL_TABLET | Freq: Two times a day (BID) | ORAL | Status: DC
  Administered 2011-11-07 – 2011-11-10 (×6): 40 mg via ORAL
  Filled 2011-11-07 (×8): qty 1

## 2011-11-07 MED ORDER — LEVALBUTEROL HCL 0.63 MG/3ML IN NEBU
0.6300 mg | INHALATION_SOLUTION | RESPIRATORY_TRACT | Status: DC | PRN
  Administered 2011-11-08: 0.63 mg via RESPIRATORY_TRACT
  Filled 2011-11-07: qty 3

## 2011-11-07 MED ORDER — LEVALBUTEROL HCL 0.63 MG/3ML IN NEBU
0.6300 mg | INHALATION_SOLUTION | Freq: Three times a day (TID) | RESPIRATORY_TRACT | Status: DC
  Administered 2011-11-07 – 2011-11-10 (×8): 0.63 mg via RESPIRATORY_TRACT
  Filled 2011-11-07 (×11): qty 3

## 2011-11-07 MED ORDER — AMIODARONE HCL IN DEXTROSE 360-4.14 MG/200ML-% IV SOLN: 30.0000 mg/h | INTRAVENOUS | Status: AC

## 2011-11-07 MED ORDER — INSULIN ASPART 100 UNIT/ML ~~LOC~~ SOLN
0.0000 [IU] | Freq: Three times a day (TID) | SUBCUTANEOUS | Status: DC
  Administered 2011-11-09: 2 [IU] via SUBCUTANEOUS

## 2011-11-07 MED ORDER — AMIODARONE HCL 200 MG PO TABS
400.0000 mg | ORAL_TABLET | Freq: Two times a day (BID) | ORAL | Status: DC
  Administered 2011-11-07 – 2011-11-10 (×7): 400 mg via ORAL
  Filled 2011-11-07 (×8): qty 2

## 2011-11-07 MED FILL — Hydromorphone HCl Inj 1 MG/ML: INTRAMUSCULAR | Qty: 1 | Status: AC

## 2011-11-07 NOTE — Progress Notes (Signed)
CARDIAC REHAB PHASE I   PRE:  Rate/Rhythm: 91SR  BP:  Supine: 110/66  Sitting:   Standing:    SaO2: 98%3L  MODE:  Ambulation: 460 ft   POST:  Rate/Rhythem: 104  BP:  Supine:   Sitting: 140/76  Standing:    SaO2: 96-99%2L 1030-1102 Pt walked 460 ft on 2L with rolling walker and asst x 2. Gait fairly steady. Stopped a couple of times to rest. Left on 2L due to sats good. To recliner with call bell. Tolerated well.  Duanne Limerick

## 2011-11-07 NOTE — Progress Notes (Signed)
Pt ambulated 552ft with walker on 3LNC and 1 assist. Gait fairly steady. Pt tolerated well. No complaints. Pt returned to bed. Bed alarm on. Place on 3L. Will continue to monitor

## 2011-11-07 NOTE — Progress Notes (Addendum)
[address]            [place] [address]          [phone]     1 Day Post-Op  Procedure(s) (LRB): STERNAL REWIRING (N/A) Subjective: No pain currently  Objective  Telemetry Afib/SR  Temp:  [97 F (36.1 C)-97.7 F (36.5 C)] 97.3 F (36.3 C) (05/02 0517) Pulse Rate:  [93-111] 93  (05/02 0517) Resp:  [18-24] 24  (05/02 0517) BP: (122-158)/(66-78) 122/75 mmHg (05/02 0517) SpO2:  [96 %-99 %] 99 % (05/02 0517) FiO2 (%):  [2 %] 2 % (05/01 1130) Weight:  [244 lb 4.3 oz (110.8 kg)] 244 lb 4.3 oz (110.8 kg) (05/02 0517)   Intake/Output Summary (Last 24 hours) at 11/07/11 0758 Last data filed at 11/07/11 0538  Gross per 24 hour  Intake 2203.78 ml  Output   1895 ml  Net 308.78 ml       General appearance: alert, cooperative, fatigued and no distress Heart: regular rate and rhythm and S1, S2 normal Lungs: exp wheeze/ronchi in upper airways, clears with cough Abdomen: mod distension, nontender, + BS Extremities: mild edema Wound: incisions healing well  Lab Results:  Basename 11/05/11 1808  NA 133*  K 4.2  CL 98  CO2 24  GLUCOSE 113*  BUN 16  CREATININE 1.00  CALCIUM 9.0  MG --  PHOS --    Basename 11/05/11 1808  AST 55*  ALT 46  ALKPHOS 213*  BILITOT 0.7  PROT 6.5  ALBUMIN 2.7*   No results found for this basename: LIPASE:2,AMYLASE:2 in the last 72 hours  Basename 11/05/11 1808  WBC 11.5*  NEUTROABS --  HGB 12.0*  HCT 36.3*  MCV 89.4  PLT 300   No results found for this basename: CKTOTAL:4,CKMB:4,TROPONINI:4 in the last 72 hours No components found with this basename: POCBNP:3 No results found for this basename: DDIMER in the last 72 hours No results found for this basename: HGBA1C in the last 72 hours No results found for this basename: CHOL,HDL,LDLCALC,TRIG,CHOLHDL in the last 72 hours No results found for this basename: TSH,T4TOTAL,FREET3,T3FREE,THYROIDAB in the last 72 hours No results found for this  basename: VITAMINB12,FOLATE,FERRITIN,TIBC,IRON,RETICCTPCT in the last 72 hours  Medications: Scheduled    . acetaminophen  1,000 mg Oral Q6H   Or  . acetaminophen (TYLENOL) oral liquid 160 mg/5 mL  975 mg Per Tube Q6H  . aspirin EC  325 mg Oral Daily   Or  . aspirin  324 mg Per Tube Daily  . atorvastatin  40 mg Oral q1800  . benzonatate  200 mg Oral TID  . bisacodyl  10 mg Oral Daily   Or  . bisacodyl  10 mg Rectal Daily  . chlorpheniramine-HYDROcodone  5 mL Oral Q12H  . docusate sodium  200 mg Oral Daily  . enoxaparin  40 mg Subcutaneous Q24H  . fluticasone  2 puff Inhalation BID  . furosemide  40 mg Oral Daily  . insulin aspart  0-15 Units Subcutaneous TID WC  . levalbuterol  0.63 mg Nebulization Q6H  . metoprolol tartrate  50 mg Oral BID   Or  . metoprolol tartrate  50 mg Per Tube BID  . moxifloxacin  400 mg Intravenous Q24H  . moxifloxacin  400 mg Oral q1800  . pantoprazole  40 mg Oral Q1200  . potassium chloride  20 mEq Oral BID  .  sodium chloride  3 mL Intravenous Q12H  . DISCONTD: benzonatate  100 mg Oral TID  . DISCONTD: furosemide  40 mg Oral Daily  . DISCONTD: vancomycin  1,500 mg Intravenous 60 min Pre-Op     Radiology/Studies:  Dg Chest 2 View  11/05/2011  *RADIOLOGY REPORT*  Clinical Data: 78 year old male with sternal wire destruction.  CHEST - 2 VIEW  Comparison: 11/03/2011 and earlier.  Findings: Upright AP and lateral views of the chest.  Sequelae median sternotomy.  Sternal wires appears stable and intact; this is the patient's first lateral view the chest following sternotomy.  Sequelae of CABG.  Right IJ introducer sheath removed.  No pneumothorax.  Lower lung volumes.  Bilateral pleural effusions and bibasilar atelectasis.  Stable cardiac size and mediastinal contours.  IMPRESSION: 1.  Sternotomy wires appears stable and intact. 2.  Bilateral pleural effusions.  Bibasilar atelectasis.  Original Report Authenticated By: Harley Hallmark, M.D.   Dg Chest Port  1 View  11/06/2011  *RADIOLOGY REPORT*  Clinical Data: Postop sternal wiring, jugular line placement  PORTABLE CHEST - 1 VIEW  Comparison: Portable exam 1140 hours compared to 11/05/2011  Findings: Right jugular line, tip projecting over SVC. Enlargement of cardiac silhouette with pulmonary vascular congestion. Postsurgical changes of CABG. New sternal wires are present at the mid to inferior sternum. Bibasilar atelectasis. No pneumothorax. No acute osseous findings. Left thoracostomy tube is present.  IMPRESSION: Bibasilar atelectasis. No pneumothorax following right jugular line and left thoracostomy tube placements.  Original Report Authenticated By: Lollie Marrow, M.D.    INR: Will add last result for INR, ABG once components are confirmed Will add last 4 CBG results once components are confirmed  Assessment/Plan: S/P Procedure(s) (LRB): STERNAL REWIRING (N/A)  1Transition to po amiodarone 2. pulm toilet/rx 3 diuresis 4 avelox 5 rehab   LOS: 13 days    GOLD,WAYNE E 5/2/20137:58 AM    Still coughing- has improved a little He's on tussionex and tessalon, inhaled steroids and bronchodilators and antibiotics for presumed bronchitis

## 2011-11-07 NOTE — Progress Notes (Signed)
UR Completed.  Jesus Maldonado [phone] 11/07/2011  

## 2011-11-08 ENCOUNTER — Inpatient Hospital Stay (HOSPITAL_COMMUNITY): Payer: Medicare Other

## 2011-11-08 LAB — BASIC METABOLIC PANEL
CO2: 28 mEq/L (ref 19–32)
Chloride: 96 mEq/L (ref 96–112)
GFR calc non Af Amer: 56 mL/min — ABNORMAL LOW (ref >90)
Glucose, Bld: 94 mg/dL (ref 70–99)
Potassium: 4.9 mEq/L (ref 3.5–5.1)
Sodium: 131 mEq/L — ABNORMAL LOW (ref 135–145)

## 2011-11-08 LAB — CBC
HCT: 34.9 % — ABNORMAL LOW (ref 39.0–52.0)
Hemoglobin: 11.4 g/dL — ABNORMAL LOW (ref 13.0–17.0)
MCHC: 32.7 g/dL (ref 30.0–36.0)
RBC: 3.85 MIL/uL — ABNORMAL LOW (ref 4.22–5.81)
WBC: 12.2 10*3/uL — ABNORMAL HIGH (ref 4.0–10.5)

## 2011-11-08 LAB — GLUCOSE, CAPILLARY
Glucose-Capillary: 96 mg/dL (ref 70–99)
Glucose-Capillary: 106 mg/dL — ABNORMAL HIGH (ref 70–99)

## 2011-11-08 MED ORDER — FLUTICASONE PROPIONATE HFA 44 MCG/ACT IN AERO: 2.0000 | INHALATION_SPRAY | Freq: Two times a day (BID) | RESPIRATORY_TRACT | Status: DC

## 2011-11-08 MED ORDER — AMIODARONE HCL 400 MG PO TABS: 400.0000 mg | ORAL_TABLET | Freq: Two times a day (BID) | ORAL | Status: DC

## 2011-11-08 MED ORDER — BENZONATATE 200 MG PO CAPS: 200.0000 mg | ORAL_CAPSULE | Freq: Three times a day (TID) | ORAL | Status: AC

## 2011-11-08 MED ORDER — DIPHENHYDRAMINE HCL 25 MG PO CAPS: 25.0000 mg | ORAL_CAPSULE | Freq: Four times a day (QID) | ORAL | Status: DC | PRN

## 2011-11-08 MED ORDER — METOPROLOL TARTRATE 50 MG PO TABS: 50.0000 mg | ORAL_TABLET | Freq: Two times a day (BID) | ORAL | Status: DC

## 2011-11-08 MED ORDER — POTASSIUM CHLORIDE CRYS ER 20 MEQ PO TBCR
20.0000 meq | EXTENDED_RELEASE_TABLET | Freq: Two times a day (BID) | ORAL | Status: DC
  Administered 2011-11-09 – 2011-11-10 (×3): 20 meq via ORAL
  Filled 2011-11-08 (×4): qty 1

## 2011-11-08 NOTE — Progress Notes (Signed)
IJ d/c per MD order. Site WNL. Pt tolerated well. Pressure held for 5 minutes. Pressure dressing applied. Dion Saucier

## 2011-11-08 NOTE — Progress Notes (Addendum)
2 Days Post-Op Procedure(s) (LRB): STERNAL REWIRING (N/A)  Subjective: Patient with complaints of constipation and cough;although cough is somewhat improved.  Objective: Vital signs in last 24 hours: Patient Vitals for the past 24 hrs:  BP Temp Temp src Pulse Resp SpO2 Weight  11/08/11 0405 119/70 mmHg 98.7 F (37.1 C) Oral 83  20  98 % 245 lb 9.5 oz (111.4 kg)  11/07/11 2029 121/72 mmHg 97.6 F (36.4 C) Oral 91  24  95 % -  11/07/11 2015 - - - 88  15  98 % -  11/07/11 1430 117/73 mmHg 97.2 F (36.2 C) Oral 92  20  96 % -  11/07/11 1419 - - - - - 95 % -  11/07/11 0854 - - - - - 98 % -   Pre op weight  104.9 kg Current Weight  11/08/11 245 lb 9.5 oz (111.4 kg)      Intake/Output from previous day: 05/02 0701 - 05/03 0700 In: 843 [P.O.:840; I.V.:3] Out: 450 [Urine:400; Drains:50]   Physical Exam:  Cardiovascular: RRR Pulmonary: Diminished at bases bilaterally; no rales, wheezes, or rhonchi. Abdomen: Soft, non tender, bowel sounds present. Extremities: Bilateral lower extremity edema. Wounds: Sternal dressing is clean and dry.  Lab Results: CBC: Basename 11/08/11 0615 11/05/11 1808  WBC 12.2* 11.5*  HGB 11.4* 12.0*  HCT 34.9* 36.3*  PLT 375 300   BMET:  Basename 11/08/11 0615 11/05/11 1808  NA 131* 133*  K 4.9 4.2  CL 96 98  CO2 28 24  GLUCOSE 94 113*  BUN 18 16  CREATININE 1.20 1.00  CALCIUM 9.0 9.0    PT/INR:  Basename 11/05/11 1808  LABPROT 14.6  INR 1.12   ABG:  INR: Will add last result for INR, ABG once components are confirmed Will add last 4 CBG results once components are confirmed  Assessment/Plan:  1. CV - Had previous runs of SVT, but maintaining SR of late.Continue Amiodarone 400 bid, Lopressor 50 bid. 2.  Pulmonary - Encourage incentive spirometer.CXR this am shows some pulmonary vascular congestion, no ptx.Wean O2 as tolerates. 3. Volume Overload - Continue with Lasix bid. 4.  Acute blood loss anemia - H/H this am 11.4/34.9. 5.WBC  slightly increased from 11.5 to 12.2.Remains afebrile. Is on Avelox and inhaled steroids for presumed bronchitis. 6.Rash on back.Does not appear to be H. Zoster (patient had in the past). Amiodarone rarely causes rash.Possible contact dermatitis.Benadryl PRN itiching. 7.JP had 50 cc of serosanguinous output last 24 hours. Possible remove later today. 8.Remove foley and central line. 9.Warm prune juice for constipation (per patient request).   ZIMMERMAN,DONIELLE MPA-C 11/08/2011   patient seen and examined. Agree with above. He feels better today. Cough has improved. D/c JP  Hopefully home Sunday

## 2011-11-08 NOTE — Discharge Summary (Signed)
Physician Discharge Summary  Patient ID: Jesus Maldonado MRN: [id number] DOB/AGE: 1931/03/31 78 y.o.  Admit date: 10/25/2011 Discharge date: 11/10/2011  Procedure (s):  Sternal rewiring by Dr. Dorris Fetch on 11/06/2011.   Brief Hospital Course (since last dictation): He did have episodes of SVT. He was placed on IV Amiodarone. He then remained in NSR. He was changed to oral Amiodarone. He then developed clicking and instability of the sternum after coughing spells (he is being treated for probable bronchitis).As a result, he required sternal rewiring.He remained afebrile.Provided he remains afebrile,hemodynamically stable, and pending morning round evaluation,he will be discharged on Sunday 11/10/2011.  Latest Vital Signs: Blood pressure 119/70, pulse 83, temperature 98.7 F (37.1 C), temperature source Oral, resp. rate 20, height 5\' 10"  (1.778 m), weight 245 lb 9.5 oz (111.4 kg), SpO2 98.00%.  Physical Exam: Cardiovascular: RRR  Pulmonary: Diminished at bases bilaterally; no rales, wheezes, or rhonchi.  Abdomen: Soft, non tender, bowel sounds present.  Extremities: Bilateral lower extremity edema.  Wounds: Sternal dressing is clean and dry.  Discharge Condition:Stable  Recent laboratory studies:  Lab Results  Component Value Date   WBC 12.2* 11/08/2011   HGB 11.4* 11/08/2011   HCT 34.9* 11/08/2011   MCV 90.6 11/08/2011   PLT 375 11/08/2011   Lab Results  Component Value Date   NA 131* 11/08/2011   K 4.9 11/08/2011   CL 96 11/08/2011   CO2 28 11/08/2011   CREATININE 1.20 11/08/2011   GLUCOSE 94 11/08/2011      Diagnostic Studies: Dg Chest 2 View  11/08/2011  *RADIOLOGY REPORT*  Clinical Data: Sternal rewiring.  PORTABLE CHEST - 1 VIEW  Comparison: 11/06/2011.  Findings: Right central line tip poorly delineated on the present examination secondary to overlapping structures.  Cardiomegaly.  Mediastinum is prominent although unchanged. Central pulmonary vascular congestion has decreased slightly since  prior exam.  IMPRESSION: Slight decrease in degree of pulmonary vascular congestion.  Please see above.  Original    Discharge Orders    Future Appointments: Provider: Department: Dept Phone: Center:   11/26/2011 10:45 AM Loreli Slot, MD [facility] [phone] [facility]      Discharge Medications: Medication List  As of 11/08/2011  9:55 AM   STOP taking these medications         nitroGLYCERIN 0.4 MG SL tablet         TAKE these medications         amiodarone 400 MG tablet   Commonly known as: PACERONE   Take 1 tablet (400 mg total) by mouth 2 (two) times daily. For one week;then take Amiodarone 400 mg po daily thereafter.      aspirin 325 MG tablet   Take 325 mg by mouth daily.      atorvastatin 40 MG tablet   Commonly known as: LIPITOR   Take 40 mg by mouth at bedtime.      benzonatate 200 MG capsule   Commonly known as: TESSALON   Take 1 capsule (200 mg total) by mouth 3 (three) times daily.      cimetidine 800 MG tablet   Commonly known as: TAGAMET   Take 800 mg by mouth 2 (two) times daily.      fluticasone 44 MCG/ACT inhaler   Commonly known as: FLOVENT HFA   Inhale 2 puffs into the lungs 2 (two) times daily.      furosemide 40 MG tablet   Commonly known as: LASIX   Take 1 tablet (40 mg total) by  mouth daily. X 1 week      metoprolol 50 MG tablet   Commonly known as: LOPRESSOR   Take 1 tablet (50 mg total) by mouth 2 (two) times daily.      oxyCODONE 5 MG immediate release tablet   Commonly known as: Oxy IR/ROXICODONE   Take 1-2 tablets (5-10 mg total) by mouth every 3 (three) hours as needed for pain.      potassium chloride SA 20 MEQ tablet   Commonly known as: K-DUR,KLOR-CON   Take 1 tablet (20 mEq total) by mouth daily. X 1 week      ramipril 2.5 MG capsule   Commonly known as: ALTACE   Take 2.5 mg by mouth daily.            Follow Up Appointments: Follow-up Information    Follow up with MCALHANY,CHRISTOPHER, MD. Schedule an  appointment as soon as possible for a visit in 2 weeks.   Contact information:   [facility] [address] [place] [address] [phone]       Follow up with Loreli Slot, MD on 11/26/2011. (Have an x-ray at 9:45, then see MD at 10:45)    Contact information:   [address] [place] [address] [phone]          Signed: Doree Fudge MPA-C 11/08/2011, 9:50 AM

## 2011-11-08 NOTE — Progress Notes (Signed)
CARDIAC REHAB PHASE I   PRE:  Rate/Rhythm: 87 SR  BP:  Supine: 80/50  Sitting: 102/56  Standing:    SaO2: 98 2L 95 RA  MODE    :  Ambulation: 550 ft  POST:  Rate/Rhythem: 91  BP:  Supine:   Sitting: 114/60  Standing:    SaO2: 96 RA 1345-1410 Assisted X 1 and used walker to ambulate. Gait steady with walker. Pt leans forward and he needs reminders to keep upright posture. Pt able to walk 550 feet. BP stable after walk. Back to recliner after walk with call light in reach and family present. Gave pt constant reminders about sternal precautions.    Beatrix Fetters

## 2011-11-08 NOTE — Progress Notes (Signed)
Foley d/c per MD order. Pt tolerated well. Will continue to monitor. Dion Saucier

## 2011-11-08 NOTE — Progress Notes (Signed)
Pt refused to lay on sides or with pillow under buttocks. Educated pt on the importance of turning. Pt stated he is comfortable on his back and can breath better. Will continue to monitor

## 2011-11-09 LAB — CBC
HCT: 35 % — ABNORMAL LOW (ref 39.0–52.0)
MCH: 29.8 pg (ref 26.0–34.0)
MCHC: 34 g/dL (ref 30.0–36.0)
MCV: 87.5 fL (ref 78.0–100.0)
Platelets: 440 10*3/uL — ABNORMAL HIGH (ref 150–400)
RDW: 13.7 % (ref 11.5–15.5)
WBC: 10.4 10*3/uL (ref 4.0–10.5)

## 2011-11-09 LAB — TISSUE CULTURE: Gram Stain: NONE SEEN

## 2011-11-09 LAB — GLUCOSE, CAPILLARY: Glucose-Capillary: 105 mg/dL — ABNORMAL HIGH (ref 70–99)

## 2011-11-09 MED ORDER — ENSURE PUDDING PO PUDG
1.0000 | Freq: Three times a day (TID) | ORAL | Status: DC
  Administered 2011-11-09 – 2011-11-10 (×4): 1 via ORAL

## 2011-11-09 NOTE — Progress Notes (Signed)
JP drain removed per order and all ends intact, The patient tolerated the procedure well. Notified the patient to instructed of he was short of breath, light- headed, or felt palpations to call nurse. Will continue to monitor.

## 2011-11-09 NOTE — Progress Notes (Signed)
Patient amb. In hall with walker tol. Well. 550 feet

## 2011-11-09 NOTE — Op Note (Signed)
NAMELEELYN, Jesus Maldonado NO.:  [id number]  MEDICAL RECORD NO.:  [id number]  LOCATION:  2012                         FACILITY:  [facility]  PHYSICIAN:  Salvatore Decent. Dorris Fetch, M.D.DATE OF BIRTH:  [birth year]  DATE OF PROCEDURE:  11/06/2011 DATE OF DISCHARGE:                              OPERATIVE REPORT   PREOPERATIVE DIAGNOSIS:  Sternal dehiscence.  POSTOPERATIVE DIAGNOSIS:  Sternal dehiscence.  PROCEDURE: Sternal rewiring  SURGEON:  Viviann Spare C. Dorris Fetch, MD  ASSISTANT:  None.  ANESTHESIA:  General.  FINDINGS:  Sternal dehiscence with slight separation in the upper sternum, but wires intact.  At the lower end of the sternum there was a marked separation, with a single wire pulled through on the right hemisternum and 2 adjacent wires pulled through the left hemisternum.  No signs of infection. 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  CLINICAL INDICATION:  Jesus Maldonado is a 78 year old gentleman who had undergone coronary artery bypass grafting on November 01, 2011.  During his postoperative course, he developed intractable coughing, this was severe and prolonged.  The night prior to surgery, he complained of feeling that something popped and he had severe pain at his sternotomy.  On exam, his sternum was grossly unstable and he was advised to undergo sternal rewiring in the following morning.  The indications, risks, benefits, and alternatives were discussed in detail with the patient.  He understood and accepted the risks and agreed to proceed.  OPERATIVE NOTE:  Jesus Maldonado was brought to the preop holding area on Nov 05, 2001.  Anesthesia established intravenous access and placed an arterial blood pressure monitoring line.  He was taken to the operating room, anesthetized, and intubated.  The chest and abdomen were prepped and draped in usual sterile fashion.  The previous sternotomy incision was opened, the sutures were cut and then removed.  After opening the subcutaneous  tissue and the pectoralis fascia, there was obvious sternal dehiscence. The upper portion of the sternum was slightly separated, but the wires and bones were intact at that level.  Lower on the left hemisternum, there were adjacent wires that had pulled through the left hemisternum, resulting in an isolated Michaelfurt of bone.  There was a single pulled through on the right side.  The pre-existing wires were removed.  The bone was gently curetted.  There was no sign of infection, but specimen of the sternal bone marrow was sent for tissue culture. The wound was copiously irrigated with warm saline, although difficulty was felt with the bone could be rewired.  A 19-French Blake drain was brought through a separate stab incision and placed below the sternum. The left hemisternum was re-constructed with a weave extending 1 interspace above and below the isolated portion of the sternum.  This end was reinforced with a double wire that encompassed the entire repair.  There was good bone edge approximation.  The defect in the right hemisternum was reapproximated with a single heavy-gauge double wire.  The 2 halves of the hemisternum were brought together using a combination of double stainless-steel wires and figure-of-eight single stainless-steel wires.  The sternal edges were gradually brought back together.  The wires were tightened and the  sternum was inspected and was stable.  The wound was copiously irrigated with warm saline. The pectoralis fascia, subcutaneous tissue, and skin were closed in standard fashion.  Jackson-Pratt drain was placed to bulb suction.  The patient was taken to the recovery room in good condition.     Salvatore Decent Dorris Fetch, M.D.     SCH/MEDQ  D:  11/08/2011  T:  11/08/2011  Job:  [id number]

## 2011-11-09 NOTE — Progress Notes (Addendum)
                    [address]            [place] Maldon[place] [address]          [phone]     3 Days Post-Op Procedure(s) (LRB): STERNAL REWIRING (N/A)  Subjective: Feels well, still with productive cough, but this is improving.  +BM.  Objective: Vital signs in last 24 hours: Patient Vitals for the past 24 hrs:  BP Temp Temp src Pulse Resp SpO2 Weight  11/09/11 0500 - - - - - - 107.729 kg (237 lb 8 oz)  11/09/11 0441 108/70 mmHg 97.9 F (36.6 C) Oral 83  18  91 % -  11/08/11 2135 - - - - - 90 % -  11/08/11 2112 111/66 mmHg 97.9 F (36.6 C) Oral 87  18  93 % -  11/08/11 1414 106/65 mmHg - - 85  20  94 % -   Current Weight  11/09/11 107.729 kg (237 lb 8 oz)     Intake/Output from previous day: 05/03 0701 - 05/04 0700 In: 360 [P.O.:360] Out: 1670 [Urine:1550; Drains:120]    PHYSICAL EXAM:  Heart: RRR Lungs: clear Wound: clean and dry Extremities: Mild LE edema   Lab Results: CBC: Basename 11/09/11 0700 11/08/11 0615  WBC 10.4 12.2*  HGB 11.9* 11.4*  HCT 35.0* 34.9*  PLT 440* 375   BMET:  Basename 11/08/11 0615  NA 131*  K 4.9  CL 96  CO2 28  GLUCOSE 94  BUN 18  CREATININE 1.20  CALCIUM 9.0    PT/INR: No results found for this basename: LABPROT,INR in the last 72 hours   Assessment/Plan: S/P Procedure(s) (LRB): STERNAL REWIRING (N/A) CV- SR.  Continue current meds. Vol overload- diurese. Pulm- Avelox for bronchitis.  Continue pulm toilet/IS. Home in am if remains stable..   LOS: 15 days    Jesus Maldonado 11/09/2011   sternum stable I have seen and examined Jesus Maldonado and agree with the above assessment  and plan.  Jesus Ovens MD Beeper [phone] Office [phone] 11/09/2011 11:56 AM

## 2011-11-09 NOTE — Progress Notes (Addendum)
CARDIAC REHAB PHASE I   PRE:  Rate/Rhythm90 nsr  BP:  Supine:   Sitting: 116/64  Standing:    SaO2: 92%  MODE:  Ambulation: 550 ft   POST:  Rate/Rhythem: 92 nsr  BP:  Supine:   Sitting: 110/62  Standing:    SaO2: 96%  Pt ambulated in hallway X1 assist with RW.  Steady gait.  Pt education completed. Pt interested in outpatient CRP II at [facility], per Patient Dtr.  Reviewed activity instructions with dtr via telephone.  Understanding verbalized  Dan Europe  [phone]

## 2011-11-10 LAB — GLUCOSE, CAPILLARY
Glucose-Capillary: 109 mg/dL — ABNORMAL HIGH (ref 70–99)
Glucose-Capillary: 115 mg/dL — ABNORMAL HIGH (ref 70–99)

## 2011-11-10 NOTE — Progress Notes (Addendum)
                    [address]            [place] [address]          [phone]     4 Days Post-Op Procedure(s) (LRB): STERNAL REWIRING (N/A)  Subjective: Feels well, no complaints.  Objective: Vital signs in last 24 hours: Patient Vitals for the past 24 hrs:  BP Temp Temp src Pulse Resp SpO2 Weight  11/10/11 0756 - - - - - 95 % -  11/10/11 0404 107/54 mmHg 97.7 F (36.5 C) Oral 86  18  95 % 106.8 kg (235 lb 7.2 oz)  11/09/11 2126 103/61 mmHg 97.8 F (36.6 C) Oral 86  18  97 % -  11/09/11 1404 - - - - - 93 % -  11/09/11 1352 104/64 mmHg 97.4 F (36.3 C) Axillary 84  20  92 % -  11/09/11 1031 110/68 mmHg - - 80  - - -   Current Weight  11/10/11 106.8 kg (235 lb 7.2 oz)     Intake/Output from previous day: 05/04 0701 - 05/05 0700 In: 120 [P.O.:120] Out: 350 [Urine:350]    PHYSICAL EXAM:  Heart: RRR Lungs: clear Wound: clean and dry, sternum stable Extremities: mild LE edema  Lab Results: CBC: Basename 11/09/11 0700 11/08/11 0615  WBC 10.4 12.2*  HGB 11.9* 11.4*  HCT 35.0* 34.9*  PLT 440* 375   BMET:  Basename 11/08/11 0615  NA 131*  K 4.9  CL 96  CO2 28  GLUCOSE 94  BUN 18  CREATININE 1.20  CALCIUM 9.0    PT/INR: No results found for this basename: LABPROT,INR in the last 72 hours   Assessment/Plan: S/P Procedure(s) (LRB): STERNAL REWIRING (N/A) Progressing well. Plan d/c home today.  Instructions reviewed with patient.   LOS: 16 days    [name],[name] 11/10/2011   I have seen and examined Jesus Maldonado and agree with the above assessment  and plan. Going home today Delight Ovens MD Beeper [phone] Office [phone] 11/10/2011 12:32 PM

## 2011-11-11 ENCOUNTER — Encounter: Payer: Self-pay | Admitting: Thoracic Surgery (Cardiothoracic Vascular Surgery)

## 2011-11-21 ENCOUNTER — Other Ambulatory Visit: Payer: Self-pay | Admitting: Thoracic Surgery (Cardiothoracic Vascular Surgery)

## 2011-11-21 DIAGNOSIS — I2510 Atherosclerotic heart disease of native coronary artery without angina pectoris: Secondary | ICD-10-CM

## 2011-11-26 ENCOUNTER — Encounter: Payer: Self-pay | Admitting: Thoracic Surgery (Cardiothoracic Vascular Surgery)

## 2011-11-26 ENCOUNTER — Ambulatory Visit
Admission: RE | Admit: 2011-11-26 | Discharge: 2011-11-26 | Disposition: A | Discharge status: Home / Self Care | Payer: Medicare Other | Source: Ambulatory Visit | Attending: Thoracic Surgery (Cardiothoracic Vascular Surgery) | Admitting: Thoracic Surgery (Cardiothoracic Vascular Surgery)

## 2011-11-26 ENCOUNTER — Ambulatory Visit (INDEPENDENT_AMBULATORY_CARE_PROVIDER_SITE_OTHER): Payer: Self-pay | Admitting: Thoracic Surgery (Cardiothoracic Vascular Surgery)

## 2011-11-26 VITALS — BP 100/62 | HR 64 | Resp 20 | Ht 70.0 in | Wt 220.0 lb

## 2011-11-26 DIAGNOSIS — I2510 Atherosclerotic heart disease of native coronary artery without angina pectoris: Secondary | ICD-10-CM

## 2011-11-26 DIAGNOSIS — T8132XA Disruption of internal operation (surgical) wound, not elsewhere classified, initial encounter: Secondary | ICD-10-CM

## 2011-11-26 DIAGNOSIS — Z951 Presence of aortocoronary bypass graft: Secondary | ICD-10-CM

## 2011-11-26 NOTE — Progress Notes (Signed)
HPI:  Mr. Jesus Maldonado returns for a scheduled postoperative followup visit. He had coronary bypass grafting x 4 on April 26. He had severe coughing in the early postoperative period and had sternal dehiscience. He underwent sternal rewiring on May 1. After that his recovery was relatively unremarkable. He did have some atrial fibrillation and was discharged on amiodarone. He was in sinus rhythm at discharge. He states that he has some good days and some bad days. He has not been taking pain medication because he ran out. He has not been using any Tylenol or Advil for pain either. He wants to increase his activities. He's been having difficulty with sleeping and his appetite is poor he  Past Medical History  Diagnosis Date  . Myocardial infarct   . Hypertension   . Hypercholesteremia   . Arthritis   . Coronary artery disease     s/p PCI 10 yrs ago.      Current Outpatient Prescriptions  Medication Sig Dispense Refill  . amiodarone (PACERONE) 400 MG tablet Take 1 tablet (400 mg total) by mouth 2 (two) times daily. For one week;then take Amiodarone 400 mg po daily thereafter.  60 tablet  1  . aspirin 325 MG tablet Take 325 mg by mouth daily.      Marland Kitchen atorvastatin (LIPITOR) 40 MG tablet Take 40 mg by mouth at bedtime.      . cimetidine (TAGAMET) 800 MG tablet Take 800 mg by mouth 2 (two) times daily.      . fluticasone (FLOVENT HFA) 44 MCG/ACT inhaler Inhale 2 puffs into the lungs 2 (two) times daily.  1 Inhaler  1  . metoprolol tartrate (LOPRESSOR) 50 MG tablet Take 1 tablet (50 mg total) by mouth 2 (two) times daily.  60 tablet  1  . ramipril (ALTACE) 2.5 MG capsule Take 2.5 mg by mouth daily.        Physical Exam BP 100/62  Pulse 64  Resp 20  Ht 5\' 10"  (1.778 m)  Wt 220 lb (99.791 kg)  BMI 31.57 kg/m2  SpO2 97% Gen. 78 year old male in no acute distress Sternal incision clean dry and intact, sternum stable Lungs clear with equal breath sounds bilaterally Cardiac regular rate and rhythm  normal S1 and S2 Extremities trace edema at the ankles, incisions healing well  Diagnostic Tests: Chest x-ray today shows good aeration of the lungs bilaterally, no active disease.  Impression: 78 year old gentleman status post coronary bypass grafting calculated by atrial fibrillation and disruption of the sternum requiring rewiring. Overall I think he's doing well at this point in time, all things considered. I did give him a prescription for oxycodone 5 mg tablets one to 2 tablets 3 times daily as needed for pain, 20 tablets no refills. I encouraged him to use Tylenol or Advil for general soreness and only used pain medication if he really needs it. He still not to lift any objects weight greater than 10 pounds for another 4 weeks. He may begin increasing his physical activity as tolerated.   His daughter asked about and anxiety medication or antidepressant. He cries frequently. She says this predated his surgery. I explained to them that is common to go through a depression in the first several weeks after a major operation.  given that he may have had some depression prior surgery I think it would be best if he discussed possible treatment for that with his primary physician   Plan: Will plan to see him back in about 3 weeks  to check on his progress

## 2011-12-09 ENCOUNTER — Encounter: Payer: Self-pay | Admitting: Cardiovascular Disease

## 2011-12-09 ENCOUNTER — Ambulatory Visit (INDEPENDENT_AMBULATORY_CARE_PROVIDER_SITE_OTHER): Payer: Medicare Other | Admitting: Cardiovascular Disease

## 2011-12-09 VITALS — BP 110/64 | HR 60 | Ht 70.0 in | Wt 223.0 lb

## 2011-12-09 DIAGNOSIS — I10 Essential (primary) hypertension: Secondary | ICD-10-CM

## 2011-12-09 DIAGNOSIS — I2510 Atherosclerotic heart disease of native coronary artery without angina pectoris: Secondary | ICD-10-CM

## 2011-12-09 DIAGNOSIS — I4891 Unspecified atrial fibrillation: Secondary | ICD-10-CM | POA: Insufficient documentation

## 2011-12-09 NOTE — Patient Instructions (Signed)
You are scheduled to see Dr. Clifton James on February 11, 2012.  We will make referral to cardiac rehab for you if you would like.

## 2011-12-09 NOTE — Assessment & Plan Note (Signed)
BP well controlled.

## 2011-12-09 NOTE — Assessment & Plan Note (Addendum)
Post-op atrial fib. Now in sinus rhythm on amiodarone. Will continue until next visit. If he is still in sinus at next visit, will d/c amiodarone.

## 2011-12-09 NOTE — Progress Notes (Signed)
History of Present Illness: [name] with history of CAD s/p CABG 4/13, HTN, HLD with recent admission with NSTEMI at [facility] 10/25/11. Cardiac cath 10/28/11 and found to have a totally occluded LAD and RCA with high grade disease in the obtuse marginal branch of the Circumflex. I attempted PCI of the mid LAD as this appeared that it could be acute but was unable to cross the total occlusion. He underwent 4V CABG 11/01/11 per Dr. Dorris Fetch. ((left internal mammary artery to left anterior descending, saphenous vein graft to posterior descending, saphenous vein graft to first diagonal, saphenous vein graft to obtuse marginal 1). He had to undergo a second surgery Nov 06, 2011 for sternal dehiscence. He did have isolated post-op atrial fibrillation and was started on amiodarone.  He was discharged home on 11/10/11. He has done well since then and has been seen by Dr. Dorris Fetch. His sternum seems to be feeling ok. No chest pain or SOB. He has been tolerating all meds. He is asking when he can return to driving and playing golf.   Primary Care Physician: Dixie Dials  Last Lipid Profile:  Lipid Panel     Component Value Date/Time   CHOL 166 10/26/2011 0205   TRIG 131 10/26/2011 0205   HDL 54 10/26/2011 0205   CHOLHDL 3.1 10/26/2011 0205   VLDL 26 10/26/2011 0205   LDLCALC 86 10/26/2011 0205     Past Medical History  Diagnosis Date  . Myocardial infarct   . Hypertension   . Hypercholesteremia   . Arthritis   . Coronary artery disease     s/p PCI 10 yrs ago.    Past Surgical History  Procedure Date  . Cardiac stents   . Hemorroidectomy   . Coronary artery bypass graft 11/01/2011    Procedure: CORONARY ARTERY BYPASS GRAFTING (CABG);  Surgeon: Loreli Slot, MD;  Location: [facility] OR;  Service: Open Heart Surgery;  Laterality: N/A;  Coronary Artery bypass graft times four utilizing the left internal mammary artery and the right greater saphenous vein harvested endoscopically.  . Sternal  incision reclosure 11/06/2011    Procedure: STERNAL REWIRING;  Surgeon: Loreli Slot, MD;  Location: [facility] OR;  Service: Open Heart Surgery;  Laterality: N/A;    Current Outpatient Prescriptions  Medication Sig Dispense Refill  . amiodarone (PACERONE) 400 MG tablet Take 400 mg by mouth daily.      Marland Kitchen aspirin 325 MG tablet Take 325 mg by mouth daily.      Marland Kitchen atorvastatin (LIPITOR) 40 MG tablet Take 40 mg by mouth at bedtime.      . cimetidine (TAGAMET) 800 MG tablet Take 800 mg by mouth 2 (two) times daily.      Marland Kitchen doxylamine, Sleep, (UNISOM) 25 MG tablet Take 25 mg by mouth at bedtime as needed.      . fluticasone (FLOVENT HFA) 44 MCG/ACT inhaler Inhale 2 puffs into the lungs 2 (two) times daily.  1 Inhaler  1  . metoprolol tartrate (LOPRESSOR) 50 MG tablet Take 1 tablet (50 mg total) by mouth 2 (two) times daily.  60 tablet  1  . ramipril (ALTACE) 2.5 MG capsule Take 2.5 mg by mouth daily.        No Known Allergies  History   Social History  . Marital Status: Widowed    Spouse Name: N/A    Number of Children: N/A  . Years of Education: N/A   Occupational History  . Network engineer  Social History Main Topics  . Smoking status: Former Smoker    Types: Cigarettes    Quit date: 07/09/1971  . Smokeless tobacco: Not on file  . Alcohol Use: No  . Drug Use: No  . Sexually Active: Not on file   Other Topics Concern  . Not on file   Social History Narrative  . No narrative on file    No family history on file.  Review of Systems:  As stated in the HPI and otherwise negative.   BP 110/64  Pulse 60  Ht 5\' 10"  (1.778 m)  Wt 223 lb (101.152 kg)  BMI 32.00 kg/m2  Physical Examination: General: Well developed, well nourished, NAD HEENT: OP clear, mucus membranes moist SKIN: warm, dry. No rashes. Neuro: No focal deficits Musculoskeletal: Muscle strength 5/5 all ext Psychiatric: Mood and affect normal Neck: No JVD, no carotid bruits, no thyromegaly, no  lymphadenopathy. Lungs:Clear bilaterally, no wheezes, rhonci, crackles Cardiovascular: Regular rate and rhythm. No murmurs, gallops or rubs. Abdomen:Soft. Bowel sounds present. Non-tender.  Extremities: No lower extremity edema. Pulses are 2 + in the bilateral DP/PT.  EKG: NSR, rate 60 bpm. Poor R wave progression septal leads  Cardiac cath 10/28/11: Left main: Diffuse calcification with mild diffuse plaque.  Left Anterior Descending Artery: Large caliber vessel that courses to the apex. There is a large caliber septal perforating branch. The proximal vessel has diffuse 40% stenosis. The mid vessel has an aneurysmal segment followed by a total occlusion of the mid LAD. The moderate sized diagonal branch arises just at the occlusion of the mid LAD. The diagonal branch has an ostial 40% stenosis and mild plaque in mid vessel. The mid and distal LAD fills slowly from left to left collaterals.  Circumflex Artery: Large caliber vessel. The first OM branch is moderate sized and has an ostial 90% stenosis. The second OM is moderate sized and has 40% proximal stenosis. The AV groove Circumflex has mild plaque disease but no flow limiting lesions.  Right Coronary Artery: Large dominant vessel with 60% serial lesions in the proximal vessel. The mid vessel has a stented segment with 100% occlusion within the stented segment. The mid and distal vessel fills from left to right collaterals.

## 2011-12-09 NOTE — Assessment & Plan Note (Signed)
Stable post bypass. He is on good medical therapy. Maintaining NSR today. Will continue current meds. I have discussed cardiac rehab but he declines.  I will see him back in 8 weeks.

## 2011-12-17 ENCOUNTER — Encounter: Payer: Medicare Other | Admitting: Thoracic Surgery (Cardiothoracic Vascular Surgery)

## 2011-12-17 ENCOUNTER — Encounter: Payer: Self-pay | Admitting: Thoracic Surgery (Cardiothoracic Vascular Surgery)

## 2011-12-18 ENCOUNTER — Encounter: Payer: Self-pay | Admitting: Thoracic Surgery (Cardiothoracic Vascular Surgery)

## 2011-12-18 ENCOUNTER — Ambulatory Visit (INDEPENDENT_AMBULATORY_CARE_PROVIDER_SITE_OTHER): Payer: Self-pay | Admitting: Thoracic Surgery (Cardiothoracic Vascular Surgery)

## 2011-12-18 VITALS — BP 115/71 | HR 63 | Resp 16 | Ht 70.0 in | Wt 227.0 lb

## 2011-12-18 DIAGNOSIS — Z09 Encounter for follow-up examination after completed treatment for conditions other than malignant neoplasm: Secondary | ICD-10-CM

## 2011-12-18 DIAGNOSIS — I2510 Atherosclerotic heart disease of native coronary artery without angina pectoris: Secondary | ICD-10-CM

## 2011-12-18 MED ORDER — FUROSEMIDE 20 MG PO TABS: 20.0000 mg | ORAL_TABLET | Freq: Every day | ORAL | Status: DC

## 2011-12-18 NOTE — Progress Notes (Signed)
  HPI:  Mr. Jesus Maldonado returns for a scheduled postoperative followup visit. He had coronary bypass grafting on April 26. This was complicated by atrial fibrillation and also sternal dehiscence due to persistent coughing. He had a sternal rewiring done on May 1. After that he did well was discharged home without any further complications. Since his been home he's not had any problems related to the sternum. He's not having clicking or popping. He's not had to take any medication for pain. He is anxious to resume full activities, primarily is interested in playing golf.  Past Medical History  Diagnosis Date  . Myocardial infarct   . Hypertension   . Hypercholesteremia   . Arthritis   . Coronary artery disease     s/p PCI 10 yrs ago.      Current Outpatient Prescriptions  Medication Sig Dispense Refill  . amiodarone (PACERONE) 400 MG tablet Take 400 mg by mouth daily.      Marland Kitchen aspirin 325 MG tablet Take 325 mg by mouth daily.      Marland Kitchen atorvastatin (LIPITOR) 40 MG tablet Take 40 mg by mouth at bedtime.      . cimetidine (TAGAMET) 800 MG tablet Take 800 mg by mouth 2 (two) times daily.      Marland Kitchen doxylamine, Sleep, (UNISOM) 25 MG tablet Take 25 mg by mouth at bedtime as needed.      . metoprolol tartrate (LOPRESSOR) 50 MG tablet Take 1 tablet (50 mg total) by mouth 2 (two) times daily.  60 tablet  1  . ramipril (ALTACE) 2.5 MG capsule Take 2.5 mg by mouth daily.      . furosemide (LASIX) 20 MG tablet Take 1 tablet (20 mg total) by mouth daily.  30 tablet  3    Physical Exam BP 115/71  Pulse 63  Resp 16  Ht 5\' 10"  (1.778 m)  Wt 227 lb (102.967 kg)  BMI 32.57 kg/m2  SpO80 73% 78 year old male in no acute distress Lungs clear with equal breath sounds bilaterally Sternum stable, incision well-healed Cardiac regular rate and rhythm normal S1 and S2 Incisions well-healed, 2+ edema right lower extremity, 1+ edema left lower cavity  Diagnostic Tests: Chest x-ray shows good aeration of the lungs,  there arepostoperative changes from sternotomy rewiring  Impression: 78 year old girl is status post coronary bypass grafting in late April complicated by sternal dehiscence requiring rewiring. He's doing well at this point in time. He has minimal discomfort in the sternum is stable. I did advise him to wait another 2 weeks before lifting any objects greater than 10 pounds or doing any heavy work with his arms. He may begin to chips and putt but should not try to swing a golf club for loose another 2 weeks as well.  He may begin driving.  He does have some peripheral edema, I have given him a prescription for Lasix 20 mg daily, filed electronically with his pharmacy in [place].  Plan: He has a followup of what with Dr. Clifton James in August, which time his amiodarone will be discontinued if he remains in sinus rhythm.  I will be happy to see him back any time in the future if I can be of any further assistance with his care.

## 2012-01-07 ENCOUNTER — Telehealth: Payer: Self-pay | Admitting: Cardiovascular Disease

## 2012-01-07 NOTE — Telephone Encounter (Signed)
Mr Jesus Maldonado's daughter states he has been having dizzy spells the past couple weeks.  On Sunday he cut his finger and was taken to urgent care.  When he got to Urgent care, he was very dizzy and they weren't sure if it was from the injury or something else.  His bp at urgent care was 124/76.  He has the dizziness mostly when getting up from a sitting position but also a couple times while walking up the stairs.  He has been getting dizzy a couple times per day and only lasts a couple minutes.  No pain, no sob, no increased swelling (same as at his last visit).  Pt has an appt with Dr Clifton James at the beginning of August.  He started a diuretic about 3 weeks ago.  Please advise.

## 2012-01-07 NOTE — Telephone Encounter (Signed)
Pt having dizzy spells, last couple weeks, what to do?

## 2012-01-08 NOTE — Telephone Encounter (Signed)
Pat, Can we check on him and maybe set him up to see Clearence Cheek or Thayer Ohm on Friday or next week? Thanks, chris

## 2012-01-08 NOTE — Telephone Encounter (Signed)
Spoke with pt's daughter who reports pt is feeling OK today but reports he has had dizziness as noted below.  I offered pt an appt with Norma Fredrickson, NP next Monday but daughter is asking if OK for pt to see his primary MD as they live an hour or so away from our office and primary MD is closer.  I told her that would be fine and to call us if primary MD felt he needed to be seen here prior to planned appt with Dr. Clifton James in August.

## 2012-02-11 ENCOUNTER — Encounter: Payer: Self-pay | Admitting: Cardiovascular Disease

## 2012-02-11 ENCOUNTER — Ambulatory Visit (INDEPENDENT_AMBULATORY_CARE_PROVIDER_SITE_OTHER): Payer: Medicare Other | Admitting: Cardiovascular Disease

## 2012-02-11 VITALS — BP 107/61 | HR 57 | Ht 70.0 in | Wt 238.0 lb

## 2012-02-11 DIAGNOSIS — I4891 Unspecified atrial fibrillation: Secondary | ICD-10-CM

## 2012-02-11 DIAGNOSIS — R609 Edema, unspecified: Secondary | ICD-10-CM

## 2012-02-11 DIAGNOSIS — R600 Localized edema: Secondary | ICD-10-CM | POA: Insufficient documentation

## 2012-02-11 DIAGNOSIS — I2510 Atherosclerotic heart disease of native coronary artery without angina pectoris: Secondary | ICD-10-CM

## 2012-02-11 MED ORDER — FUROSEMIDE 40 MG PO TABS: 40.0000 mg | ORAL_TABLET | Freq: Every day | ORAL | Status: DC

## 2012-02-11 NOTE — Assessment & Plan Note (Addendum)
Stable s/p bypass.  Will continue beta blocker, statin and ASA.

## 2012-02-11 NOTE — Assessment & Plan Note (Signed)
Will increase Lasix to 40 mg po Qdaily. He is asked to elevate his legs and watch his salt intake.

## 2012-02-11 NOTE — Patient Instructions (Signed)
Your physician wants you to follow-up in: 6 months. You will receive a reminder letter in the mail two months in advance. If you don't receive a letter, please call our office to schedule the follow-up appointment.  Your physician has recommended you make the following change in your medication: Stop amiodarone.  Increase furosemide to 40 mg by mouth daily.

## 2012-02-11 NOTE — Progress Notes (Signed)
History of Present Illness:  [name] with history of CAD s/p CABG 4/13, HTN, HLD with recent admission with NSTEMI at [facility] 10/25/11. Cardiac cath 10/28/11 and found to have a totally occluded LAD and RCA with high grade disease in the obtuse marginal branch of the Circumflex. I attempted PCI of the mid LAD as this appeared that it could be acute but was unable to cross the total occlusion. He underwent 4V CABG 11/01/11 per Dr. Dorris Fetch. ((left internal mammary artery to left anterior descending, saphenous vein graft to posterior descending, saphenous vein graft to first diagonal, saphenous vein graft to obtuse marginal 1). He had to undergo a second surgery Nov 06, 2011 for sternal dehiscence. He did have isolated post-op atrial fibrillation and was started on amiodarone. He was discharged home on 11/10/11. He has done well since then and has been seen by Dr. Dorris Fetch. His sternum is feeling better.   He is here for follow up. No chest pain or SOB. He has been tolerating all meds. No awareness of palpitations, near syncope or syncope. He still has some swelling in his legs even though he has been on Lasix.   Primary Care Physician: Jesus Maldonado   Last Lipid Profile:  Lipid Panel    Component  Value  Date/Time    CHOL  166  10/26/2011 0205    TRIG  131  10/26/2011 0205    HDL  54  10/26/2011 0205    CHOLHDL  3.1  10/26/2011 0205    VLDL  26  10/26/2011 0205    LDLCALC  86  10/26/2011 0205      Primary Care Physician:  Last Lipid Profile:  Past Medical History  Diagnosis Date  . Myocardial infarct   . Hypertension   . Hypercholesteremia   . Arthritis   . Coronary artery disease     s/p PCI 10 yrs ago.    Past Surgical History  Procedure Date  . Cardiac stents   . Hemorroidectomy   . Coronary artery bypass graft 11/01/2011    Procedure: CORONARY ARTERY BYPASS GRAFTING (CABG);  Surgeon: Loreli Slot, MD;  Location: [facility] OR;  Service: Open Heart Surgery;  Laterality: N/A;   Coronary Artery bypass graft times four utilizing the left internal mammary artery and the right greater saphenous vein harvested endoscopically.  . Sternal incision reclosure 11/06/2011    Procedure: STERNAL REWIRING;  Surgeon: Loreli Slot, MD;  Location: [facility] OR;  Service: Open Heart Surgery;  Laterality: N/A;    Current Outpatient Prescriptions  Medication Sig Dispense Refill  . amiodarone (PACERONE) 400 MG tablet Take 400 mg by mouth daily.      Marland Kitchen aspirin 325 MG tablet Take 325 mg by mouth daily.      Marland Kitchen atorvastatin (LIPITOR) 40 MG tablet Take 40 mg by mouth at bedtime.      Marland Kitchen doxylamine, Sleep, (UNISOM) 25 MG tablet Take 25 mg by mouth at bedtime as needed.      . furosemide (LASIX) 20 MG tablet Take 1 tablet (20 mg total) by mouth daily.  30 tablet  3  . metoprolol tartrate (LOPRESSOR) 50 MG tablet Take 1 tablet (50 mg total) by mouth 2 (two) times daily.  60 tablet  1  . ramipril (ALTACE) 2.5 MG capsule Take 2.5 mg by mouth daily.      . cimetidine (TAGAMET) 800 MG tablet Take 800 mg by mouth 2 (two) times daily.  No Known Allergies  History   Social History  . Marital Status: Widowed    Spouse Name: N/A    Number of Children: N/A  . Years of Education: N/A   Occupational History  . Warehouse Supervisor    Social History Main Topics  . Smoking status: Former Smoker    Types: Cigarettes    Quit date: 07/09/1971  . Smokeless tobacco: Not on file  . Alcohol Use: No  . Drug Use: No  . Sexually Active: Not on file   Other Topics Concern  . Not on file   Social History Narrative  . No narrative on file    No family history on file.  Review of Systems:  As stated in the HPI and otherwise negative.   BP 107/61  Pulse 57  Ht 5\' 10"  (1.778 m)  Wt 238 lb (107.956 kg)  BMI 34.15 kg/m2  Physical Examination: General: Well developed, well nourished, NAD HEENT: OP clear, mucus membranes moist SKIN: warm, dry. No rashes. Neuro: No focal  deficits Musculoskeletal: Muscle strength 5/5 all ext Psychiatric: Mood and affect normal Neck: No JVD, no carotid bruits, no thyromegaly, no lymphadenopathy. Lungs:Clear bilaterally, no wheezes, rhonci, crackles Cardiovascular: Regular rate and rhythm. No murmurs, gallops or rubs. Abdomen:Soft. Bowel sounds present. Non-tender.  Extremities: No lower extremity edema. Pulses are 2 + in the bilateral DP/PT.  EKG: Sinus brady, rate 56 bpm.

## 2012-02-11 NOTE — Assessment & Plan Note (Signed)
He is maintaining NSR. Will d/c amiodarone.

## 2012-10-02 ENCOUNTER — Ambulatory Visit (INDEPENDENT_AMBULATORY_CARE_PROVIDER_SITE_OTHER): Payer: Medicare Other | Admitting: Cardiovascular Disease

## 2012-10-02 ENCOUNTER — Encounter: Payer: Self-pay | Admitting: Cardiovascular Disease

## 2012-10-02 VITALS — BP 110/58 | HR 72 | Ht 70.0 in | Wt 247.8 lb

## 2012-10-02 DIAGNOSIS — I4891 Unspecified atrial fibrillation: Secondary | ICD-10-CM

## 2012-10-02 DIAGNOSIS — I25810 Atherosclerosis of coronary artery bypass graft(s) without angina pectoris: Secondary | ICD-10-CM

## 2012-10-02 NOTE — Progress Notes (Signed)
History of Present Illness: 79 yo WM with history of CAD s/p CABG 4/13, HTN, HLD, atrial fibrillation with recent admission with NSTEMI at [facility] 10/25/11. Cardiac cath 10/28/11 and found to have a totally occluded LAD and RCA with high grade disease in the obtuse marginal branch of the Circumflex. I attempted PCI of the mid LAD as this appeared that it could be acute but was unable to cross the total occlusion. He underwent 4V CABG 11/01/11 per Dr. Dorris Fetch. ((left internal mammary artery to left anterior descending, saphenous vein graft to posterior descending, saphenous vein graft to first diagonal, saphenous vein graft to obtuse marginal 1). He had to undergo a second surgery Nov 06, 2011 for sternal dehiscence. He did have isolated post-op atrial fibrillation and was started on amiodarone. He was discharged home on 11/10/11. He has done well since then.   He is here for follow up. He tells me that feel ok. No chest pain or SOB.   Primary Care Physician: Delfina Redwood, MD [facility])  Last Lipid Profile:Lipid Panel     Component Value Date/Time   CHOL 166 10/26/2011 0205   TRIG 131 10/26/2011 0205   HDL 54 10/26/2011 0205   CHOLHDL 3.1 10/26/2011 0205   VLDL 26 10/26/2011 0205   LDLCALC 86 10/26/2011 0205     Past Medical History  Diagnosis Date  . Myocardial infarct   . Hypertension   . Hypercholesteremia   . Arthritis   . Coronary artery disease     s/p PCI 10 yrs ago.    Past Surgical History  Procedure Laterality Date  . Cardiac stents    . Hemorroidectomy    . Coronary artery bypass graft  11/01/2011    Procedure: CORONARY ARTERY BYPASS GRAFTING (CABG);  Surgeon: Loreli Slot, MD;  Location: [facility] OR;  Service: Open Heart Surgery;  Laterality: N/A;  Coronary Artery bypass graft times four utilizing the left internal mammary artery and the right greater saphenous vein harvested endoscopically.  . Sternal incision reclosure  11/06/2011    Procedure:  STERNAL REWIRING;  Surgeon: Loreli Slot, MD;  Location: [facility] OR;  Service: Open Heart Surgery;  Laterality: N/A;    Current Outpatient Prescriptions  Medication Sig Dispense Refill  . aspirin 325 MG tablet Take 325 mg by mouth daily.      Marland Kitchen atorvastatin (LIPITOR) 40 MG tablet Take 40 mg by mouth at bedtime.      Marland Kitchen doxylamine, Sleep, (UNISOM) 25 MG tablet Take 25 mg by mouth at bedtime as needed.      Marland Kitchen escitalopram (LEXAPRO) 10 MG tablet Take 5 mg by mouth at bedtime.      . furosemide (LASIX) 40 MG tablet Take 1 tablet (40 mg total) by mouth daily.  30 tablet  6  . metoprolol tartrate (LOPRESSOR) 50 MG tablet Take 1 tablet (50 mg total) by mouth 2 (two) times daily.  60 tablet  1  . omeprazole (PRILOSEC) 20 MG capsule Take 20 mg by mouth daily.      . ramipril (ALTACE) 2.5 MG capsule Take 2.5 mg by mouth daily.       No current facility-administered medications for this visit.    No Known Allergies  History   Social History  . Marital Status: Widowed    Spouse Name: N/A    Number of Children: N/A  . Years of Education: N/A   Occupational History  . Network engineer    Social History Main  Topics  . Smoking status: Former Smoker    Types: Cigarettes    Quit date: 07/09/1971  . Smokeless tobacco: Not on file  . Alcohol Use: No  . Drug Use: No  . Sexually Active: Not on file   Other Topics Concern  . Not on file   Social History Narrative  . No narrative on file    No family history on file.  Review of Systems:  As stated in the HPI and otherwise negative.   BP 110/58  Pulse 72  Ht 5\' 10"  (1.778 m)  Wt 247 lb 12.8 oz (112.401 kg)  BMI 35.56 kg/m2  SpO2 98%  Physical Examination: General: Well developed, well nourished, NAD HEENT: OP clear, mucus membranes moist SKIN: warm, dry. No rashes. Neuro: No focal deficits Musculoskeletal: Muscle strength 5/5 all ext Psychiatric: Mood and affect normal Neck: No JVD, no carotid bruits, no thyromegaly, no  lymphadenopathy. Lungs:Clear bilaterally, no wheezes, rhonci, crackles Cardiovascular: Regular rate and rhythm. No murmurs, gallops or rubs. Abdomen:Soft. Bowel sounds present. Non-tender.  Extremities: No lower extremity edema. Pulses are 2 + in the bilateral DP/PT.  Assessment and Plan:   1. CAD: Stable s/p bypass.  Will continue beta blocker, statin and ASA.     2. Lower extremity edema: Resolved. Continue Lasix.   3. Atrial fibrillation:  He is maintaining NSR.

## 2012-10-02 NOTE — Patient Instructions (Addendum)
Your physician wants you to follow-up in:  6 months. You will receive a reminder letter in the mail two months in advance. If you don't receive a letter, please call our office to schedule the follow-up appointment.   

## 2012-10-15 ENCOUNTER — Encounter: Payer: Self-pay | Admitting: Cardiovascular Disease

## 2012-12-05 IMAGING — CR DG CHEST 1V PORT
1 series · 1 of 1 positions shown · non-contrast
Comparison: 11/06/2011.

CLINICAL DATA: Sternal rewiring.

PORTABLE CHEST - 1 VIEW

[view not recorded]
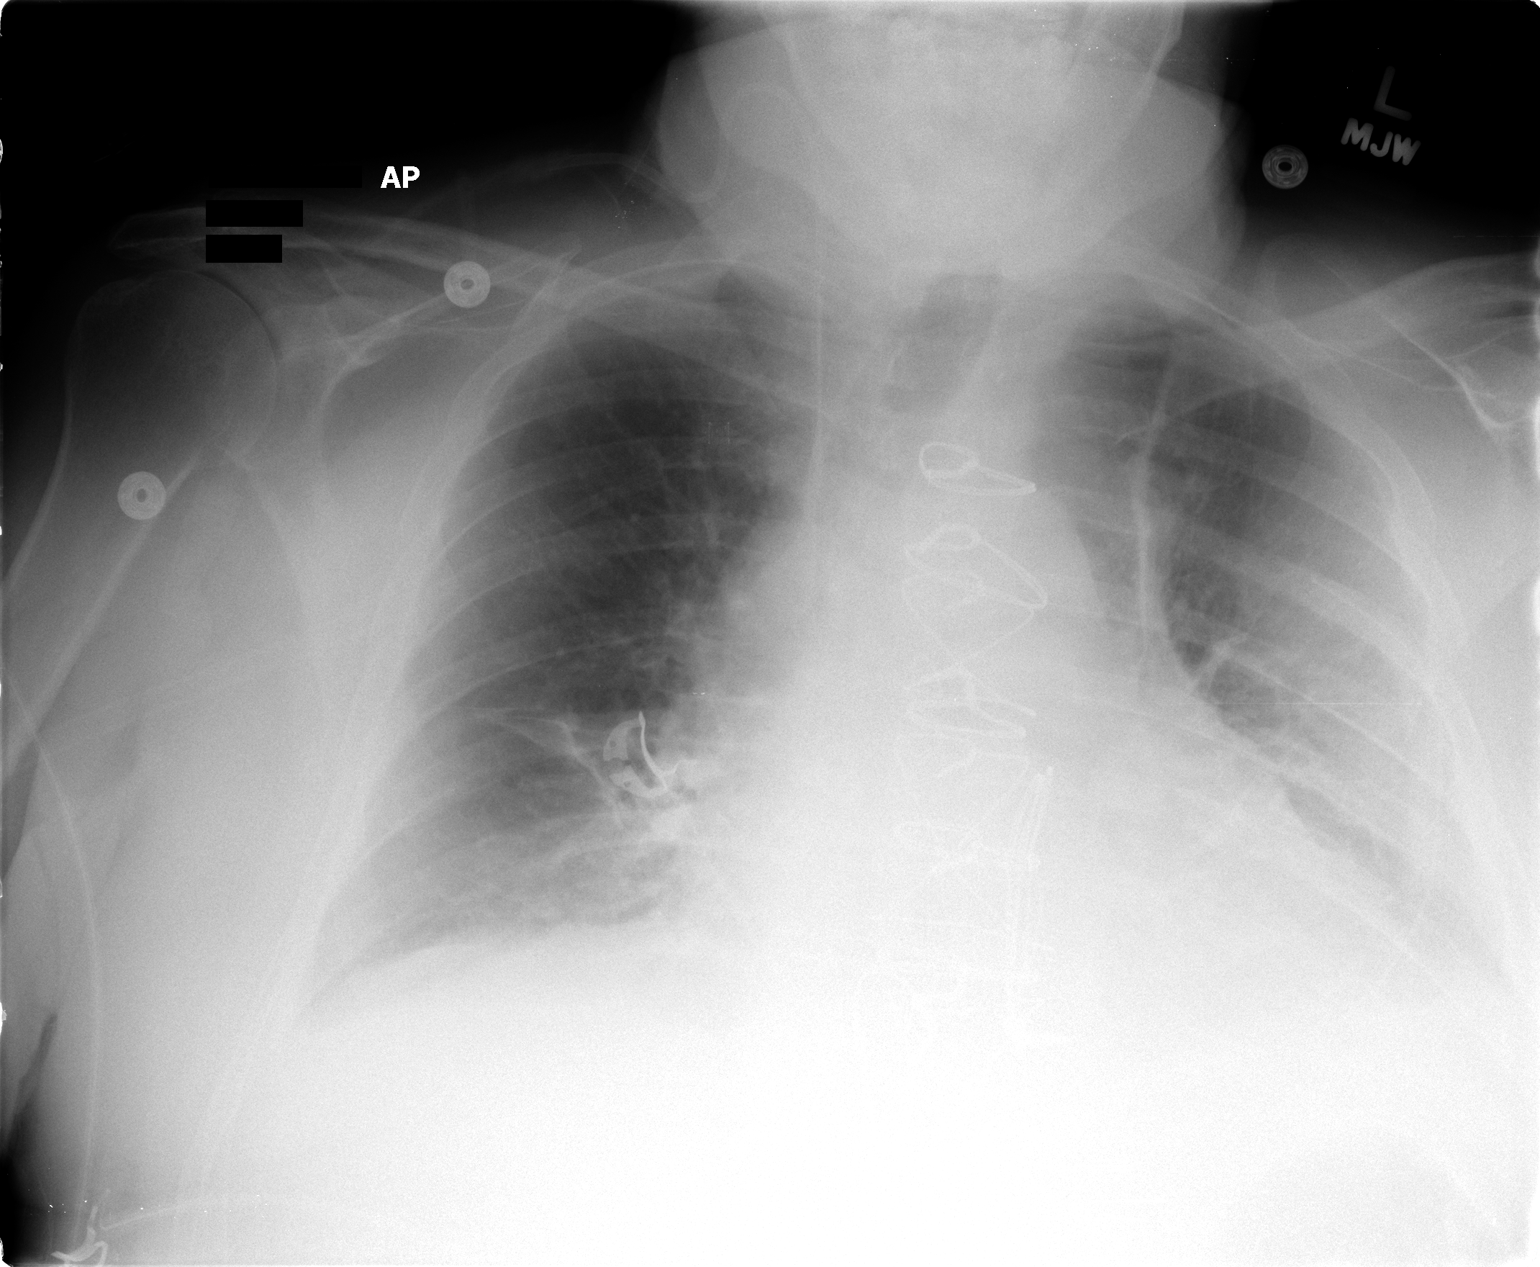

[1 of 1 positions shown; findings below may reference images not displayed]

FINDINGS: Right central line tip poorly delineated on the present
examination secondary to overlapping structures.

Cardiomegaly.  Mediastinum is prominent although unchanged.
Central pulmonary vascular congestion has decreased slightly since
prior exam.
IMPRESSION: Slight decrease in degree of pulmonary vascular congestion.  Please
see above.

## 2013-03-26 ENCOUNTER — Encounter: Payer: Self-pay | Admitting: *Deleted

## 2013-04-01 ENCOUNTER — Ambulatory Visit (INDEPENDENT_AMBULATORY_CARE_PROVIDER_SITE_OTHER): Payer: Medicare Other | Admitting: Cardiovascular Disease

## 2013-04-01 ENCOUNTER — Encounter: Payer: Self-pay | Admitting: Cardiovascular Disease

## 2013-04-01 VITALS — BP 120/80 | HR 69 | Ht 70.0 in | Wt 259.0 lb

## 2013-04-01 DIAGNOSIS — R609 Edema, unspecified: Secondary | ICD-10-CM

## 2013-04-01 DIAGNOSIS — I25810 Atherosclerosis of coronary artery bypass graft(s) without angina pectoris: Secondary | ICD-10-CM

## 2013-04-01 DIAGNOSIS — R600 Localized edema: Secondary | ICD-10-CM

## 2013-04-01 DIAGNOSIS — I4891 Unspecified atrial fibrillation: Secondary | ICD-10-CM

## 2013-04-01 LAB — BASIC METABOLIC PANEL
BUN: 15 mg/dL (ref 6–23)
Chloride: 102 mEq/L (ref 96–112)
GFR: 54.99 mL/min — ABNORMAL LOW (ref >60.00)
Potassium: 4.1 mEq/L (ref 3.5–5.1)
Sodium: 136 mEq/L (ref 135–145)

## 2013-04-01 MED ORDER — FUROSEMIDE 80 MG PO TABS: 80.0000 mg | ORAL_TABLET | Freq: Every day | ORAL | Status: DC

## 2013-04-01 NOTE — Patient Instructions (Addendum)
Your physician wants you to follow-up in: 6 months.   You will receive a reminder letter in the mail two months in advance. If you don't receive a letter, please call our office to schedule the follow-up appointment.   Your physician has recommended you make the following change in your medication: Increase furosemide to 80 mg by mouth daily   

## 2013-04-01 NOTE — Progress Notes (Signed)
History of Present Illness: 79 yo WM with history of CAD s/p CABG 4/13, HTN, HLD, atrial fibrillation with recent admission with NSTEMI at [facility] 10/25/11. Cardiac cath 10/28/11 and found to have a totally occluded LAD and RCA with high grade disease in the obtuse marginal branch of the Circumflex. I attempted PCI of the mid LAD as this appeared that it could be acute but was unable to cross the total occlusion. He underwent 4V CABG 11/01/11 per Dr. Dorris Fetch. ((left internal mammary artery to left anterior descending, saphenous vein graft to posterior descending, saphenous vein graft to first diagonal, saphenous vein graft to obtuse marginal 1). He had to undergo a second surgery Nov 06, 2011 for sternal dehiscence. He did have isolated post-op atrial fibrillation and was started on amiodarone. He was discharged home on 11/10/11. He has done well since then.   He is here for follow up. He tells me that feel ok. No chest pain. He does have dyspnea. He has gained 40 lbs over last 16 months. Some LE edema.   Primary Care Physician: Delfina Redwood, MD [facility])  Last Lipid Profile:Lipid Panel     Component Value Date/Time   CHOL 166 10/26/2011 0205   TRIG 131 10/26/2011 0205   HDL 54 10/26/2011 0205   CHOLHDL 3.1 10/26/2011 0205   VLDL 26 10/26/2011 0205   LDLCALC 86 10/26/2011 0205     Past Medical History  Diagnosis Date  . Myocardial infarct   . Hypertension   . Hypercholesteremia   . Arthritis   . Coronary artery disease     s/p PCI 10 yrs ago.    Past Surgical History  Procedure Laterality Date  . Cardiac stents    . Hemorroidectomy    . Coronary artery bypass graft  11/01/2011    Procedure: CORONARY ARTERY BYPASS GRAFTING (CABG);  Surgeon: Loreli Slot, MD;  Location: [facility] OR;  Service: Open Heart Surgery;  Laterality: N/A;  Coronary Artery bypass graft times four utilizing the left internal mammary artery and the right greater saphenous vein harvested  endoscopically.  . Sternal incision reclosure  11/06/2011    Procedure: STERNAL REWIRING;  Surgeon: Loreli Slot, MD;  Location: [facility] OR;  Service: Open Heart Surgery;  Laterality: N/A;    Current Outpatient Prescriptions  Medication Sig Dispense Refill  . aspirin 325 MG tablet Take 325 mg by mouth daily.      Marland Kitchen atorvastatin (LIPITOR) 40 MG tablet Take 40 mg by mouth at bedtime.      Marland Kitchen doxylamine, Sleep, (UNISOM) 25 MG tablet Take 25 mg by mouth at bedtime as needed.      Marland Kitchen escitalopram (LEXAPRO) 10 MG tablet Take 5 mg by mouth at bedtime.      . furosemide (LASIX) 40 MG tablet Take 40 mg by mouth.      . metoprolol tartrate (LOPRESSOR) 50 MG tablet Take 1 tablet (50 mg total) by mouth 2 (two) times daily.  60 tablet  1  . omeprazole (PRILOSEC) 20 MG capsule Take 20 mg by mouth daily.      . ramipril (ALTACE) 2.5 MG capsule Take 2.5 mg by mouth daily.       No current facility-administered medications for this visit.    No Known Allergies  History   Social History  . Marital Status: Widowed    Spouse Name: N/A    Number of Children: N/A  . Years of Education: N/A   Occupational History  .  Warehouse Supervisor    Social History Main Topics  . Smoking status: Former Smoker    Types: Cigarettes    Quit date: 07/09/1971  . Smokeless tobacco: Not on file  . Alcohol Use: No  . Drug Use: No  . Sexual Activity: Not on file   Other Topics Concern  . Not on file   Social History Narrative  . No narrative on file    No family history on file.  Review of Systems:  As stated in the HPI and otherwise negative.   BP 120/80  Pulse 69  Ht 5\' 10"  (1.778 m)  Wt 259 lb (117.482 kg)  BMI 37.16 kg/m2  Physical Examination: General: Well developed, well nourished, NAD HEENT: OP clear, mucus membranes moist SKIN: warm, dry. No rashes. Neuro: No focal deficits Musculoskeletal: Muscle strength 5/5 all ext Psychiatric: Mood and affect normal Neck: No JVD, no carotid bruits,  no thyromegaly, no lymphadenopathy. Lungs:Clear bilaterally, no wheezes, rhonci, crackles Cardiovascular: Regular rate and rhythm. No murmurs, gallops or rubs. Abdomen:Soft. Bowel sounds present. Non-tender.  Extremities: No lower extremity edema. Pulses are 2 + in the bilateral DP/PT.  Assessment and Plan:   1. CAD: Stable s/p bypass.  Will continue beta blocker, statin and ASA.     2. Lower extremity edema: Still with edema. Will increase Lasix to 80 mg po Qdaily.    3. Atrial fibrillation:  He is maintaining NSR.

## 2013-06-24 ENCOUNTER — Encounter: Payer: Self-pay | Admitting: Cardiovascular Disease

## 2013-12-10 ENCOUNTER — Encounter: Payer: Self-pay | Admitting: Cardiovascular Disease

## 2013-12-10 ENCOUNTER — Ambulatory Visit (INDEPENDENT_AMBULATORY_CARE_PROVIDER_SITE_OTHER): Payer: Medicare Other | Admitting: Cardiovascular Disease

## 2013-12-10 VITALS — BP 123/73 | HR 65 | Ht 70.0 in | Wt 247.0 lb

## 2013-12-10 DIAGNOSIS — R609 Edema, unspecified: Secondary | ICD-10-CM

## 2013-12-10 DIAGNOSIS — R600 Localized edema: Secondary | ICD-10-CM

## 2013-12-10 DIAGNOSIS — I4891 Unspecified atrial fibrillation: Secondary | ICD-10-CM

## 2013-12-10 DIAGNOSIS — I2510 Atherosclerotic heart disease of native coronary artery without angina pectoris: Secondary | ICD-10-CM

## 2013-12-10 NOTE — Patient Instructions (Signed)
Your physician wants you to follow-up in:  12 months.  You will receive a reminder letter in the mail two months in advance. If you don't receive a letter, please call our office to schedule the follow-up appointment.   

## 2013-12-10 NOTE — Progress Notes (Signed)
History of Present Illness: 80 yo WM with history of CAD s/p CABG 4/13, HTN, HLD, atrial fibrillation here for cardiac follow up. Admitted to [facility] April 2013 with a NSTEMI. Cardiac cath 10/28/11 and found to have a totally occluded LAD and RCA with high grade disease in the obtuse marginal branch of the Circumflex. I attempted PCI of the mid LAD as this appeared that it could be acute but was unable to cross the total occlusion. He underwent 4V CABG 11/01/11 per Dr. Roxan Hockey. ((left internal mammary artery to left anterior descending, saphenous vein graft to posterior descending, saphenous vein graft to first diagonal, saphenous vein graft to obtuse marginal 1). He had to undergo a second surgery Nov 06, 2011 for sternal dehiscence. He did have isolated post-op atrial fibrillation and was started on amiodarone.  He is here for follow up. He tells me that feel ok. No chest pain. He does have dyspnea. He has gained 40 lbs over last 16 months. Some LE edema.   Primary Care Physician: Cristie Hem, MD ([place])  Last Lipid Profile: Followed in primary care. Scanned in EPIC.    Past Medical History  Diagnosis Date  . Myocardial infarct   . Hypertension   . Hypercholesteremia   . Arthritis   . Coronary artery disease     s/p PCI 10 yrs ago.    Past Surgical History  Procedure Laterality Date  . Cardiac stents    . Hemorroidectomy    . Coronary artery bypass graft  11/01/2011    Procedure: CORONARY ARTERY BYPASS GRAFTING (CABG);  Surgeon: Melrose Nakayama, MD;  Location: [place];  Service: Open Heart Surgery;  Laterality: N/A;  Coronary Artery bypass graft times four utilizing the left internal mammary artery and the right greater saphenous vein harvested endoscopically.  . Sternal incision reclosure  11/06/2011    Procedure: STERNAL REWIRING;  Surgeon: Melrose Nakayama, MD;  Location: [place];  Service: Open Heart Surgery;  Laterality: N/A;    Current Outpatient  Prescriptions  Medication Sig Dispense Refill  . aspirin 325 MG tablet Take 325 mg by mouth daily.      Marland Kitchen atorvastatin (LIPITOR) 40 MG tablet Take 40 mg by mouth at bedtime.      Marland Kitchen doxylamine, Sleep, (UNISOM) 25 MG tablet Take 25 mg by mouth at bedtime as needed.      Marland Kitchen escitalopram (LEXAPRO) 10 MG tablet Take 5 mg by mouth at bedtime.      . furosemide (LASIX) 80 MG tablet Take 1 tablet (80 mg total) by mouth daily.  30 tablet  6  . metoprolol tartrate (LOPRESSOR) 50 MG tablet Take 1 tablet (50 mg total) by mouth 2 (two) times daily.  60 tablet  1  . omeprazole (PRILOSEC) 20 MG capsule Take 20 mg by mouth daily.      . ramipril (ALTACE) 2.5 MG capsule Take 2.5 mg by mouth daily.       No current facility-administered medications for this visit.    No Known Allergies  History   Social History  . Marital Status: Widowed    Spouse Name: N/A    Number of Children: N/A  . Years of Education: N/A   Occupational History  . Warehouse Supervisor    Social History Main Topics  . Smoking status: Former Smoker    Types: Cigarettes    Quit date: 07/09/1971  . Smokeless tobacco: Not on file  . Alcohol Use: No  .  Drug Use: No  . Sexual Activity: Not on file   Other Topics Concern  . Not on file   Social History Narrative  . No narrative on file    No family history on file.  Review of Systems:  As stated in the HPI and otherwise negative.   BP 123/73  Pulse 65  Ht 5\' 10"  (1.778 m)  Wt 247 lb (112.038 kg)  BMI 35.44 kg/m2  Physical Examination: General: Well developed, well nourished, NAD HEENT: OP clear, mucus membranes moist SKIN: warm, dry. No rashes. Neuro: No focal deficits Musculoskeletal: Muscle strength 5/5 all ext Psychiatric: Mood and affect normal Neck: No JVD, no carotid bruits, no thyromegaly, no lymphadenopathy. Lungs:Clear bilaterally, no wheezes, rhonci, crackles Cardiovascular: Regular rate and rhythm. No murmurs, gallops or rubs. Abdomen:Soft. Bowel  sounds present. Non-tender.  Extremities: No lower extremity edema. Pulses are 2 + in the bilateral DP/PT.  EKG: NSR, rate 62 bpm. PVC.   Assessment and Plan:   1. CAD: Stable s/p bypass.  Will continue beta blocker, statin and ASA.     2. Lower extremity edema: Still with edema. Will increase Lasix to 80 mg po Qdaily.    3. Atrial fibrillation:  He is maintaining NSR.

## 2014-01-29 ENCOUNTER — Other Ambulatory Visit: Payer: Self-pay | Admitting: Cardiovascular Disease

## 2014-06-16 ENCOUNTER — Encounter (HOSPITAL_COMMUNITY): Payer: Self-pay | Admitting: Cardiovascular Disease

## 2015-07-11 ENCOUNTER — Ambulatory Visit (INDEPENDENT_AMBULATORY_CARE_PROVIDER_SITE_OTHER): Payer: Medicare PPO | Admitting: Cardiovascular Disease

## 2015-07-11 VITALS — BP 118/64 | HR 67 | Ht 70.0 in | Wt 232.0 lb

## 2015-07-11 DIAGNOSIS — I2510 Atherosclerotic heart disease of native coronary artery without angina pectoris: Secondary | ICD-10-CM | POA: Diagnosis not present

## 2015-07-11 DIAGNOSIS — I5032 Chronic diastolic (congestive) heart failure: Secondary | ICD-10-CM

## 2015-07-11 DIAGNOSIS — I4891 Unspecified atrial fibrillation: Secondary | ICD-10-CM

## 2015-07-11 NOTE — Patient Instructions (Signed)

## 2015-07-11 NOTE — Progress Notes (Signed)
Chief Complaint  Patient presents with  . Shortness of Breath      History of Present Illness: 82 yo WM with history of CAD s/p CABG 4/13, HTN, HLD, atrial fibrillation here for cardiac follow up. Admitted to [facility] April 2013 with a NSTEMI. Cardiac cath 10/28/11 and found to have a totally occluded LAD and RCA with high grade disease in the obtuse marginal branch of the Circumflex. I attempted PCI of the mid LAD as this appeared that it could be acute but was unable to cross the total occlusion. He underwent 4V CABG 11/01/11 per Dr. Roxan Hockey. ((left internal mammary artery to left anterior descending, saphenous vein graft to posterior descending, saphenous vein graft to first diagonal, saphenous vein graft to obtuse marginal 1). He had to undergo a second surgery Nov 06, 2011 for sternal dehiscence. He did have isolated post-op atrial fibrillation and was started on amiodarone.  He is here for follow up. He tells me that feel well. No chest pain. He does have dyspnea with moderate exertion, unchanged over last year. Playing golf several days per week. He hit 3 "[place]" in 2016. No edema.   Primary Care Physician: Cristie Hem, MD ([place])  Last Lipid Profile: Followed in primary care.    Past Medical History  Diagnosis Date  . Myocardial infarct   . Hypertension   . Hypercholesteremia   . Arthritis   . Coronary artery disease     s/p PCI 10 yrs ago.    Past Surgical History  Procedure Laterality Date  . Cardiac stents    . Hemorroidectomy    . Coronary artery bypass graft  11/01/2011    Procedure: CORONARY ARTERY BYPASS GRAFTING (CABG);  Surgeon: Melrose Nakayama, MD;  Location: [place];  Service: Open Heart Surgery;  Laterality: N/A;  Coronary Artery bypass graft times four utilizing the left internal mammary artery and the right greater saphenous vein harvested endoscopically.  . Sternal incision reclosure  11/06/2011    Procedure: STERNAL REWIRING;   Surgeon: Melrose Nakayama, MD;  Location: [place];  Service: Open Heart Surgery;  Laterality: N/A;  . Left heart catheterization with coronary angiogram N/A 10/28/2011    Procedure: LEFT HEART CATHETERIZATION WITH CORONARY ANGIOGRAM;  Surgeon: Burnell Blanks, MD;  Location: [facility] CATH LAB;  Service: Cardiovascular;  Laterality: N/A;  . Percutaneous coronary intervention-balloon only  10/28/2011    Procedure: PERCUTANEOUS CORONARY INTERVENTION-BALLOON ONLY;  Surgeon: Burnell Blanks, MD;  Location: [facility] CATH LAB;  Service: Cardiovascular;;    Current Outpatient Prescriptions  Medication Sig Dispense Refill  . aspirin 325 MG tablet Take 325 mg by mouth daily.    Marland Kitchen atorvastatin (LIPITOR) 40 MG tablet Take 40 mg by mouth at bedtime.    Marland Kitchen doxylamine, Sleep, (UNISOM) 25 MG tablet Take 25 mg by mouth at bedtime as needed.    . metoprolol tartrate (LOPRESSOR) 50 MG tablet Take 1 tablet (50 mg total) by mouth 2 (two) times daily. 60 tablet 1  . omeprazole (PRILOSEC) 20 MG capsule Take 20 mg by mouth daily.    . ramipril (ALTACE) 2.5 MG capsule Take 2.5 mg by mouth daily.     No current facility-administered medications for this visit.    No Known Allergies  Social History   Social History  . Marital Status: Widowed    Spouse Name: N/A  . Number of Children: N/A  . Years of Education: N/A   Occupational History  . Warehouse  Supervisor    Social History Main Topics  . Smoking status: Former Smoker    Types: Cigarettes    Quit date: 07/09/1971  . Smokeless tobacco: Not on file  . Alcohol Use: No  . Drug Use: No  . Sexual Activity: Not on file   Other Topics Concern  . Not on file   Social History Narrative    No family history on file.  Review of Systems:  As stated in the HPI and otherwise negative.   BP 118/64 mmHg  Pulse 67  Ht 5\' 10"  (1.778 m)  Wt 232 lb (105.235 kg)  BMI 33.29 kg/m2  Physical Examination: General: Well developed, well nourished,  NAD HEENT: OP clear, mucus membranes moist SKIN: warm, dry. No rashes. Neuro: No focal deficits Musculoskeletal: Muscle strength 5/5 all ext Psychiatric: Mood and affect normal Neck: No JVD, no carotid bruits, no thyromegaly, no lymphadenopathy. Lungs:Clear bilaterally, no wheezes, rhonci, crackles Cardiovascular: Regular rate and rhythm. No murmurs, gallops or rubs. Abdomen:Soft. Bowel sounds present. Non-tender.  Extremities: No lower extremity edema. Pulses are 2 + in the bilateral DP/PT.  EKG:  EKG is ordered today. The ekg ordered today demonstrates NSR, rate 67 bpm.   Recent Labs: No results found for requested labs within last 365 days.   Lipid Panel Followed in primary care.    Wt Readings from Last 3 Encounters:  07/11/15 232 lb (105.235 kg)  12/10/13 247 lb (112.038 kg)  04/01/13 259 lb (117.482 kg)     Other studies Reviewed: Additional studies/ records that were reviewed today include: . Review of the above records demonstrates:    Assessment and Plan:   1. CAD: Stable s/p bypass.  Will continue beta blocker, statin and ASA.   2.Atrial fibrillation, post-op:  He is maintaining NSR.   3. Chronic diastolic CHF: He has stopped taking lasix. No edema. Weight down. He can use Lasix prn.   Current medicines are reviewed at length with the patient today.  The patient does not have concerns regarding medicines.  The following changes have been made:  no change  Labs/ tests ordered today include:   Orders Placed This Encounter  Procedures  . EKG 12-Lead     Disposition:   FU with me in 12  months   Signed, Lauree Chandler, MD 07/11/2015 9:41 AM    [facility] [place], [place], [place]  [address] Phone: [phone]; Fax: [phone]

## 2015-08-18 ENCOUNTER — Telehealth: Payer: Self-pay | Admitting: Cardiovascular Disease

## 2015-08-18 NOTE — Telephone Encounter (Signed)
New message     Pt is in [place].  He needs to have a CT scan.   Pt needs make and model number  of his stents he had put in around 2012.  Please call daughter with info

## 2015-08-18 NOTE — Telephone Encounter (Signed)
Spoke with pt's daughter and pt needs to have CT scan.  Stent was placed 10 years ago in [place].  I told her it was OK for pt to have CT Scan.  If pt is going to have MRI they will need to check on type of scanner.  I told her we did not have records on type of stent placed in [place].

## 2016-05-21 ENCOUNTER — Emergency Department (HOSPITAL_COMMUNITY): Payer: Medicare PPO

## 2016-05-21 ENCOUNTER — Inpatient Hospital Stay (HOSPITAL_COMMUNITY)
Admission: EM | Admit: 2016-05-21 | Discharge: 2016-06-03 | DRG: 854 | Disposition: A | Discharge status: Skilled Nursing Facility | Payer: Medicare PPO | Attending: Family Medicine | Admitting: Family Medicine

## 2016-05-21 ENCOUNTER — Encounter (HOSPITAL_COMMUNITY): Payer: Self-pay | Admitting: *Deleted

## 2016-05-21 ENCOUNTER — Inpatient Hospital Stay (HOSPITAL_COMMUNITY): Payer: Medicare PPO

## 2016-05-21 DIAGNOSIS — G20 Parkinson's disease: Secondary | ICD-10-CM | POA: Diagnosis present

## 2016-05-21 DIAGNOSIS — R601 Generalized edema: Secondary | ICD-10-CM | POA: Diagnosis present

## 2016-05-21 DIAGNOSIS — D1803 Hemangioma of intra-abdominal structures: Secondary | ICD-10-CM | POA: Diagnosis present

## 2016-05-21 DIAGNOSIS — I2510 Atherosclerotic heart disease of native coronary artery without angina pectoris: Secondary | ICD-10-CM | POA: Diagnosis present

## 2016-05-21 DIAGNOSIS — R7881 Bacteremia: Secondary | ICD-10-CM | POA: Diagnosis present

## 2016-05-21 DIAGNOSIS — D72829 Elevated white blood cell count, unspecified: Secondary | ICD-10-CM

## 2016-05-21 DIAGNOSIS — I4891 Unspecified atrial fibrillation: Secondary | ICD-10-CM | POA: Diagnosis present

## 2016-05-21 DIAGNOSIS — M6281 Muscle weakness (generalized): Secondary | ICD-10-CM

## 2016-05-21 DIAGNOSIS — N179 Acute kidney failure, unspecified: Secondary | ICD-10-CM | POA: Diagnosis present

## 2016-05-21 DIAGNOSIS — G20A1 Parkinson's disease without dyskinesia, without mention of fluctuations: Secondary | ICD-10-CM | POA: Diagnosis present

## 2016-05-21 DIAGNOSIS — K810 Acute cholecystitis: Secondary | ICD-10-CM | POA: Diagnosis present

## 2016-05-21 DIAGNOSIS — Z951 Presence of aortocoronary bypass graft: Secondary | ICD-10-CM

## 2016-05-21 DIAGNOSIS — Z87891 Personal history of nicotine dependence: Secondary | ICD-10-CM

## 2016-05-21 DIAGNOSIS — A4151 Sepsis due to Escherichia coli [E. coli]: Principal | ICD-10-CM | POA: Diagnosis present

## 2016-05-21 DIAGNOSIS — D6489 Other specified anemias: Secondary | ICD-10-CM | POA: Diagnosis present

## 2016-05-21 DIAGNOSIS — R109 Unspecified abdominal pain: Secondary | ICD-10-CM | POA: Diagnosis present

## 2016-05-21 DIAGNOSIS — T148XXA Other injury of unspecified body region, initial encounter: Secondary | ICD-10-CM

## 2016-05-21 DIAGNOSIS — I10 Essential (primary) hypertension: Secondary | ICD-10-CM | POA: Diagnosis present

## 2016-05-21 DIAGNOSIS — E7800 Pure hypercholesterolemia, unspecified: Secondary | ICD-10-CM | POA: Diagnosis present

## 2016-05-21 DIAGNOSIS — K8000 Calculus of gallbladder with acute cholecystitis without obstruction: Secondary | ICD-10-CM | POA: Diagnosis present

## 2016-05-21 DIAGNOSIS — A419 Sepsis, unspecified organism: Secondary | ICD-10-CM | POA: Diagnosis not present

## 2016-05-21 DIAGNOSIS — R945 Abnormal results of liver function studies: Secondary | ICD-10-CM

## 2016-05-21 DIAGNOSIS — M1990 Unspecified osteoarthritis, unspecified site: Secondary | ICD-10-CM | POA: Diagnosis present

## 2016-05-21 DIAGNOSIS — B9620 Unspecified Escherichia coli [E. coli] as the cause of diseases classified elsewhere: Secondary | ICD-10-CM | POA: Diagnosis present

## 2016-05-21 DIAGNOSIS — Z7982 Long term (current) use of aspirin: Secondary | ICD-10-CM | POA: Diagnosis not present

## 2016-05-21 DIAGNOSIS — Z8249 Family history of ischemic heart disease and other diseases of the circulatory system: Secondary | ICD-10-CM

## 2016-05-21 DIAGNOSIS — R1310 Dysphagia, unspecified: Secondary | ICD-10-CM | POA: Diagnosis present

## 2016-05-21 DIAGNOSIS — K8020 Calculus of gallbladder without cholecystitis without obstruction: Secondary | ICD-10-CM | POA: Insufficient documentation

## 2016-05-21 DIAGNOSIS — R7989 Other specified abnormal findings of blood chemistry: Secondary | ICD-10-CM | POA: Diagnosis not present

## 2016-05-21 DIAGNOSIS — I252 Old myocardial infarction: Secondary | ICD-10-CM | POA: Diagnosis not present

## 2016-05-21 DIAGNOSIS — E876 Hypokalemia: Secondary | ICD-10-CM | POA: Diagnosis present

## 2016-05-21 DIAGNOSIS — K5900 Constipation, unspecified: Secondary | ICD-10-CM | POA: Diagnosis present

## 2016-05-21 DIAGNOSIS — R600 Localized edema: Secondary | ICD-10-CM

## 2016-05-21 DIAGNOSIS — E785 Hyperlipidemia, unspecified: Secondary | ICD-10-CM | POA: Diagnosis not present

## 2016-05-21 DIAGNOSIS — Z955 Presence of coronary angioplasty implant and graft: Secondary | ICD-10-CM

## 2016-05-21 HISTORY — DX: Chronic obstructive pulmonary disease, unspecified: J44.9

## 2016-05-21 HISTORY — DX: Parkinson's disease: G20

## 2016-05-21 HISTORY — DX: Essential (primary) hypertension: I10

## 2016-05-21 HISTORY — DX: Parkinson's disease without dyskinesia, without mention of fluctuations: G20.A1

## 2016-05-21 LAB — COMPREHENSIVE METABOLIC PANEL
ALK PHOS: 398 U/L — AB (ref 38–126)
ALT: 220 U/L — AB (ref 17–63)
ANION GAP: 11 (ref 5–15)
AST: 418 U/L — ABNORMAL HIGH (ref 15–41)
Albumin: 2.8 g/dL — ABNORMAL LOW (ref 3.5–5.0)
BUN: 39 mg/dL — ABNORMAL HIGH (ref 6–20)
CALCIUM: 8.3 mg/dL — AB (ref 8.9–10.3)
CO2: 24 mmol/L (ref 22–32)
CREATININE: 1.65 mg/dL — AB (ref 0.61–1.24)
Chloride: 97 mmol/L — ABNORMAL LOW (ref 101–111)
GFR, EST AFRICAN AMERICAN: 43 mL/min — AB (ref >60)
GFR, EST NON AFRICAN AMERICAN: 37 mL/min — AB (ref >60)
Glucose, Bld: 135 mg/dL — ABNORMAL HIGH (ref 65–99)
Potassium: 3.3 mmol/L — ABNORMAL LOW (ref 3.5–5.1)
SODIUM: 132 mmol/L — AB (ref 135–145)
Total Bilirubin: 4.2 mg/dL — ABNORMAL HIGH (ref 0.3–1.2)
Total Protein: 6.8 g/dL (ref 6.5–8.1)

## 2016-05-21 LAB — BILIRUBIN, FRACTIONATED(TOT/DIR/INDIR)
BILIRUBIN TOTAL: 4.4 mg/dL — AB (ref 0.3–1.2)
Bilirubin, Direct: 2.3 mg/dL — ABNORMAL HIGH (ref 0.1–0.5)
Indirect Bilirubin: 2.1 mg/dL — ABNORMAL HIGH (ref 0.3–0.9)

## 2016-05-21 LAB — CBC WITH DIFFERENTIAL/PLATELET
BASOS ABS: 0 10*3/uL (ref 0.0–0.1)
BASOS PCT: 0 %
EOS ABS: 0 10*3/uL (ref 0.0–0.7)
Eosinophils Relative: 0 %
HEMATOCRIT: 36.3 % — AB (ref 39.0–52.0)
HEMOGLOBIN: 11.8 g/dL — AB (ref 13.0–17.0)
Lymphocytes Relative: 4 %
Lymphs Abs: 0.6 10*3/uL — ABNORMAL LOW (ref 0.7–4.0)
MCH: 24.4 pg — ABNORMAL LOW (ref 26.0–34.0)
MCHC: 32.5 g/dL (ref 30.0–36.0)
MCV: 75.2 fL — ABNORMAL LOW (ref 78.0–100.0)
MONOS PCT: 7 %
Monocytes Absolute: 1 10*3/uL (ref 0.1–1.0)
NEUTROS ABS: 13.5 10*3/uL — AB (ref 1.7–7.7)
NEUTROS PCT: 89 %
Platelets: 227 10*3/uL (ref 150–400)
RBC: 4.83 MIL/uL (ref 4.22–5.81)
RDW: 16.4 % — ABNORMAL HIGH (ref 11.5–15.5)
WBC: 15 10*3/uL — AB (ref 4.0–10.5)

## 2016-05-21 LAB — CG4 I-STAT (LACTIC ACID): Lactic Acid, Venous: 1.8 mmol/L (ref 0.5–1.9)

## 2016-05-21 LAB — URINE MICROSCOPIC-ADD ON

## 2016-05-21 LAB — URINALYSIS, ROUTINE W REFLEX MICROSCOPIC
GLUCOSE, UA: NEGATIVE mg/dL
KETONES UR: NEGATIVE mg/dL
LEUKOCYTES UA: NEGATIVE
NITRITE: NEGATIVE
PH: 5.5 (ref 5.0–8.0)
Protein, ur: 30 mg/dL — AB
SPECIFIC GRAVITY, URINE: 1.025 (ref 1.005–1.030)

## 2016-05-21 LAB — LIPASE, BLOOD: Lipase: 31 U/L (ref 11–51)

## 2016-05-21 LAB — POCT I-STAT TROPONIN I: TROPONIN I, POC: 0 ng/mL (ref 0.00–0.08)

## 2016-05-21 MED ORDER — ONDANSETRON HCL 4 MG/2ML IJ SOLN
4.0000 mg | Freq: Four times a day (QID) | INTRAMUSCULAR | Status: DC | PRN
  Administered 2016-05-26: 4 mg via INTRAVENOUS
  Filled 2016-05-21: qty 2

## 2016-05-21 MED ORDER — PIPERACILLIN-TAZOBACTAM 3.375 G IVPB
3.3750 g | Freq: Three times a day (TID) | INTRAVENOUS | Status: DC
  Administered 2016-05-21 – 2016-05-23 (×8): 3.375 g via INTRAVENOUS
  Filled 2016-05-21 (×8): qty 50

## 2016-05-21 MED ORDER — DEXTROSE-NACL 5-0.9 % IV SOLN
INTRAVENOUS | Status: DC
  Administered 2016-05-21: 07:00:00 via INTRAVENOUS

## 2016-05-21 MED ORDER — ASPIRIN 81 MG PO CHEW: 81.0000 mg | CHEWABLE_TABLET | Freq: Every day | ORAL | Status: DC

## 2016-05-21 MED ORDER — CARBIDOPA-LEVODOPA 10-100 MG PO TABS
1.0000 | ORAL_TABLET | Freq: Three times a day (TID) | ORAL | Status: DC
  Administered 2016-05-21 – 2016-06-03 (×35): 1 via ORAL
  Filled 2016-05-21 (×46): qty 1

## 2016-05-21 MED ORDER — ONDANSETRON HCL 4 MG PO TABS: 4.0000 mg | ORAL_TABLET | Freq: Four times a day (QID) | ORAL | Status: DC | PRN

## 2016-05-21 MED ORDER — SODIUM CHLORIDE 0.9 % IV BOLUS (SEPSIS)
1000.0000 mL | Freq: Once | INTRAVENOUS | Status: AC
  Administered 2016-05-21: 1000 mL via INTRAVENOUS

## 2016-05-21 MED ORDER — VANCOMYCIN HCL IN DEXTROSE 1-5 GM/200ML-% IV SOLN
1000.0000 mg | Freq: Once | INTRAVENOUS | Status: AC
  Administered 2016-05-21: 1000 mg via INTRAVENOUS
  Filled 2016-05-21: qty 200

## 2016-05-21 MED ORDER — SODIUM CHLORIDE 0.9% FLUSH
3.0000 mL | Freq: Two times a day (BID) | INTRAVENOUS | Status: DC
  Administered 2016-05-21 – 2016-06-03 (×24): 3 mL via INTRAVENOUS

## 2016-05-21 MED ORDER — HEPARIN SODIUM (PORCINE) 5000 UNIT/ML IJ SOLN
5000.0000 [IU] | Freq: Three times a day (TID) | INTRAMUSCULAR | Status: DC
  Administered 2016-05-21 – 2016-05-23 (×8): 5000 [IU] via SUBCUTANEOUS
  Filled 2016-05-21 (×8): qty 1

## 2016-05-21 MED ORDER — CHLORHEXIDINE GLUCONATE 0.12 % MT SOLN
15.0000 mL | Freq: Two times a day (BID) | OROMUCOSAL | Status: DC
  Administered 2016-05-21 – 2016-06-03 (×26): 15 mL via OROMUCOSAL
  Filled 2016-05-21 (×25): qty 15

## 2016-05-21 MED ORDER — METRONIDAZOLE IN NACL 5-0.79 MG/ML-% IV SOLN
500.0000 mg | Freq: Once | INTRAVENOUS | Status: AC
  Administered 2016-05-21: 500 mg via INTRAVENOUS
  Filled 2016-05-21: qty 100

## 2016-05-21 MED ORDER — ASPIRIN EC 81 MG PO TBEC
81.0000 mg | DELAYED_RELEASE_TABLET | Freq: Every day | ORAL | Status: DC
  Administered 2016-05-21 – 2016-05-23 (×3): 81 mg via ORAL
  Filled 2016-05-21 (×3): qty 1

## 2016-05-21 MED ORDER — METOPROLOL TARTRATE 25 MG PO TABS
12.5000 mg | ORAL_TABLET | Freq: Two times a day (BID) | ORAL | Status: DC
  Administered 2016-05-22 – 2016-06-03 (×22): 12.5 mg via ORAL
  Filled 2016-05-21 (×24): qty 1

## 2016-05-21 MED ORDER — ATORVASTATIN CALCIUM 40 MG PO TABS
40.0000 mg | ORAL_TABLET | Freq: Every day | ORAL | Status: DC
  Administered 2016-05-21 – 2016-06-02 (×12): 40 mg via ORAL
  Filled 2016-05-21 (×9): qty 1
  Filled 2016-05-21: qty 2
  Filled 2016-05-21 (×3): qty 1

## 2016-05-21 MED ORDER — HYDROMORPHONE HCL 1 MG/ML IJ SOLN
0.5000 mg | INTRAMUSCULAR | Status: DC | PRN
  Administered 2016-05-24 – 2016-05-28 (×4): 0.5 mg via INTRAVENOUS
  Filled 2016-05-21 (×4): qty 1
  Filled 2016-05-21: qty 0.5

## 2016-05-21 MED ORDER — ORAL CARE MOUTH RINSE
15.0000 mL | Freq: Two times a day (BID) | OROMUCOSAL | Status: DC
  Administered 2016-05-21 – 2016-06-03 (×16): 15 mL via OROMUCOSAL

## 2016-05-21 MED ORDER — ONDANSETRON HCL 4 MG/2ML IJ SOLN
4.0000 mg | Freq: Once | INTRAMUSCULAR | Status: AC
  Administered 2016-05-21: 4 mg via INTRAVENOUS
  Filled 2016-05-21: qty 2

## 2016-05-21 MED ORDER — POTASSIUM CHLORIDE IN NACL 40-0.9 MEQ/L-% IV SOLN
INTRAVENOUS | Status: DC
  Administered 2016-05-21: 75 mL/h via INTRAVENOUS

## 2016-05-21 MED ORDER — VANCOMYCIN HCL IN DEXTROSE 1-5 GM/200ML-% IV SOLN
1000.0000 mg | INTRAVENOUS | Status: DC
  Administered 2016-05-21: 1000 mg via INTRAVENOUS
  Filled 2016-05-21: qty 200

## 2016-05-21 MED ORDER — PIPERACILLIN-TAZOBACTAM 3.375 G IVPB 30 MIN
3.3750 g | Freq: Once | INTRAVENOUS | Status: AC
  Administered 2016-05-21: 3.375 g via INTRAVENOUS
  Filled 2016-05-21: qty 50

## 2016-05-21 MED ORDER — PANTOPRAZOLE SODIUM 40 MG PO TBEC
40.0000 mg | DELAYED_RELEASE_TABLET | Freq: Every day | ORAL | Status: DC
  Administered 2016-05-21 – 2016-06-03 (×11): 40 mg via ORAL
  Filled 2016-05-21 (×13): qty 1

## 2016-05-21 MED ORDER — IOPAMIDOL (ISOVUE-300) INJECTION 61%
INTRAVENOUS | Status: AC
  Administered 2016-05-21: 03:00:00
  Filled 2016-05-21: qty 50

## 2016-05-21 MED ORDER — MORPHINE SULFATE (PF) 4 MG/ML IV SOLN
4.0000 mg | Freq: Once | INTRAVENOUS | Status: AC
  Administered 2016-05-21: 4 mg via INTRAVENOUS
  Filled 2016-05-21: qty 1

## 2016-05-21 MED ORDER — METOPROLOL TARTRATE 50 MG PO TABS: 50.0000 mg | ORAL_TABLET | Freq: Two times a day (BID) | ORAL | Status: DC

## 2016-05-21 MED ORDER — ACETAMINOPHEN 650 MG RE SUPP
650.0000 mg | Freq: Four times a day (QID) | RECTAL | Status: DC | PRN
  Administered 2016-05-21: 650 mg via RECTAL
  Filled 2016-05-21: qty 1

## 2016-05-21 NOTE — Consult Note (Signed)
Referring Provider: Dr. Marin Comment Primary Care Physician:  Jesus Ranch, MD Primary Gastroenterologist:  Dr. Gala Romney   Date of Admission: 05/21/16 Date of Consultation: 05/21/16  Reason for Consultation:  Cholelithiasis   HPI:  Jesus Maldonado is an 82 y.o. year old male presenting with abdominal pain, found to have acute cholecystitis with cholelithiasis, elevated LFTs. US abdomen with upper normal common duct size and likely liver hemangioma. CBD 40m. Tbili 4.2, Alk Phos 398, ALT 220, and AST 418. Bilirubin has been requested to be fractionated. Leukocytosis noted with WBC 15. Hypotensive with systolicc in the 876O/11X   Noted abdominal pain for several days, worsening, located in upper abdomen. No N/V. Associated fever/chills. Tmax on admission 102.1. Daughter present and feels he may have had similar episodes in the past.  [place] about a day and  told it may be his gallbladder. No abdominal pain currently.   Past Medical History:  Diagnosis Date  . Arthritis   . Coronary artery disease    s/p PCI 10 yrs ago.  .Marland KitchenHypercholesteremia   . Hypertension   . Myocardial infarct   . Parkinson disease ([facility]     Past Surgical History:  Procedure Laterality Date  . cardiac stents    . CORONARY ARTERY BYPASS GRAFT  11/01/2011   Procedure: CORONARY ARTERY BYPASS GRAFTING (CABG);  Surgeon: SMelrose Nakayama MD;  [place] [facility]  Service: Open Heart Surgery;  Laterality: N/A;  Coronary Artery bypass graft times four utilizing the left internal mammary artery and the right greater saphenous vein harvested endoscopically.  .Marland KitchenHEMORROIDECTOMY    . LEFT HEART CATHETERIZATION WITH CORONARY ANGIOGRAM N/A 10/28/2011   Procedure: LEFT HEART CATHETERIZATION WITH CORONARY ANGIOGRAM;  Surgeon: CBurnell Blanks MD;  [facility];  Service: Cardiovascular;  Laterality: N/A;  . PERCUTANEOUS CORONARY INTERVENTION-BALLOON  ONLY  10/28/2011   Procedure: PERCUTANEOUS CORONARY INTERVENTION-BALLOON ONLY;  Surgeon: CBurnell Blanks MD;  [facility];  Service: Cardiovascular;;  . STERNAL INCISION RECLOSURE  11/06/2011   Procedure: STERNAL REWIRING;  Surgeon: SMelrose Nakayama MD;  [place]  Service: Open Heart Surgery;  Laterality: N/A;    Prior to Admission medications   Medication Sig Start Date End Date Taking? Authorizing Provider  aspirin 81 MG chewable tablet Chew 81 mg by mouth daily.   Yes Historical Provider, MD  atorvastatin (LIPITOR) 40 MG tablet Take 40 mg by mouth at bedtime.   Yes Historical Provider, MD  carbidopa-levodopa (SINEMET IR) 10-100 MG tablet Take 1 tablet by mouth 3 (three) times daily.   Yes Historical Provider, MD  metoprolol tartrate (LOPRESSOR) 50 MG tablet Take 1 tablet (50 mg total) by mouth 2 (two) times daily. 11/08/11  Yes Donielle MListon Alba PA-C  omeprazole (PRILOSEC) 20 MG capsule Take 20 mg by mouth daily.   Yes Historical Provider, MD  ramipril (ALTACE) 2.5 MG capsule Take 2.5 mg by mouth daily.   Yes Historical Provider, MD  aspirin 325 MG tablet Take 81 mg by mouth daily.     Historical Provider, MD  doxylamine, Sleep, (UNISOM) 25 MG tablet Take 25 mg by mouth at bedtime as needed.    Historical Provider, MD    Current Facility-Administered Medications  Medication Dose Route Frequency Provider Last Rate Last Dose  . 0.9 % NaCl with KCl 40 mEq / L  infusion   Intravenous Continuous DRexene Alberts MD 75 mL/hr at 05/21/16  3154 75 mL/hr at 05/21/16 0938  . acetaminophen (TYLENOL) suppository 650 mg  650 mg Rectal Q6H PRN Merryl Hacker, MD   650 mg at 05/21/16 0212  . aspirin chewable tablet 81 mg  81 mg Oral Daily Orvan Falconer, MD      . aspirin EC tablet 81 mg  81 mg Oral Daily Orvan Falconer, MD   81 mg at 05/21/16 1000  . atorvastatin (LIPITOR) tablet 40 mg  40 mg Oral QHS Orvan Falconer, MD      . carbidopa-levodopa (SINEMET IR) 10-100 MG per tablet immediate  release 1 tablet  1 tablet Oral TID Orvan Falconer, MD   1 tablet at 05/21/16 0959  . heparin injection 5,000 Units  5,000 Units Subcutaneous Q8H Orvan Falconer, MD   5,000 Units at 05/21/16 [phone]  . HYDROmorphone (DILAUDID) injection 0.5 mg  0.5 mg Intravenous Q2H PRN Orvan Falconer, MD      . metoprolol tartrate (LOPRESSOR) tablet 12.5 mg  12.5 mg Oral BID Rexene Alberts, MD      . ondansetron [facility]) tablet 4 mg  4 mg Oral Q6H PRN Orvan Falconer, MD       Or  . ondansetron [facility]) injection 4 mg  4 mg Intravenous Q6H PRN Orvan Falconer, MD      . pantoprazole (PROTONIX) EC tablet 40 mg  40 mg Oral Daily Orvan Falconer, MD   40 mg at 05/21/16 0959  . piperacillin-tazobactam (ZOSYN) IVPB 3.375 g  3.375 g Intravenous Q8H Rexene Alberts, MD   3.375 g at 05/21/16 0959  . sodium chloride flush (NS) 0.9 % injection 3 mL  3 mL Intravenous Q12H Orvan Falconer, MD   3 mL at 05/21/16 0959  . vancomycin (VANCOCIN) IVPB 1000 mg/200 mL premix  1,000 mg Intravenous Q24H Rexene Alberts, MD        Allergies as of 05/21/2016  . (No Known Allergies)    Family History  Problem Relation Age of Onset  . Colon cancer Neg Hx     Social History   Social History  . Marital status: Widowed    Spouse name: N/A  . Number of children: N/A  . Years of education: N/A   Occupational History  . Warehouse Supervisor    Social History Main Topics  . Smoking status: Former Smoker    Types: Cigarettes    Quit date: 07/09/1971  . Smokeless tobacco: Never Used  . Alcohol use No  . Drug use: No  . Sexual activity: Not on file   Other Topics Concern  . Not on file   Social History Narrative  . No narrative on file    Review of Systems: Gen: see HPI  CV: Denies chest pain, heart palpitations, syncope, edema  Resp: +SOB GI: see HPI  GU : Denies urinary burning, urinary frequency, urinary incontinence.  MS: chronic ankle swelling intermittently  Derm: Denies rash, itching, dry skin Psych: Denies depression, anxiety,confusion, or memory  loss Heme: Denies bruising, bleeding, and enlarged lymph nodes.  Physical Exam: Vital signs in last 24 hours: Temp:  [98.5 F (36.9 C)-102.1 F (38.9 C)] 98.9 F (37.2 C) (11/14 0639) Pulse Rate:  [83-112] 87 (11/14 1002) Resp:  [19-31] 22 (11/14 0639) BP: (83-132)/(39-76) 83/41 (11/14 1002) SpO2:  [92 %-100 %] 97 % (11/14 0639) Weight:  [220 lb (99.8 kg)-226 lb 13.7 oz (102.9 kg)] 226 lb 13.7 oz (102.9 kg) (11/14 7619)   General:   Alert, appears older than stated age, resting in bed  with flat affect. Slow to respond but answers questions appropriately. Per daughter, this is his baseline  Head:  Normocephalic and atraumatic. Ears:  Normal auditory acuity. Nose:  No deformity, discharge,  or lesions. Mouth:  No deformity or lesions, edentulous  Lungs:  Clear throughout to auscultation.    Heart:  S1 S2 present  Abdomen:  Soft, round, mild to moderate discomfort upper abdomen. Umbilical hernia noted Rectal:  Deferred  Extremities:  Trace ankle edema  Neurologic:  Alert and  oriented x4 Psych:  Alert and cooperative. Flat affect   Intake/Output from previous day: No intake/output data recorded. Intake/Output this shift: Total I/O In: 312.2 [P.O.:120; I.V.:142.2; IV Piggyback:50] Out: -   Lab Results:  Recent Labs  05/21/16 0137  WBC 15.0*  HGB 11.8*  HCT 36.3*  PLT 227   BMET  Recent Labs  05/21/16 0137  NA 132*  K 3.3*  CL 97*  CO2 24  GLUCOSE 135*  BUN 39*  CREATININE 1.65*  CALCIUM 8.3*   LFT  Recent Labs  05/21/16 0137  PROT 6.8  ALBUMIN 2.8*  AST 418*  ALT 220*  ALKPHOS 398*  BILITOT 4.2*    Studies/Results: Ct Abdomen Pelvis Wo Contrast  Result Date: 05/21/2016 CLINICAL DATA:  Acute onset of epigastric abdominal pain. Sinus tachycardia and fever. Renal insufficiency. Initial encounter. EXAM: CT ABDOMEN AND PELVIS WITHOUT CONTRAST TECHNIQUE: Multidetector CT imaging of the abdomen and pelvis was performed following the standard protocol  without IV contrast. COMPARISON:  None. FINDINGS: Lower chest: Minimal bibasilar atelectasis is noted. Diffuse coronary artery calcifications are seen. The patient is status post median sternotomy. Hepatobiliary: Diffuse soft tissue inflammation and wall thickening is noted about the gallbladder, with trace associated pericholecystic fluid, compatible with acute cholecystitis. A stone is noted within the gallbladder. The common bile duct remains normal in caliber. A nonspecific 1.4 cm hypodensity is noted at the left hepatic lobe. The liver is otherwise unremarkable. Pancreas: The pancreas is within normal limits. Spleen: The spleen is unremarkable in appearance. Adrenals/Urinary Tract: The adrenal glands are unremarkable in appearance. The kidneys are within normal limits. There is no evidence of hydronephrosis. No renal or ureteral stones are identified. No perinephric stranding is seen. Nonspecific perinephric stranding is noted bilaterally. Mild bilateral renal atrophy is seen. A small right renal cyst is noted. There is no evidence of hydronephrosis. No renal or ureteral stones are identified. Stomach/Bowel: The stomach is unremarkable in appearance. The small bowel is within normal limits. The appendix is normal in caliber, without evidence of appendicitis. Minimal diverticulosis is noted along the proximal sigmoid colon, without evidence of diverticulitis. Vascular/Lymphatic: Scattered calcification is seen along the abdominal aorta and its branches. The abdominal aorta is otherwise grossly unremarkable. The inferior vena cava is grossly unremarkable. No retroperitoneal lymphadenopathy is seen. No pelvic sidewall lymphadenopathy is identified. Reproductive: A focal diverticulum is noted arising at the dome of the bladder. The bladder is otherwise unremarkable. The prostate remains normal in size. Other: No additional soft tissue abnormalities are seen. Musculoskeletal: No acute osseous abnormalities are  identified. Mild vacuum phenomenon is noted at L4-L5. The visualized musculature is unremarkable in appearance. IMPRESSION: 1. Diffuse soft tissue inflammation and wall thickening at the gallbladder, with trace associated pericholecystic fluid, compatible with acute cholecystitis. Underlying cholelithiasis noted. 2. **An incidental finding of potential clinical significance has been found. Nonspecific 1.4 cm hypodensity at the left hepatic lobe. Would correlate with LFTs after completion of treatment for cholecystitis.** 3. Mild bilateral renal atrophy.  Small right renal cyst noted. 4. Minimal diverticulosis along the proximal sigmoid colon, without evidence of diverticulitis. 5. Scattered aortic atherosclerosis. 6. Focal bladder diverticulum noted. Bladder otherwise grossly unremarkable. Electronically Signed   By: Garald Balding M.D.   On: 05/21/2016 03:45   US Abdomen Complete  Result Date: 05/21/2016 CLINICAL DATA:  Abnormal CT.  Question cholecystitis. EXAM: ABDOMEN ULTRASOUND COMPLETE COMPARISON:  CT of earlier today FINDINGS: Gallbladder: Gallbladder sludge. Suspect a 12 mm stone. Gallbladder wall thickening at up to 11 mm. Small volume pericholecystic fluid. Sonographic Murphy's sign was not elicited. Common bile duct: Diameter: Upper normal for age, 8 mm. No intrahepatic ductal dilatation. Liver: Left hepatic lobe hyperechoic lesion is well-circumscribed and measures 1.1 cm. IVC: No abnormality visualized. Pancreas: Obscured by bowel gas. Spleen: Size and appearance within normal limits. Right Kidney: Length: 13.0 cm. No hydronephrosis. A lower pole right renal 4.8 cm lesion appears minimally complex, but is fluid density on the CT. An interpolar central right renal 1.5 cm hypoechoic lesion likely corresponds to a fluid density lesion on image 42 of today's CT. Left Kidney: Length: 11.4 cm. Echogenicity within normal limits. No mass or hydronephrosis visualized. Abdominal aorta: No aneurysm  visualized. Other findings: No ascites. IMPRESSION: 1. Gallbladder sludge with possible stone. Wall thickening and pericholecystic fluid are suspicious for acute cholecystitis. No sonographic Murphy sign to confirm acute cholecystitis. 2. Upper normal common duct size.  Correlate with bilirubin levels. 3. Right renal lesions are likely cysts given CT appearance. Recommend attention on follow-up. 4. Hyperechoic left hepatic lobe lesion is favored to represent a hemangioma. Technically indeterminate. Consider follow-up with ultrasound at 6 months to confirm size stability. Alternatively, outpatient pre and post contrast abdominal MRI could confirm a benign hemangioma. Electronically Signed   By: Abigail Miyamoto M.D.   On: 05/21/2016 09:02   Dg Chest Port 1 View  Result Date: 05/21/2016 CLINICAL DATA:  Epigastric pain.  Febrile. EXAM: PORTABLE CHEST 1 VIEW COMPARISON:  11/26/2011 FINDINGS: A single AP portable view of the chest demonstrates no focal airspace consolidation or alveolar edema. The lungs are grossly clear. There is no large effusion or pneumothorax. Cardiac and mediastinal contours appear unremarkable. IMPRESSION: No active disease. Electronically Signed   By: Andreas Newport M.D.   On: 05/21/2016 01:46    Impression: 82 year old male presenting with acute cholecystitis, elevated LFTs, US abdomen without definitive evidence of CBD stones but mild dilation noted. With pattern of LFTs and presentation will proceed with MRI/MRCP for further evaluation. Agree with  Antibiotic therapy. I discussed potential need for ERCP in future with patient and daughter if evidence of CBD stone. He is on heparin for DVT prophylaxis, which would need to be held. No other anticoagulation noted. Will proceed with MRI/MRCP first now.   Plan: MRI/MRCP Remain NPO Agree with antibiotics Fractionated bilirubin requested If need for ERCP, would need to hold heparin sucutaneous dosing  Annitta Needs,  ANP-BC [facility]    ADDENDUM: Hold on MRI/MRCP. Will recheck HFP in am. If further progression/no improvement, will proceed with MRI/MRCP.  Annitta Needs, ANP-BC [facility]    LOS: 0 days    05/21/2016, 10:56 AM

## 2016-05-21 NOTE — Progress Notes (Signed)
Pharmacy Antibiotic Note  Jesus Maldonado is a 82 y.o. male admitted on 05/21/2016 with sepsis.  Pharmacy has been consulted for [place] dosing.  SCr elevated.   Plan:  Vancomycin 1000mg  IV q24h Check trough at steady state Zosyn 3.375gm IV q8h, EID Monitor labs, renal fxn, progress and c/s [place] ABX when improved / appropriate.    Height: 5\' 9"  (175.3 cm) Weight: 226 lb 13.7 oz (102.9 kg) IBW/kg (Calculated) : 70.7  Temp (24hrs), Avg:100 F (37.8 C), Min:98.5 F (36.9 C), Max:102.1 F (38.9 C)   Recent Labs Lab 05/21/16 0137  WBC 15.0*  CREATININE 1.65*    Estimated Creatinine Clearance: 40.1 mL/min (by C-G formula based on SCr of 1.65 mg/dL (H)).    No Known Allergies  Antimicrobials this admission: Vancomycin 11/14 >>  Zosyn 11/14 >>   Dose adjustments this admission:  Microbiology results: 11/14 BCx: pending 11/14 UCx: pending   Sputum:    MRSA PCR:   Thank you for allowing pharmacy to be a part of this patient's care.  Hart Robinsons A 05/21/2016 12:06 PM

## 2016-05-21 NOTE — Consult Note (Signed)
Aware of consult.  Awaiting further workup and GI input.  Jesus Maldonado A 05/21/2016, 8:45 AM

## 2016-05-21 NOTE — ED Triage Notes (Signed)
Pt arrived by EMS from home. Called out for epigastric pain. Pt sinus tach & fever of 103.

## 2016-05-21 NOTE — ED Notes (Signed)
poc lactic acid result:  1.8

## 2016-05-21 NOTE — Care Management Note (Signed)
Case Management Note  Patient Details  Name: Jesus Maldonado MRN: [id number] Date of Birth: 19-Dec-1930  Subjective/Objective:                  Pt admitted with cholecystits. He is from home, lives with his daughter part of the time here in [place] and part of the time with a daughter in [place]. Pt is ind with ADL's and has no DME PTA. Pt's daughter is at the bedside. He has PCP care in both locations and has no HH services. Pt/daughter plan on pt returning home with self care at [place]. He is supposed to be flying back to [facility] at the end of the month.   Action/Plan: No CM needs anticipated, will cont to follow.   Expected Discharge Date:    05/27/2016              Expected Discharge Plan:  Home/Self Care  In-House Referral:  NA  Discharge planning Services  CM Consult  Post Acute Care Choice:  NA Choice offered to:  NA  Status of Service:  In process, will continue to follow Sherald Barge, RN 05/21/2016, 2:26 PM

## 2016-05-21 NOTE — Progress Notes (Signed)
Patient is an 83 year old man with a history of CAD/CABG, chronic A. fib-not on anticoagulation, and Parkinson's disease, who was admitted this morning by Dr. Marin Comment for radiographic evidence of acute cholecystitis. Patient was briefly seen and examined. His chart, vital signs, and laboratory studies were reviewed. Agree with current management with additions below.  -We'll add potassium to the IV fluids to treat hypokalemia. Will increase to rate of the IV fluids due to soft blood pressures. -We'll decrease metoprolol to 12.5 mg twice a day due to soft blood pressures. We'll continue to hold ramipril. -Abdominal ultrasound revealed gallbladder sludge with possible stone and findings suspicious for acute cholecystitis and hyperechoic left hepatic lobe lesion favored to be hemangioma. -GI consulted. If further progression or no improvement, they recommend ordering an MRI/MRCP. -Gen. surgery consult pending. -2-D echocardiogram pending. If cholecystectomy is needed, consider cardiology consultation for preop clearance, pending the results of the 2-D echo.

## 2016-05-21 NOTE — H&P (Addendum)
History and Physical    Jesus Maldonado [id number] DOB: 05/18/1931 DOA: 05/21/2016  PCP: Kendrick Ranch, MD  Patient coming from: Home   Chief Complaint:   Abdominal pain.   HPI: Jesus Maldonado is an 82 y.o. male with hx of CAD, afib post CABGx4 not on anticoagulation, HTN, HLD, parkinson disease, presented to the ER with abdominal pain for several days.  He has no CP, SOB, nausea or vomiting.  Evaluation in the ER with abdominal CT revealed evidence of acute cholecystitis, cholelithiasis, but normal caliber CBD.  Incidentally, he was found to have a 1.4cm hypodense lesion on his liver (daughter was told).  His liver Fx tests showed AST 400's ALT 200's AlkPHO 400 total bili 4.  He has a leukocytosis with WBC of 15K.  His Cr was slightly elevated at 1.6.  EDP consulted Dr Arnoldo Morale of general surgery, who will see him in consultation, suggested GI consultation as well, and hospitalist was asked to admit him for acute cholecystitis, cholelithiaisis, r/out obstructive biliary disease.   ED Course:  See above.  Rewiew of Systems:  Constitutional: Negative for malaise, fever and chills. No significant weight loss or weight gain Eyes: Negative for eye pain, redness and discharge, diplopia, visual changes, or flashes of light. ENMT: Negative for ear pain, hoarseness, nasal congestion, sinus pressure and sore throat. No headaches; tinnitus, drooling, or problem swallowing. Cardiovascular: Negative for chest pain, palpitations, diaphoresis, dyspnea and peripheral edema. ; No orthopnea, PND Respiratory: Negative for cough, hemoptysis, wheezing and stridor. No pleuritic chestpain. Gastrointestinal: Negative for diarrhea, constipation,  melena, blood in stool, hematemesis,and rectal bleeding.    Genitourinary: Negative for frequency, dysuria, incontinence,flank pain and hematuria; Musculoskeletal: Negative for back pain and neck pain. Negative for swelling and trauma.;  Skin: . Negative for  pruritus, rash, abrasions, bruising and skin lesion.; ulcerations Neuro: Negative for headache, lightheadedness and neck stiffness. Negative for weakness, altered level of consciousness , altered mental status, extremity weakness, burning feet, involuntary movement, seizure and syncope.  Psych: negative for anxiety, depression, insomnia, tearfulness, panic attacks, hallucinations, paranoia, suicidal or homicidal ideation    Past Medical History:  Diagnosis Date  . Arthritis   . Coronary artery disease    s/p PCI 10 yrs ago.  Marland Kitchen Hypercholesteremia   . Hypertension   . Myocardial infarct     Past Surgical History:  Procedure Laterality Date  . cardiac stents    . CORONARY ARTERY BYPASS GRAFT  11/01/2011   Procedure: CORONARY ARTERY BYPASS GRAFTING (CABG);  Surgeon: Melrose Nakayama, MD;  Location: [place];  Service: Open Heart Surgery;  Laterality: N/A;  Coronary Artery bypass graft times four utilizing the left internal mammary artery and the right greater saphenous vein harvested endoscopically.  Marland Kitchen HEMORROIDECTOMY    . LEFT HEART CATHETERIZATION WITH CORONARY ANGIOGRAM N/A 10/28/2011   Procedure: LEFT HEART CATHETERIZATION WITH CORONARY ANGIOGRAM;  Surgeon: Burnell Blanks, MD;  Location: [facility] C[facility] LAB;  Service: Cardiovascular;  Laterality: N/A;  . PERCUTANEOUS CORONARY INTERVENTION-BALLOON ONLY  10/28/2011   Procedure: PERCUTANEOUS CORONARY INTERVENTION-BALLOON ONLY;  Surgeon: Burnell Blanks, MD;  Location: [facility] CATH LAB;  Service: Cardiovascular;;  . STERNAL INCISION RECLOSURE  11/06/2011   Procedure: STERNAL REWIRING;  Surgeon: Melrose Nakayama, MD;  Location: [place];  Service: Open Heart Surgery;  Laterality: N/A;     reports that he quit smoking about 44 years ago. His smoking use included Cigarettes. He has never used smokeless tobacco. He reports that he does not  drink alcohol or use drugs.  No Known Allergies  History reviewed. No pertinent family  history.   Prior to Admission medications   Medication Sig Start Date End Date Taking? Authorizing Provider  aspirin 81 MG chewable tablet Chew 81 mg by mouth daily.   Yes Historical Provider, MD  atorvastatin (LIPITOR) 40 MG tablet Take 40 mg by mouth at bedtime.   Yes Historical Provider, MD  carbidopa-levodopa (SINEMET IR) 10-100 MG tablet Take 1 tablet by mouth 3 (three) times daily.   Yes Historical Provider, MD  metoprolol tartrate (LOPRESSOR) 50 MG tablet Take 1 tablet (50 mg total) by mouth 2 (two) times daily. 11/08/11  Yes Donielle Liston Alba, PA-C  omeprazole (PRILOSEC) 20 MG capsule Take 20 mg by mouth daily.   Yes Historical Provider, MD  ramipril (ALTACE) 2.5 MG capsule Take 2.5 mg by mouth daily.   Yes Historical Provider, MD  aspirin 325 MG tablet Take 81 mg by mouth daily.     Historical Provider, MD  doxylamine, Sleep, (UNISOM) 25 MG tablet Take 25 mg by mouth at bedtime as needed.    Historical Provider, MD    Physical Exam: Vitals:   05/21/16 0356 05/21/16 0400 05/21/16 0415 05/21/16 0430  BP:  132/64 132/76 103/62  Pulse:  99 103 102  Resp:  (!) 30 (!) 30 (!) 28  Temp: 100.6 F (38.1 C)     TempSrc: Rectal     SpO2:  100% 99% 98%  Weight:      Height:          Constitutional: NAD, calm, comfortable Vitals:   05/21/16 0356 05/21/16 0400 05/21/16 0415 05/21/16 0430  BP:  132/64 132/76 103/62  Pulse:  99 103 102  Resp:  (!) 30 (!) 30 (!) 28  Temp: 100.6 F (38.1 C)     TempSrc: Rectal     SpO2:  100% 99% 98%  Weight:      Height:       Eyes: PERRL, lids and conjunctivae normal ENMT: Mucous membranes are moist. Posterior pharynx clear of any exudate or lesions.Normal dentition.  Neck: normal, supple, no masses, no thyromegaly Respiratory: clear to auscultation bilaterally, no wheezing, no crackles. Normal respiratory effort. No accessory muscle use.  Cardiovascular: Regular rate and rhythm, no murmurs / rubs / gallops. No extremity edema. 2+ pedal  pulses. No carotid bruits.  Abdomen: no tenderness, no masses palpated. No hepatosplenomegaly. Bowel sounds positive. Obese abdomen.  Musculoskeletal: no clubbing / cyanosis. No joint deformity upper and lower extremities. Good ROM, no contractures. Normal muscle tone.  Skin: no rashes, lesions, ulcers. No induration Neurologic: CN 2-12 grossly intact. Sensation intact, DTR normal. Strength 5/5 in all 4.  Psychiatric: Normal judgment and insight. Alert and oriented x 3. Normal mood.   Labs on Admission: I have personally reviewed following labs and imaging studies  CBC:  Recent Labs Lab 05/21/16 0137  WBC 15.0*  NEUTROABS 13.5*  HGB 11.8*  HCT 36.3*  MCV 75.2*  PLT [id number]   Basic Metabolic Panel:  Recent Labs Lab 05/21/16 0137  NA 132*  K 3.3*  CL 97*  CO2 24  GLUCOSE 135*  BUN 39*  CREATININE 1.65*  CALCIUM 8.3*   GFR: Estimated Creatinine Clearance: 42.2 mL/min (by C-G formula based on SCr of 1.65 mg/dL (H)). Liver Function Tests:  Recent Labs Lab 05/21/16 0137  AST 418*  ALT 220*  ALKPHOS 398*  BILITOT 4.2*  PROT 6.8  ALBUMIN 2.8*    Recent  Labs Lab 05/21/16 0137  LIPASE 31   Urine analysis:    Component Value Date/Time   COLORURINE AMBER (A) 05/21/2016 0351   APPEARANCEUR CLEAR 05/21/2016 0351   LABSPEC 1.025 05/21/2016 0351   PHURINE 5.5 05/21/2016 0351   GLUCOSEU NEGATIVE 05/21/2016 0351   HGBUR SMALL (A) 05/21/2016 0351   BILIRUBINUR SMALL (A) 05/21/2016 0351   KETONESUR NEGATIVE 05/21/2016 0351   PROTEINUR 30 (A) 05/21/2016 0351   UROBILINOGEN 0.2 11/05/2011 2233   NITRITE NEGATIVE 05/21/2016 0351   LEUKOCYTESUR NEGATIVE 05/21/2016 0351   Radiological Exams on Admission: Ct Abdomen Pelvis Wo Contrast  Result Date: 05/21/2016 CLINICAL DATA:  Acute onset of epigastric abdominal pain. Sinus tachycardia and fever. Renal insufficiency. Initial encounter. EXAM: CT ABDOMEN AND PELVIS WITHOUT CONTRAST TECHNIQUE: Multidetector CT imaging of the  abdomen and pelvis was performed following the standard protocol without IV contrast. COMPARISON:  None. FINDINGS: Lower chest: Minimal bibasilar atelectasis is noted. Diffuse coronary artery calcifications are seen. The patient is status post median sternotomy. Hepatobiliary: Diffuse soft tissue inflammation and wall thickening is noted about the gallbladder, with trace associated pericholecystic fluid, compatible with acute cholecystitis. A stone is noted within the gallbladder. The common bile duct remains normal in caliber. A nonspecific 1.4 cm hypodensity is noted at the left hepatic lobe. The liver is otherwise unremarkable. Pancreas: The pancreas is within normal limits. Spleen: The spleen is unremarkable in appearance. Adrenals/Urinary Tract: The adrenal glands are unremarkable in appearance. The kidneys are within normal limits. There is no evidence of hydronephrosis. No renal or ureteral stones are identified. No perinephric stranding is seen. Nonspecific perinephric stranding is noted bilaterally. Mild bilateral renal atrophy is seen. A small right renal cyst is noted. There is no evidence of hydronephrosis. No renal or ureteral stones are identified. Stomach/Bowel: The stomach is unremarkable in appearance. The small bowel is within normal limits. The appendix is normal in caliber, without evidence of appendicitis. Minimal diverticulosis is noted along the proximal sigmoid colon, without evidence of diverticulitis. Vascular/Lymphatic: Scattered calcification is seen along the abdominal aorta and its branches. The abdominal aorta is otherwise grossly unremarkable. The inferior vena cava is grossly unremarkable. No retroperitoneal lymphadenopathy is seen. No pelvic sidewall lymphadenopathy is identified. Reproductive: A focal diverticulum is noted arising at the dome of the bladder. The bladder is otherwise unremarkable. The prostate remains normal in size. Other: No additional soft tissue abnormalities  are seen. Musculoskeletal: No acute osseous abnormalities are identified. Mild vacuum phenomenon is noted at L4-L5. The visualized musculature is unremarkable in appearance. IMPRESSION: 1. Diffuse soft tissue inflammation and wall thickening at the gallbladder, with trace associated pericholecystic fluid, compatible with acute cholecystitis. Underlying cholelithiasis noted. 2. **An incidental finding of potential clinical significance has been found. Nonspecific 1.4 cm hypodensity at the left hepatic lobe. Would correlate with LFTs after completion of treatment for cholecystitis.** 3. Mild bilateral renal atrophy.  Small right renal cyst noted. 4. Minimal diverticulosis along the proximal sigmoid colon, without evidence of diverticulitis. 5. Scattered aortic atherosclerosis. 6. Focal bladder diverticulum noted. Bladder otherwise grossly unremarkable. Electronically Signed   By: Garald Balding M.D.   On: 05/21/2016 03:45   Dg Chest Port 1 View  Result Date: 05/21/2016 CLINICAL DATA:  Epigastric pain.  Febrile. EXAM: PORTABLE CHEST 1 VIEW COMPARISON:  11/26/2011 FINDINGS: A single AP portable view of the chest demonstrates no focal airspace consolidation or alveolar edema. The lungs are grossly clear. There is no large effusion or pneumothorax. Cardiac and mediastinal contours appear  unremarkable. IMPRESSION: No active disease. Electronically Signed   By: Andreas Newport M.D.   On: 05/21/2016 01:46    EKG: Independently reviewed.  Assessment/Plan Principal Problem:   Acute cholecystitis Active Problems:   HTN (hypertension)   Hyperlipidemia with target low density lipoprotein (LDL) cholesterol less than 70 mg/dL   CAD (coronary artery disease)   Atrial fibrillation (HCC)   Cholelithiasis    PLAN:   Acute cholecystitis:  Will continue with broad spec antibiotics.  NPO, IVF, and IV analgesic PRN.   Awaiting surgical and GI consultation.  He is stable.   Obtain US of the abdomen in the am.     HTN:  Will hold his Ramipril, as his BP is soft.  Continue his BB.  Hx of Afib:  Post op, not anticoagulated.  Poor risk for long term any how due to fall risk having PD.  Rate is controlled.  Continue with BB.  Hx of CAD:  If he requires surgery, would strongly consider preOP clearance by cardiology in this patient.  I will obtain an ECHO early on.  Continue with ASA.  Will continue with his statin at this time.    DVT prophylaxis: SQ Hep Code Status: FULL CODE CONFIRMED TONIGHT. Family Communication: daughter and son in laws at bedside.  Disposition Plan:  Home when appropriate.  Consults called: Surgery.  Admission status: Inpatient.    Malahki Gasaway MD FACP. [facility]  If 7PM-7AM, please contact night-coverage [email or web address] Password [email or web address]  05/21/2016, 5:15 AM

## 2016-05-21 NOTE — ED Notes (Signed)
poc troponin 0.0

## 2016-05-21 NOTE — Progress Notes (Signed)
Dr. Caryn Section made aware of positive blood cultures gram (-) rods in both aerobic bottles.  Patient was having urinary retention, unable to urinate, bladder scan done > 200 urine, Dr. Caryn Section notified, orders to insert foley catheter.

## 2016-05-21 NOTE — ED Provider Notes (Signed)
[facility] DEPT Provider Note   CSN: [id number] Arrival date & time: 05/21/16  0117     History   Chief Complaint Chief Complaint  Patient presents with  . Abdominal Pain    HPI Jesus Maldonado is a 82 y.o. male.  HPI  This is an 82 year old male with a history of coronary artery disease who presents with chest and abdominal pain. Patient brought in by EMS. Patient reports chest and upper abdominal pain. Noted by EMS to have a fever. Does report a cough. Denies any shortness of breath. Denies any nausea, vomiting, or diarrhea. Patient states "I feel weak." Denies any urinary symptoms. Unable to quantify pain.  Per patient he is FULL CODE.  Level V caveat for acuity of condition.  4:17 AM Family now the bedside. Report patient complained of abdominal pain and constipation this weekend. He had worsening abdominal pain tonight. No noted fevers at home. Was given a laxative with improvement of his constipation this weekend.  Past Medical History:  Diagnosis Date  . Arthritis   . Coronary artery disease    s/p PCI 10 yrs ago.  Marland Kitchen Hypercholesteremia   . Hypertension   . Myocardial infarct     Patient Active Problem List   Diagnosis Date Noted  . Lower extremity edema 02/11/2012  . CAD (coronary artery disease) 12/09/2011  . Atrial fibrillation ([place]) 12/09/2011  . HTN (hypertension) 10/26/2011  . Hyperlipidemia LDL goal < 70 10/26/2011  . NSTEMI (non-ST elevated myocardial infarction) ([place]) 10/25/2011    Past Surgical History:  Procedure Laterality Date  . cardiac stents    . CORONARY ARTERY BYPASS GRAFT  11/01/2011   Procedure: CORONARY ARTERY BYPASS GRAFTING (CABG);  Surgeon: Melrose Nakayama, MD;  Location: [facility];  Service: Open Heart Surgery;  Laterality: N/A;  Coronary Artery bypass graft times four utilizing the left internal mammary artery and the right greater saphenous vein harvested endoscopically.  Marland Kitchen HEMORROIDECTOMY    . LEFT HEART CATHETERIZATION WITH  CORONARY ANGIOGRAM N/A 10/28/2011   Procedure: LEFT HEART CATHETERIZATION WITH CORONARY ANGIOGRAM;  Surgeon: Burnell Blanks, MD;  Location[facility] CATH LAB;  Service: Cardiovascular;  Laterality: N/A;  . PERCUTANEOUS CORONARY INTERVENTION-BALLOON ONLY  10/28/2011   Procedure: PERCUTANEOUS CORONARY INTERVENTION-BALLOON ONLY;  Surgeon: Burnell Blanks, MD;  Location: [facility] C[facility] LAB;  Service: Cardiovascular;;  . STERNAL INCISION RECLOSURE  11/06/2011   Procedure: STERNAL REWIRING;  Surgeon: Melrose Nakayama, MD;  Location: [facility];  Service: Open Heart Surgery;  Laterality: N/A;       Home Medications    Prior to Admission medications   Medication Sig Start Date End Date Taking? Authorizing Provider  aspirin 81 MG chewable tablet Chew 81 mg by mouth daily.   Yes Historical Provider, MD  atorvastatin (LIPITOR) 40 MG tablet Take 40 mg by mouth at bedtime.   Yes Historical Provider, MD  carbidopa-levodopa (SINEMET IR) 10-100 MG tablet Take 1 tablet by mouth 3 (three) times daily.   Yes Historical Provider, MD  metoprolol tartrate (LOPRESSOR) 50 MG tablet Take 1 tablet (50 mg total) by mouth 2 (two) times daily. 11/08/11  Yes Donielle Liston Alba, PA-C  omeprazole (PRILOSEC) 20 MG capsule Take 20 mg by mouth daily.   Yes Historical Provider, MD  ramipril (ALTACE) 2.5 MG capsule Take 2.5 mg by mouth daily.   Yes Historical Provider, MD  aspirin 325 MG tablet Take 81 mg by mouth daily.     Historical Provider, MD  doxylamine, Sleep, Drema Balzarine)  25 MG tablet Take 25 mg by mouth at bedtime as needed.    Historical Provider, MD    Family History History reviewed. No pertinent family history.  Social History Social History  Substance Use Topics  . Smoking status: Former Smoker    Types: Cigarettes    Quit date: 07/09/1971  . Smokeless tobacco: Never Used  . Alcohol use No     Allergies   Patient has no known allergies.   Review of Systems Review of Systems  Constitutional: Positive  for fever.  Respiratory: Positive for cough. Negative for shortness of breath.   Cardiovascular: Positive for chest pain.  Gastrointestinal: Positive for abdominal pain. Negative for diarrhea, nausea and vomiting.  Genitourinary: Negative for dysuria.  Neurological: Positive for weakness.  All other systems reviewed and are negative.    Physical Exam Updated Vital Signs BP 132/76   Pulse 103   Temp 100.6 F (38.1 C) (Rectal)   Resp (!) 30   Ht 6\' 1"  (1.854 m)   Wt 220 lb (99.8 kg)   SpO2 99%   BMI 29.03 kg/m   Physical Exam  Constitutional: He is oriented to person, place, and time. No distress.  Ill-appearing but nontoxic, mild jaundice  HENT:  Head: Normocephalic and atraumatic.  Eyes: Pupils are equal, round, and reactive to light.  Cardiovascular: Regular rhythm and normal heart sounds.   No murmur heard. Tachycardia  Pulmonary/Chest: Effort normal and breath sounds normal. No respiratory distress. He has no wheezes.  Decreased breath sounds right lung throughout, no wheezing  Abdominal: Soft. Bowel sounds are normal. There is tenderness. There is no rebound.  Epigastric tenderness to palpation without rebound or guarding  Musculoskeletal: He exhibits edema.  Trace bilateral lower extremity edema  Neurological: He is alert and oriented to person, place, and time.  Skin: Skin is warm and dry.  Psychiatric: He has a normal mood and affect.  Nursing note and vitals reviewed.    ED Treatments / Results  Labs (all labs ordered are listed, but only abnormal results are displayed) Labs Reviewed  COMPREHENSIVE METABOLIC PANEL - Abnormal; Notable for the following:       Result Value   Sodium 132 (*)    Potassium 3.3 (*)    Chloride 97 (*)    Glucose, Bld 135 (*)    BUN 39 (*)    Creatinine, Ser 1.65 (*)    Calcium 8.3 (*)    Albumin 2.8 (*)    AST 418 (*)    ALT 220 (*)    Alkaline Phosphatase 398 (*)    Total Bilirubin 4.2 (*)    GFR calc non Af Amer 37 (*)     GFR calc Af Amer 43 (*)    All other components within normal limits  CBC WITH DIFFERENTIAL/PLATELET - Abnormal; Notable for the following:    WBC 15.0 (*)    Hemoglobin 11.8 (*)    HCT 36.3 (*)    MCV 75.2 (*)    MCH 24.4 (*)    RDW 16.4 (*)    Neutro Abs 13.5 (*)    Lymphs Abs 0.6 (*)    All other components within normal limits  CULTURE, BLOOD (ROUTINE X 2)  CULTURE, BLOOD (ROUTINE X 2)  URINE CULTURE  LIPASE, BLOOD  URINALYSIS, ROUTINE W REFLEX MICROSCOPIC (NOT AT [facility])  I-STAT CG4 LACTIC ACID, ED  I-STAT TROPOININ, ED  I-STAT CG4 LACTIC ACID, ED    EKG  EKG Interpretation  Date/Time:  Tuesday May 21 2016 01:34:56 EST Ventricular Rate:  109 PR Interval:    QRS Duration: 86 QT Interval:  328 QTC Calculation: 442 R Axis:   41 Text Interpretation:  Sinus tachycardia Multiple ventricular premature complexes Confirmed by Dina Rich  MD, COURTNEY ([id number]) on 05/21/2016 2:02:54 AM       Radiology Ct Abdomen Pelvis Wo Contrast  Result Date: 05/21/2016 CLINICAL DATA:  Acute onset of epigastric abdominal pain. Sinus tachycardia and fever. Renal insufficiency. Initial encounter. EXAM: CT ABDOMEN AND PELVIS WITHOUT CONTRAST TECHNIQUE: Multidetector CT imaging of the abdomen and pelvis was performed following the standard protocol without IV contrast. COMPARISON:  None. FINDINGS: Lower chest: Minimal bibasilar atelectasis is noted. Diffuse coronary artery calcifications are seen. The patient is status post median sternotomy. Hepatobiliary: Diffuse soft tissue inflammation and wall thickening is noted about the gallbladder, with trace associated pericholecystic fluid, compatible with acute cholecystitis. A stone is noted within the gallbladder. The common bile duct remains normal in caliber. A nonspecific 1.4 cm hypodensity is noted at the left hepatic lobe. The liver is otherwise unremarkable. Pancreas: The pancreas is within normal limits. Spleen: The spleen is unremarkable in  appearance. Adrenals/Urinary Tract: The adrenal glands are unremarkable in appearance. The kidneys are within normal limits. There is no evidence of hydronephrosis. No renal or ureteral stones are identified. No perinephric stranding is seen. Nonspecific perinephric stranding is noted bilaterally. Mild bilateral renal atrophy is seen. A small right renal cyst is noted. There is no evidence of hydronephrosis. No renal or ureteral stones are identified. Stomach/Bowel: The stomach is unremarkable in appearance. The small bowel is within normal limits. The appendix is normal in caliber, without evidence of appendicitis. Minimal diverticulosis is noted along the proximal sigmoid colon, without evidence of diverticulitis. Vascular/Lymphatic: Scattered calcification is seen along the abdominal aorta and its branches. The abdominal aorta is otherwise grossly unremarkable. The inferior vena cava is grossly unremarkable. No retroperitoneal lymphadenopathy is seen. No pelvic sidewall lymphadenopathy is identified. Reproductive: A focal diverticulum is noted arising at the dome of the bladder. The bladder is otherwise unremarkable. The prostate remains normal in size. Other: No additional soft tissue abnormalities are seen. Musculoskeletal: No acute osseous abnormalities are identified. Mild vacuum phenomenon is noted at L4-L5. The visualized musculature is unremarkable in appearance. IMPRESSION: 1. Diffuse soft tissue inflammation and wall thickening at the gallbladder, with trace associated pericholecystic fluid, compatible with acute cholecystitis. Underlying cholelithiasis noted. 2. **An incidental finding of potential clinical significance has been found. Nonspecific 1.4 cm hypodensity at the left hepatic lobe. Would correlate with LFTs after completion of treatment for cholecystitis.** 3. Mild bilateral renal atrophy.  Small right renal cyst noted. 4. Minimal diverticulosis along the proximal sigmoid colon, without  evidence of diverticulitis. 5. Scattered aortic atherosclerosis. 6. Focal bladder diverticulum noted. Bladder otherwise grossly unremarkable. Electronically Signed   By: Garald Balding M.D.   On: 05/21/2016 03:45   Dg Chest Port 1 View  Result Date: 05/21/2016 CLINICAL DATA:  Epigastric pain.  Febrile. EXAM: PORTABLE CHEST 1 VIEW COMPARISON:  11/26/2011 FINDINGS: A single AP portable view of the chest demonstrates no focal airspace consolidation or alveolar edema. The lungs are grossly clear. There is no large effusion or pneumothorax. Cardiac and mediastinal contours appear unremarkable. IMPRESSION: No active disease. Electronically Signed   By: Andreas Newport M.D.   On: 05/21/2016 01:46    Procedures Procedures (including critical care time)  CRITICAL CARE Performed by: Merryl Hacker   Total critical care time:  40 minutes  Critical care time was exclusive of separately billable procedures and treating other patients.  Critical care was necessary to treat or prevent imminent or life-threatening deterioration.  Critical care was time spent personally by me on the following activities: development of treatment plan with patient and/or surrogate as well as nursing, discussions with consultants, evaluation of patient's response to treatment, examination of patient, obtaining history from patient or surrogate, ordering and performing treatments and interventions, ordering and review of laboratory studies, ordering and review of radiographic studies, pulse oximetry and re-evaluation of patient's condition.   Medications Ordered in ED Medications  acetaminophen (TYLENOL) suppository 650 mg (650 mg Rectal Given 05/21/16 0212)  iopamidol (ISOVUE-300) 61 % injection (not administered)  metroNIDAZOLE (FLAGYL) IVPB 500 mg (500 mg Intravenous New Bag/Given 05/21/16 0337)  sodium chloride 0.9 % bolus 1,000 mL (0 mLs Intravenous Stopped 05/21/16 0217)    And  sodium chloride 0.9 % bolus  1,000 mL (0 mLs Intravenous Stopped 05/21/16 0300)    And  sodium chloride 0.9 % bolus 1,000 mL (0 mLs Intravenous Stopped 05/21/16 0300)  piperacillin-tazobactam (ZOSYN) IVPB 3.375 g (0 g Intravenous Stopped 05/21/16 0218)  vancomycin (VANCOCIN) IVPB 1000 mg/200 mL premix (0 mg Intravenous Stopped 05/21/16 0300)  ondansetron (ZOFRAN) injection 4 mg (4 mg Intravenous Given 05/21/16 0408)  morphine 4 MG/ML injection 4 mg (4 mg Intravenous Given 05/21/16 0410)     Initial Impression / Assessment and Plan / ED Course  I have reviewed the triage vital signs and the nursing notes.  Pertinent labs & imaging results that were available during my care of the patient were reviewed by me and considered in my medical decision making (see chart for details).  Clinical Course as of May 21 416  Tue May 21, 2016  [id number] Lab work notable for leukocytosis, normal lactate, elevated LFTs and bilirubin. Given clinical presentation, would be concerned for ascending cholangitis. Flagyl added to antibiotic coverage. Do not have ultrasound at this time. Will obtain CT.  [CH]    Clinical Course User Index [CH] Merryl Hacker, MD    Patient presents with chest pain and abdominal pain. Found to have a Fever. Vital signs notable for temperature of 102.1, heart rate 112, BP 108/53. He is ill-appearing but nontoxic. Decreased breath sounds on the right. Sepsis workup an protocol initiated. Patient initiated on 3 L of fluid given blood pressure of 108/53 which is likely low for him given his history of hypertension. He was given broad-spectrum antibiotics.  See clinical course above. CT scan shows acute cholecystitis. No mention of ductal dilation or ductal stone. This is not the most sensitive test. Discussed with Dr. Arnoldo Morale, general surgery. He will evaluate the patient and the morning. He recommends hospitalist admission and ultrasound in the morning with possible GI involvement. This was discussed with Dr. Marin Comment who  will admit the patient.  Final Clinical Impressions(s) / ED Diagnoses   Final diagnoses:  Acute cholecystitis  Sepsis, due to unspecified organism [facility])    New Prescriptions New Prescriptions   No medications on file     Merryl Hacker, MD 05/21/16 [phone]

## 2016-05-21 NOTE — ED Notes (Signed)
Blanket given to patient by MD

## 2016-05-22 ENCOUNTER — Inpatient Hospital Stay (HOSPITAL_COMMUNITY): Payer: Medicare PPO

## 2016-05-22 DIAGNOSIS — I4891 Unspecified atrial fibrillation: Secondary | ICD-10-CM

## 2016-05-22 DIAGNOSIS — B9620 Unspecified Escherichia coli [E. coli] as the cause of diseases classified elsewhere: Secondary | ICD-10-CM | POA: Diagnosis present

## 2016-05-22 DIAGNOSIS — N179 Acute kidney failure, unspecified: Secondary | ICD-10-CM

## 2016-05-22 DIAGNOSIS — A419 Sepsis, unspecified organism: Secondary | ICD-10-CM

## 2016-05-22 DIAGNOSIS — R7881 Bacteremia: Secondary | ICD-10-CM

## 2016-05-22 LAB — BLOOD CULTURE ID PANEL (REFLEXED)

## 2016-05-22 LAB — BASIC METABOLIC PANEL
Anion gap: 8 (ref 5–15)
BUN: 26 mg/dL — ABNORMAL HIGH (ref 6–20)
CHLORIDE: 106 mmol/L (ref 101–111)
CO2: 22 mmol/L (ref 22–32)
Calcium: 7.9 mg/dL — ABNORMAL LOW (ref 8.9–10.3)
Creatinine, Ser: 1.31 mg/dL — ABNORMAL HIGH (ref 0.61–1.24)
GFR calc non Af Amer: 49 mL/min — ABNORMAL LOW (ref >60)
GFR, EST AFRICAN AMERICAN: 56 mL/min — AB (ref >60)
Glucose, Bld: 88 mg/dL (ref 65–99)
POTASSIUM: 3.9 mmol/L (ref 3.5–5.1)
SODIUM: 136 mmol/L (ref 135–145)

## 2016-05-22 LAB — HEPATIC FUNCTION PANEL
ALT: 54 U/L (ref 17–63)
AST: 199 U/L — ABNORMAL HIGH (ref 15–41)
Albumin: 2.2 g/dL — ABNORMAL LOW (ref 3.5–5.0)
Alkaline Phosphatase: 326 U/L — ABNORMAL HIGH (ref 38–126)
Bilirubin, Direct: 2.2 mg/dL — ABNORMAL HIGH (ref 0.1–0.5)
Indirect Bilirubin: 1.4 mg/dL — ABNORMAL HIGH (ref 0.3–0.9)
Total Bilirubin: 3.6 mg/dL — ABNORMAL HIGH (ref 0.3–1.2)
Total Protein: 5.7 g/dL — ABNORMAL LOW (ref 6.5–8.1)

## 2016-05-22 LAB — CBC
HCT: 34.1 % — ABNORMAL LOW (ref 39.0–52.0)
Hemoglobin: 10.6 g/dL — ABNORMAL LOW (ref 13.0–17.0)
MCH: 23.7 pg — ABNORMAL LOW (ref 26.0–34.0)
MCHC: 31.1 g/dL (ref 30.0–36.0)
MCV: 76.3 fL — ABNORMAL LOW (ref 78.0–100.0)
Platelets: 226 10*3/uL (ref 150–400)
RBC: 4.47 MIL/uL (ref 4.22–5.81)
RDW: 17.1 % — ABNORMAL HIGH (ref 11.5–15.5)
WBC: 18.7 10*3/uL — ABNORMAL HIGH (ref 4.0–10.5)

## 2016-05-22 LAB — URINE CULTURE: CULTURE: NO GROWTH

## 2016-05-22 NOTE — Progress Notes (Signed)
PHARMACY - PHYSICIAN COMMUNICATION CRITICAL VALUE ALERT - BLOOD CULTURE IDENTIFICATION (BCID)  Results for orders placed or performed during the hospital encounter of 05/21/16  Blood Culture ID Panel (Reflexed) (Collected: 05/21/2016  1:38 AM)  Result Value Ref Range   Enterococcus species NOT DETECTED NOT DETECTED   Listeria monocytogenes NOT DETECTED NOT DETECTED   Staphylococcus species NOT DETECTED NOT DETECTED   Staphylococcus aureus NOT DETECTED NOT DETECTED   Streptococcus species NOT DETECTED NOT DETECTED   Streptococcus agalactiae NOT DETECTED NOT DETECTED   Streptococcus pneumoniae NOT DETECTED NOT DETECTED   Streptococcus pyogenes NOT DETECTED NOT DETECTED   Acinetobacter baumannii NOT DETECTED NOT DETECTED   Enterobacteriaceae species DETECTED (A) NOT DETECTED   Enterobacter cloacae complex NOT DETECTED NOT DETECTED   Escherichia coli DETECTED (A) NOT DETECTED   Klebsiella oxytoca NOT DETECTED NOT DETECTED   Klebsiella pneumoniae NOT DETECTED NOT DETECTED   Proteus species NOT DETECTED NOT DETECTED   Serratia marcescens NOT DETECTED NOT DETECTED   Carbapenem resistance NOT DETECTED NOT DETECTED   Haemophilus influenzae NOT DETECTED NOT DETECTED   Neisseria meningitidis NOT DETECTED NOT DETECTED   Pseudomonas aeruginosa NOT DETECTED NOT DETECTED   Candida albicans NOT DETECTED NOT DETECTED   Candida glabrata NOT DETECTED NOT DETECTED   Candida krusei NOT DETECTED NOT DETECTED   Candida parapsilosis NOT DETECTED NOT DETECTED   Candida tropicalis NOT DETECTED NOT DETECTED   Name of physician (or Provider) Contacted: Dr Roderic Palau  Changes to prescribed antibiotics required: Continue current Rx pending finalized cx's, sensitivities  Hart Robinsons A 05/22/2016  7:55 AM

## 2016-05-22 NOTE — Progress Notes (Signed)
PROGRESS NOTE    Jesus Maldonado  [id number] DOB: [birth year] DOA: 05/21/2016 PCP: Kendrick Ranch, MD    Brief Narrative:  38 yom with a hx of CAD s/p CABG x4. A-fib not on any anticoagulation, HTN, HLD, and parkinson disease, presented with complaints of abdominal pain. While in the ED, serology shows creatine 1.65, total bili 4.2, WBC 15.0, and hemoglobin 11.8. Abdominal CT is consistent with cholecystitis. General surgery and GI was consulted and he was admitted for further evaluation of acute cholelithiasis.   Assessment & Plan:   Principal Problem:   Acute cholecystitis Active Problems:   HTN (hypertension)   Hyperlipidemia with target low density lipoprotein (LDL) cholesterol less than 70 mg/dL   CAD (coronary artery disease)   Atrial fibrillation (HCC)   Cholelithiasis   Elevated LFTs   AKI (acute kidney injury) ([facility])   Hypokalemia  1. Acute cholecystitis. Abdominal CT was consistent with cholecystitis. Since admission, his symptoms have started to improve. LFTs are trending down indicating Possible calculus has likely passed. Continue broad spectrum antibiotics. Keep NPO. Continue Protonix, and analgesic. GI and general surgery following. Await further input from Gen. surgery regarding timing of cholecystectomy. Echocardiogram has been ordered to evaluate cardiac status preoperatively. 2. Escherichia coli sepsis. Blood cultures positive for Escherichia coli, likely from biliary source. Urine cultures show no growth. Continue intravenous antibiotics for now. Follow-up sensitivities. 3. HTN. Continue to hold ramipril due to soft pressures and renal failure. Continue metoprolol.  4. History of present illness . related to volume depletion and sepsis. Creatinine has improved with IV fluids. Continue to monitor 5. A-fib. Patient is not anticoagulated on any medication due to fall risk. Rate is controlled. Continue beta-blocker.  6. CAD hx. S/p 4 CABGs. Continue aspirin.    7. HLD. Continue statin.  8. Anasarca. Patient has been receiving IV fluids and is noted to be hypoalbuminemic likely leading to third spacing of IV fluids. He is hemodynamically stable, will hold off on further IV fluids for now.  DVT prophylaxis: Heparin injection  Code Status: FULL  Family Communication: Wife bedside Disposition Plan: Discharge home once improved.  Consultants:   GI  General surgery   Procedures:   None   Antimicrobials:   Vancomycin 11/14 >> 11/15  Zosyn 11/14>>   Subjective: Denies any shortness of breath, abdominal pain.  Objective: Vitals:   05/21/16 1002 05/21/16 1253 05/21/16 2148 05/22/16 0628  BP: (!) 83/41 (!) 94/47 (!) 107/52 (!) 110/58  Pulse: 87 81 86 88  Resp:  20 20 20   Temp:  98.1 F (36.7 C) 98 F (36.7 C) 98.2 F (36.8 C)  TempSrc:  Oral Oral Oral  SpO2:  100% 97% 96%  Weight:      Height:        Intake/Output Summary (Last 24 hours) at 05/22/16 0855 Last data filed at 05/22/16 [id number]  Gross per 24 hour  Intake           648.42 ml  Output             1350 ml  Net          -701.58 ml   Filed Weights   05/21/16 0123 05/21/16 0639  Weight: 99.8 kg (220 lb) 102.9 kg (226 lb 13.7 oz)    Examination:  General exam: Appears calm and comfortable  Respiratory system: Clear to auscultation. Respiratory effort normal. Cardiovascular system: S1 & S2 heard, RRR. No JVD, murmurs, rubs, gallops or clicks. 1-2+ pedal edema. Gastrointestinal system:  Abdomen is distended, soft and tender in right upper quadrant. No organomegaly or masses felt. Normal bowel sounds heard. Central nervous system: Alert and oriented. No focal neurological deficits. Extremities: Symmetric 5 x 5 power. Skin: No rashes, lesions or ulcers Psychiatry: Judgement and insight appear normal. Mood & affect appropriate.     Data Reviewed: I have personally reviewed following labs and imaging studies  CBC:  Recent Labs Lab 05/21/16 0137 05/22/16 0617   WBC 15.0* 18.7*  NEUTROABS 13.5*  --   HGB 11.8* 10.6*  HCT 36.3* 34.1*  MCV 75.2* 76.3*  PLT 227 [id number]   Basic Metabolic Panel:  Recent Labs Lab 05/21/16 0137 05/22/16 0617  NA 132* 136  K 3.3* 3.9  CL 97* 106  CO2 24 22  GLUCOSE 135* 88  BUN 39* 26*  CREATININE 1.65* 1.31*  CALCIUM 8.3* 7.9*   GFR: Estimated Creatinine Clearance: 50.5 mL/min (by C-G formula based on SCr of 1.31 mg/dL (H)). Liver Function Tests:  Recent Labs Lab 05/21/16 0137 05/21/16 0203 05/22/16 0617  AST 418*  --  199*  ALT 220*  --  54  ALKPHOS 398*  --  326*  BILITOT 4.2* 4.4* 3.6*  PROT 6.8  --  5.7*  ALBUMIN 2.8*  --  2.2*    Recent Labs Lab 05/21/16 0137  LIPASE 31   No results for input(s): AMMONIA in the last 168 hours. Coagulation Profile: No results for input(s): INR, PROTIME in the last 168 hours. Cardiac Enzymes: No results for input(s): CKTOTAL, CKMB, CKMBINDEX, TROPONINI in the last 168 hours. BNP (last 3 results) No results for input(s): PROBNP in the last 8760 hours. HbA1C: No results for input(s): HGBA1C in the last 72 hours. CBG: No results for input(s): GLUCAP in the last 168 hours. Lipid Profile: No results for input(s): CHOL, HDL, LDLCALC, TRIG, CHOLHDL, LDLDIRECT in the last 72 hours. Thyroid Function Tests: No results for input(s): TSH, T4TOTAL, FREET4, T3FREE, THYROIDAB in the last 72 hours. Anemia Panel: No results for input(s): VITAMINB12, FOLATE, FERRITIN, TIBC, IRON, RETICCTPCT in the last 72 hours. Sepsis Labs:  Recent Labs Lab 05/21/16 0153  LATICACIDVEN 1.80    Recent Results (from the past 240 hour(s))  Blood Culture (routine x 2)     Status: Abnormal (Preliminary result)   Collection Time: 05/21/16  1:38 AM  Result Value Ref Range Status   Specimen Description BLOOD RIGHT HAND DRAWN BY RN  Final   Special Requests BOTTLES DRAWN AEROBIC AND ANAEROBIC 8CC EACH  Final   Culture  Setup Time   Final    GRAM NEGATIVE RODS RECOVERED FROM THE  AEROBIC BOTTLE Gram Stain Report Called to,Read Back By and Verified With: COVINGTON,L. AT [id number] ON 05/21/2016 BY BAUGHAM,M. Performed at [facility] BY AND VERIFIED WITH: W BELIZAIRE,RN @0240  05/22/16 MKELLY.MLT    Culture (A)  Final    ESCHERICHIA COLI SUSCEPTIBILITIES TO FOLLOW Performed at [facility]    Report Status PENDING  Incomplete  Blood Culture ID Panel (Reflexed)     Status: Abnormal   Collection Time: 05/21/16  1:38 AM  Result Value Ref Range Status   Enterococcus species NOT DETECTED NOT DETECTED Final   Listeria monocytogenes NOT DETECTED NOT DETECTED Final   Staphylococcus species NOT DETECTED NOT DETECTED Final   Staphylococcus aureus NOT DETECTED NOT DETECTED Final   Streptococcus species NOT DETECTED NOT DETECTED Final   Streptococcus agalactiae NOT DETECTED NOT DETECTED Final   Streptococcus  pneumoniae NOT DETECTED NOT DETECTED Final   Streptococcus pyogenes NOT DETECTED NOT DETECTED Final   Acinetobacter baumannii NOT DETECTED NOT DETECTED Final   Enterobacteriaceae species DETECTED (A) NOT DETECTED Final    Comment: CRITICAL RESULT CALLED TO, READ BACK BY AND VERIFIED WITH: W BELIZAIRE,RN @0240  05/22/16 MKELLY,MLT    Enterobacter cloacae complex NOT DETECTED NOT DETECTED Final   Escherichia coli DETECTED (A) NOT DETECTED Final    Comment: CRITICAL RESULT CALLED TO, READ BACK BY AND VERIFIED WITH: W BELIZAIRE,RN @0240  05/22/16 MKELLY,MLT    Klebsiella oxytoca NOT DETECTED NOT DETECTED Final   Klebsiella pneumoniae NOT DETECTED NOT DETECTED Final   Proteus species NOT DETECTED NOT DETECTED Final   Serratia marcescens NOT DETECTED NOT DETECTED Final   Carbapenem resistance NOT DETECTED NOT DETECTED Final   Haemophilus influenzae NOT DETECTED NOT DETECTED Final   Neisseria meningitidis NOT DETECTED NOT DETECTED Final   Pseudomonas aeruginosa NOT DETECTED NOT DETECTED Final   Candida albicans NOT DETECTED  NOT DETECTED Final   Candida glabrata NOT DETECTED NOT DETECTED Final   Candida krusei NOT DETECTED NOT DETECTED Final   Candida parapsilosis NOT DETECTED NOT DETECTED Final   Candida tropicalis NOT DETECTED NOT DETECTED Final    Comment: Performed at [facility]  Blood Culture (routine x 2)     Status: None (Preliminary result)   Collection Time: 05/21/16  1:48 AM  Result Value Ref Range Status   Specimen Description BLOOD LEFT ANTECUBITAL  Final   Special Requests   Final    BOTTLES DRAWN AEROBIC AND ANAEROBIC AEB=12CC ANA=10CC   Culture  Setup Time   Final    GRAM NEGATIVE RODS RECOVERED FROM THE AEROBIC BOTTLE Gram Stain Report Called to,Read Back By and Verified With: COVINGTON,L. AT [id number] ON 05/21/2016 BY BAUGHAM,M. Performed at [facility]  Final   Report Status PENDING  Incomplete  Urine culture     Status: None   Collection Time: 05/21/16  3:51 AM  Result Value Ref Range Status   Specimen Description URINE, CLEAN CATCH  Final   Special Requests NONE  Final   Culture NO GROWTH Performed at [facility]   Final   Report Status 05/22/2016 FINAL  Final         Radiology Studies: Ct Abdomen Pelvis Wo Contrast  Result Date: 05/21/2016 CLINICAL DATA:  Acute onset of epigastric abdominal pain. Sinus tachycardia and fever. Renal insufficiency. Initial encounter. EXAM: CT ABDOMEN AND PELVIS WITHOUT CONTRAST TECHNIQUE: Multidetector CT imaging of the abdomen and pelvis was performed following the standard protocol without IV contrast. COMPARISON:  None. FINDINGS: Lower chest: Minimal bibasilar atelectasis is noted. Diffuse coronary artery calcifications are seen. The patient is status post median sternotomy. Hepatobiliary: Diffuse soft tissue inflammation and wall thickening is noted about the gallbladder, with trace associated pericholecystic fluid, compatible with acute cholecystitis. A stone is noted within the gallbladder.  The common bile duct remains normal in caliber. A nonspecific 1.4 cm hypodensity is noted at the left hepatic lobe. The liver is otherwise unremarkable. Pancreas: The pancreas is within normal limits. Spleen: The spleen is unremarkable in appearance. Adrenals/Urinary Tract: The adrenal glands are unremarkable in appearance. The kidneys are within normal limits. There is no evidence of hydronephrosis. No renal or ureteral stones are identified. No perinephric stranding is seen. Nonspecific perinephric stranding is noted bilaterally. Mild bilateral renal atrophy is seen. A small right renal cyst is noted. There is no  evidence of hydronephrosis. No renal or ureteral stones are identified. Stomach/Bowel: The stomach is unremarkable in appearance. The small bowel is within normal limits. The appendix is normal in caliber, without evidence of appendicitis. Minimal diverticulosis is noted along the proximal sigmoid colon, without evidence of diverticulitis. Vascular/Lymphatic: Scattered calcification is seen along the abdominal aorta and its branches. The abdominal aorta is otherwise grossly unremarkable. The inferior vena cava is grossly unremarkable. No retroperitoneal lymphadenopathy is seen. No pelvic sidewall lymphadenopathy is identified. Reproductive: A focal diverticulum is noted arising at the dome of the bladder. The bladder is otherwise unremarkable. The prostate remains normal in size. Other: No additional soft tissue abnormalities are seen. Musculoskeletal: No acute osseous abnormalities are identified. Mild vacuum phenomenon is noted at L4-L5. The visualized musculature is unremarkable in appearance. IMPRESSION: 1. Diffuse soft tissue inflammation and wall thickening at the gallbladder, with trace associated pericholecystic fluid, compatible with acute cholecystitis. Underlying cholelithiasis noted. 2. **An incidental finding of potential clinical significance has been found. Nonspecific 1.4 cm hypodensity at  the left hepatic lobe. Would correlate with LFTs after completion of treatment for cholecystitis.** 3. Mild bilateral renal atrophy.  Small right renal cyst noted. 4. Minimal diverticulosis along the proximal sigmoid colon, without evidence of diverticulitis. 5. Scattered aortic atherosclerosis. 6. Focal bladder diverticulum noted. Bladder otherwise grossly unremarkable. Electronically Signed   By: Garald Balding M.D.   On: 05/21/2016 03:45   US Abdomen Complete  Result Date: 05/21/2016 CLINICAL DATA:  Abnormal CT.  Question cholecystitis. EXAM: ABDOMEN ULTRASOUND COMPLETE COMPARISON:  CT of earlier today FINDINGS: Gallbladder: Gallbladder sludge. Suspect a 12 mm stone. Gallbladder wall thickening at up to 11 mm. Small volume pericholecystic fluid. Sonographic Murphy's sign was not elicited. Common bile duct: Diameter: Upper normal for age, 8 mm. No intrahepatic ductal dilatation. Liver: Left hepatic lobe hyperechoic lesion is well-circumscribed and measures 1.1 cm. IVC: No abnormality visualized. Pancreas: Obscured by bowel gas. Spleen: Size and appearance within normal limits. Right Kidney: Length: 13.0 cm. No hydronephrosis. A lower pole right renal 4.8 cm lesion appears minimally complex, but is fluid density on the CT. An interpolar central right renal 1.5 cm hypoechoic lesion likely corresponds to a fluid density lesion on image 42 of today's CT. Left Kidney: Length: 11.4 cm. Echogenicity within normal limits. No mass or hydronephrosis visualized. Abdominal aorta: No aneurysm visualized. Other findings: No ascites. IMPRESSION: 1. Gallbladder sludge with possible stone. Wall thickening and pericholecystic fluid are suspicious for acute cholecystitis. No sonographic Murphy sign to confirm acute cholecystitis. 2. Upper normal common duct size.  Correlate with bilirubin levels. 3. Right renal lesions are likely cysts given CT appearance. Recommend attention on follow-up. 4. Hyperechoic left hepatic lobe  lesion is favored to represent a hemangioma. Technically indeterminate. Consider follow-up with ultrasound at 6 months to confirm size stability. Alternatively, outpatient pre and post contrast abdominal MRI could confirm a benign hemangioma. Electronically Signed   By: Abigail Miyamoto M.D.   On: 05/21/2016 09:02   Dg Chest Port 1 View  Result Date: 05/21/2016 CLINICAL DATA:  Epigastric pain.  Febrile. EXAM: PORTABLE CHEST 1 VIEW COMPARISON:  11/26/2011 FINDINGS: A single AP portable view of the chest demonstrates no focal airspace consolidation or alveolar edema. The lungs are grossly clear. There is no large effusion or pneumothorax. Cardiac and mediastinal contours appear unremarkable. IMPRESSION: No active disease. Electronically Signed   By: Andreas Newport M.D.   On: 05/21/2016 01:46        Scheduled Meds: .  aspirin EC  81 mg Oral Daily  . atorvastatin  40 mg Oral QHS  . carbidopa-levodopa  1 tablet Oral TID  . chlorhexidine  15 mL Mouth Rinse BID  . heparin  5,000 Units Subcutaneous Q8H  . mouth rinse  15 mL Mouth Rinse q12n4p  . metoprolol  12.5 mg Oral BID  . pantoprazole  40 mg Oral Daily  . piperacillin-tazobactam (ZOSYN)  IV  3.375 g Intravenous Q8H  . sodium chloride flush  3 mL Intravenous Q12H  . vancomycin  1,000 mg Intravenous Q24H   Continuous Infusions: . 0.9 % NaCl with KCl 40 mEq / L 100 mL/hr (05/21/16 1400)     LOS: 1 day    Time spent: 25 minutes     Kathie Dike, MD [facility] If 7PM-7AM, please contact night-coverage [email or web address] Password [email or web address] 05/22/2016, 8:55 AM

## 2016-05-22 NOTE — Progress Notes (Signed)
Spoke with Dr. Arnoldo Morale. Michela Pitcher he would see patient in the AM about cholecystectomy, possibly on Friday.

## 2016-05-22 NOTE — Progress Notes (Signed)
Subjective:  No specific complaints. Drowsy. Alert and oriented to person/place/time.   Objective: Vital signs in last 24 hours: Temp:  [98 F (36.7 C)-98.2 F (36.8 C)] 98.2 F (36.8 C) (11/15 0628) Pulse Rate:  [81-88] 88 (11/15 0628) Resp:  [20] 20 (11/15 0628) BP: (83-110)/(41-58) 110/58 (11/15 0628) SpO2:  [96 %-100 %] 96 % (11/15 0628)   General:   Alert,  Slow to respond but answers questions appropriately. Flat affect. cooperative in NAD Head:  Normocephalic and atraumatic. Eyes:  Sclera clear, no icterus.  Abdomen:  Soft, mild to moderate ruq tendnerness. Normal bowel sounds, without guarding, and without rebound.   Extremities:  Without clubbing, deformity or edema. Neurologic:  Alert and  oriented x4;  grossly normal neurologically. Skin:  Intact without significant lesions or rashes. Psych:  Alert and cooperative. Normal mood and flat affect.  Intake/Output from previous day: 11/14 0701 - 11/15 0700 In: 648.4 [P.O.:180; I.V.:168.4; IV Piggyback:300] Out: 1350 [Urine:1350] Intake/Output this shift: No intake/output data recorded.  Lab Results: CBC  Recent Labs  05/21/16 0137 05/22/16 0617  WBC 15.0* 18.7*  HGB 11.8* 10.6*  HCT 36.3* 34.1*  MCV 75.2* 76.3*  PLT 227 226   BMET  Recent Labs  05/21/16 0137 05/22/16 0617  NA 132* 136  K 3.3* 3.9  CL 97* 106  CO2 24 22  GLUCOSE 135* 88  BUN 39* 26*  CREATININE 1.65* 1.31*  CALCIUM 8.3* 7.9*   LFTs  Recent Labs  05/21/16 0137 05/21/16 0203 05/22/16 0617  BILITOT 4.2* 4.4* 3.6*  BILIDIR  --  2.3* 2.2*  IBILI  --  2.1* 1.4*  ALKPHOS 398*  --  326*  AST 418*  --  199*  ALT 220*  --  54  PROT 6.8  --  5.7*  ALBUMIN 2.8*  --  2.2*    Recent Labs  05/21/16 0137  LIPASE 31   PT/INR No results for input(s): LABPROT, INR in the last 72 hours.    Imaging Studies: Ct Abdomen Pelvis Wo Contrast  Result Date: 05/21/2016 CLINICAL DATA:  Acute onset of epigastric abdominal pain. Sinus  tachycardia and fever. Renal insufficiency. Initial encounter. EXAM: CT ABDOMEN AND PELVIS WITHOUT CONTRAST TECHNIQUE: Multidetector CT imaging of the abdomen and pelvis was performed following the standard protocol without IV contrast. COMPARISON:  None. FINDINGS: Lower chest: Minimal bibasilar atelectasis is noted. Diffuse coronary artery calcifications are seen. The patient is status post median sternotomy. Hepatobiliary: Diffuse soft tissue inflammation and wall thickening is noted about the gallbladder, with trace associated pericholecystic fluid, compatible with acute cholecystitis. A stone is noted within the gallbladder. The common bile duct remains normal in caliber. A nonspecific 1.4 cm hypodensity is noted at the left hepatic lobe. The liver is otherwise unremarkable. Pancreas: The pancreas is within normal limits. Spleen: The spleen is unremarkable in appearance. Adrenals/Urinary Tract: The adrenal glands are unremarkable in appearance. The kidneys are within normal limits. There is no evidence of hydronephrosis. No renal or ureteral stones are identified. No perinephric stranding is seen. Nonspecific perinephric stranding is noted bilaterally. Mild bilateral renal atrophy is seen. A small right renal cyst is noted. There is no evidence of hydronephrosis. No renal or ureteral stones are identified. Stomach/Bowel: The stomach is unremarkable in appearance. The small bowel is within normal limits. The appendix is normal in caliber, without evidence of appendicitis. Minimal diverticulosis is noted along the proximal sigmoid colon, without evidence of diverticulitis. Vascular/Lymphatic: Scattered calcification is seen along the abdominal aorta  and its branches. The abdominal aorta is otherwise grossly unremarkable. The inferior vena cava is grossly unremarkable. No retroperitoneal lymphadenopathy is seen. No pelvic sidewall lymphadenopathy is identified. Reproductive: A focal diverticulum is noted arising at  the dome of the bladder. The bladder is otherwise unremarkable. The prostate remains normal in size. Other: No additional soft tissue abnormalities are seen. Musculoskeletal: No acute osseous abnormalities are identified. Mild vacuum phenomenon is noted at L4-L5. The visualized musculature is unremarkable in appearance. IMPRESSION: 1. Diffuse soft tissue inflammation and wall thickening at the gallbladder, with trace associated pericholecystic fluid, compatible with acute cholecystitis. Underlying cholelithiasis noted. 2. **An incidental finding of potential clinical significance has been found. Nonspecific 1.4 cm hypodensity at the left hepatic lobe. Would correlate with LFTs after completion of treatment for cholecystitis.** 3. Mild bilateral renal atrophy.  Small right renal cyst noted. 4. Minimal diverticulosis along the proximal sigmoid colon, without evidence of diverticulitis. 5. Scattered aortic atherosclerosis. 6. Focal bladder diverticulum noted. Bladder otherwise grossly unremarkable. Electronically Signed   By: Garald Balding M.D.   On: 05/21/2016 03:45   US Abdomen Complete  Result Date: 05/21/2016 CLINICAL DATA:  Abnormal CT.  Question cholecystitis. EXAM: ABDOMEN ULTRASOUND COMPLETE COMPARISON:  CT of earlier today FINDINGS: Gallbladder: Gallbladder sludge. Suspect a 12 mm stone. Gallbladder wall thickening at up to 11 mm. Small volume pericholecystic fluid. Sonographic Murphy's sign was not elicited. Common bile duct: Diameter: Upper normal for age, 8 mm. No intrahepatic ductal dilatation. Liver: Left hepatic lobe hyperechoic lesion is well-circumscribed and measures 1.1 cm. IVC: No abnormality visualized. Pancreas: Obscured by bowel gas. Spleen: Size and appearance within normal limits. Right Kidney: Length: 13.0 cm. No hydronephrosis. A lower pole right renal 4.8 cm lesion appears minimally complex, but is fluid density on the CT. An interpolar central right renal 1.5 cm hypoechoic lesion  likely corresponds to a fluid density lesion on image 42 of today's CT. Left Kidney: Length: 11.4 cm. Echogenicity within normal limits. No mass or hydronephrosis visualized. Abdominal aorta: No aneurysm visualized. Other findings: No ascites. IMPRESSION: 1. Gallbladder sludge with possible stone. Wall thickening and pericholecystic fluid are suspicious for acute cholecystitis. No sonographic Murphy sign to confirm acute cholecystitis. 2. Upper normal common duct size.  Correlate with bilirubin levels. 3. Right renal lesions are likely cysts given CT appearance. Recommend attention on follow-up. 4. Hyperechoic left hepatic lobe lesion is favored to represent a hemangioma. Technically indeterminate. Consider follow-up with ultrasound at 6 months to confirm size stability. Alternatively, outpatient pre and post contrast abdominal MRI could confirm a benign hemangioma. Electronically Signed   By: Abigail Miyamoto M.D.   On: 05/21/2016 09:02   Dg Chest Port 1 View  Result Date: 05/21/2016 CLINICAL DATA:  Epigastric pain.  Febrile. EXAM: PORTABLE CHEST 1 VIEW COMPARISON:  11/26/2011 FINDINGS: A single AP portable view of the chest demonstrates no focal airspace consolidation or alveolar edema. The lungs are grossly clear. There is no large effusion or pneumothorax. Cardiac and mediastinal contours appear unremarkable. IMPRESSION: No active disease. Electronically Signed   By: Andreas Newport M.D.   On: 05/21/2016 01:46  [2 weeks]   Assessment:  82 year old male presenting with acute cholecystitis, elevated LFTs, US abdomen without definitive evidence of CBD stones but mild dilation noted. Suspected to have passed a stone with significant improvement in AST/ALT overnight and some improvement in bili/alkphos as well. He has positive blood cultures and worsening leukocytosis. Patient currently on Zosyn and Vancomycin.    Plan: 1. No  indication for ERCP at this time.  2. Continue antibiotic coverage.   3. Recommend cholecystectomy as next step.  4. Will continue to follow with you.   Laureen Ochs. Bernarda Caffey [facility] [phone] 11/15/20179:46 AM     LOS: 1 day

## 2016-05-23 ENCOUNTER — Encounter (HOSPITAL_COMMUNITY): Payer: Self-pay | Admitting: Cardiology

## 2016-05-23 ENCOUNTER — Inpatient Hospital Stay (HOSPITAL_COMMUNITY): Payer: Medicare PPO

## 2016-05-23 DIAGNOSIS — E785 Hyperlipidemia, unspecified: Secondary | ICD-10-CM

## 2016-05-23 DIAGNOSIS — I2510 Atherosclerotic heart disease of native coronary artery without angina pectoris: Secondary | ICD-10-CM

## 2016-05-23 LAB — CBC
HCT: 34.3 % — ABNORMAL LOW (ref 39.0–52.0)
HEMOGLOBIN: 10.6 g/dL — AB (ref 13.0–17.0)
MCH: 23.6 pg — AB (ref 26.0–34.0)
MCHC: 30.9 g/dL (ref 30.0–36.0)
MCV: 76.2 fL — AB (ref 78.0–100.0)
Platelets: 240 10*3/uL (ref 150–400)
RBC: 4.5 MIL/uL (ref 4.22–5.81)
RDW: 17.4 % — ABNORMAL HIGH (ref 11.5–15.5)
WBC: 15.2 10*3/uL — ABNORMAL HIGH (ref 4.0–10.5)

## 2016-05-23 LAB — CULTURE, BLOOD (ROUTINE X 2)

## 2016-05-23 LAB — HEPATIC FUNCTION PANEL
ALK PHOS: 291 U/L — AB (ref 38–126)
ALT: 76 U/L — AB (ref 17–63)
AST: 112 U/L — ABNORMAL HIGH (ref 15–41)
Albumin: 2 g/dL — ABNORMAL LOW (ref 3.5–5.0)
BILIRUBIN DIRECT: 0.7 mg/dL — AB (ref 0.1–0.5)
BILIRUBIN INDIRECT: 1 mg/dL — AB (ref 0.3–0.9)
TOTAL PROTEIN: 5.6 g/dL — AB (ref 6.5–8.1)
Total Bilirubin: 1.7 mg/dL — ABNORMAL HIGH (ref 0.3–1.2)

## 2016-05-23 LAB — ECHOCARDIOGRAM COMPLETE
HEIGHTINCHES: 69 in
WEIGHTICAEL: 3629.65 [oz_av]

## 2016-05-23 LAB — BASIC METABOLIC PANEL
Anion gap: 6 (ref 5–15)
BUN: 24 mg/dL — AB (ref 6–20)
CHLORIDE: 106 mmol/L (ref 101–111)
CO2: 25 mmol/L (ref 22–32)
Calcium: 8 mg/dL — ABNORMAL LOW (ref 8.9–10.3)
Creatinine, Ser: 1.21 mg/dL (ref 0.61–1.24)
GFR calc Af Amer: >60 mL/min (ref >60)
GFR calc non Af Amer: 54 mL/min — ABNORMAL LOW (ref >60)
GLUCOSE: 87 mg/dL (ref 65–99)
POTASSIUM: 3.9 mmol/L (ref 3.5–5.1)
Sodium: 137 mmol/L (ref 135–145)

## 2016-05-23 MED ORDER — DEXTROSE 5 % IV SOLN
2.0000 g | INTRAVENOUS | Status: DC
  Administered 2016-05-23 – 2016-05-26 (×4): 2 g via INTRAVENOUS
  Filled 2016-05-23 (×5): qty 2

## 2016-05-23 MED ORDER — DEXTROSE 5 % IV SOLN
INTRAVENOUS | Status: AC
  Filled 2016-05-23: qty 2

## 2016-05-23 NOTE — Progress Notes (Signed)
*  PRELIMINARY RESULTS* Echocardiogram 2D Echocardiogram has been performed.  Samuel Germany 05/23/2016, 2:42 PM

## 2016-05-23 NOTE — Consult Note (Signed)
Reason for Consult: Cholecystitis, cholelithiasis, elevated liver enzyme tests Referring Physician: Dr. Duffy Rhody Jesus Maldonado is an 82 y.o. male.  HPI: Patient is an 82 year old white male who presented to the emergency room with worsening right upper quadrant abdominal pain. Workup revealed acute cholecystitis with cholelithiasis. There was also elevated liver enzyme tests. He was admitted to the hospital for further evaluation and treatment. He did have positive blood cultures for gram-negative rods. He was placed on IV antibiotics. Since his admission, his liver enzyme tests have improved. His leukocytosis has improved. It is difficult to get a full history from the patient. He does have a history of coronary artery disease, hypertension, and Parkinson's disease. Surgery was consulted for possible cholecystectomy.  Past Medical History:  Diagnosis Date  . Arthritis   . Coronary artery disease    s/p PCI 10 yrs ago.  Marland Kitchen Hypercholesteremia   . Hypertension   . Myocardial infarct   . Parkinson disease [facility])     Past Surgical History:  Procedure Laterality Date  . cardiac stents    . CORONARY ARTERY BYPASS GRAFT  11/01/2011   Procedure: CORONARY ARTERY BYPASS GRAFTING (CABG);  Surgeon: Melrose Nakayama, MD;  Location: [facility];  Service: Open Heart Surgery;  Laterality: N/A;  Coronary Artery bypass graft times four utilizing the left internal mammary artery and the right greater saphenous vein harvested endoscopically.  Marland Kitchen HEMORROIDECTOMY    . LEFT HEART CATHETERIZATION WITH CORONARY ANGIOGRAM N/A 10/28/2011   Procedure: LEFT HEART CATHETERIZATION WITH CORONARY ANGIOGRAM;  Surgeon: Burnell Blanks, MD;  Location: [facility] CATH LAB;  Service: Cardiovascular;  Laterality: N/A;  . PERCUTANEOUS CORONARY INTERVENTION-BALLOON ONLY  10/28/2011   Procedure: PERCUTANEOUS CORONARY INTERVENTION-BALLOON ONLY;  Surgeon: Burnell Blanks, MD;  Location: [facility] CATH LAB;  Service: Cardiovascular;;  .  STERNAL INCISION RECLOSURE  11/06/2011   Procedure: STERNAL REWIRING;  Surgeon: Melrose Nakayama, MD;  Location: [facility];  Service: Open Heart Surgery;  Laterality: N/A;    Family History  Problem Relation Age of Onset  . Colon cancer Neg Hx     Social History:  reports that he quit smoking about 44 years ago. His smoking use included Cigarettes. He has never used smokeless tobacco. He reports that he does not drink alcohol or use drugs.  Allergies: No Known Allergies  Medications:  Scheduled: . aspirin EC  81 mg Oral Daily  . atorvastatin  40 mg Oral QHS  . carbidopa-levodopa  1 tablet Oral TID  . chlorhexidine  15 mL Mouth Rinse BID  . heparin  5,000 Units Subcutaneous Q8H  . mouth rinse  15 mL Mouth Rinse q12n4p  . metoprolol  12.5 mg Oral BID  . pantoprazole  40 mg Oral Daily  . piperacillin-tazobactam (ZOSYN)  IV  3.375 g Intravenous Q8H  . sodium chloride flush  3 mL Intravenous Q12H   Continuous:   Results for orders placed or performed during the hospital encounter of 05/21/16 (from the past 48 hour(s))  Basic metabolic panel     Status: Abnormal   Collection Time: 05/22/16  6:17 AM  Result Value Ref Range   Sodium 136 135 - 145 mmol/L   Potassium 3.9 3.5 - 5.1 mmol/L   Chloride 106 101 - 111 mmol/L   CO2 22 22 - 32 mmol/L   Glucose, Bld 88 65 - 99 mg/dL   BUN 26 (H) 6 - 20 mg/dL   Creatinine, Ser 1.31 (H) 0.61 - 1.24 mg/dL   Calcium  7.9 (L) 8.9 - 10.3 mg/dL   GFR calc non Af Amer 49 (L) >60 mL/min   GFR calc Af Amer 56 (L) >60 mL/min    Comment: (NOTE) The eGFR has been calculated using the CKD EPI equation. This calculation has not been validated in all clinical situations. eGFR's persistently <60 mL/min signify possible Chronic Kidney Disease.    Anion gap 8 5 - 15  Hepatic function panel     Status: Abnormal   Collection Time: 05/22/16  6:17 AM  Result Value Ref Range   Total Protein 5.7 (L) 6.5 - 8.1 g/dL   Albumin 2.2 (L) 3.5 - 5.0 g/dL   AST 199  (H) 15 - 41 U/L   ALT 54 17 - 63 U/L   Alkaline Phosphatase 326 (H) 38 - 126 U/L   Total Bilirubin 3.6 (H) 0.3 - 1.2 mg/dL   Bilirubin, Direct 2.2 (H) 0.1 - 0.5 mg/dL   Indirect Bilirubin 1.4 (H) 0.3 - 0.9 mg/dL  CBC     Status: Abnormal   Collection Time: 05/22/16  6:17 AM  Result Value Ref Range   WBC 18.7 (H) 4.0 - 10.5 K/uL   RBC 4.47 4.22 - 5.81 MIL/uL   Hemoglobin 10.6 (L) 13.0 - 17.0 g/dL   HCT 34.1 (L) 39.0 - 52.0 %   MCV 76.3 (L) 78.0 - 100.0 fL   MCH 23.7 (L) 26.0 - 34.0 pg   MCHC 31.1 30.0 - 36.0 g/dL   RDW 17.1 (H) 11.5 - 15.5 %   Platelets 226 150 - 400 K/uL  CBC     Status: Abnormal   Collection Time: 05/23/16  6:15 AM  Result Value Ref Range   WBC 15.2 (H) 4.0 - 10.5 K/uL   RBC 4.50 4.22 - 5.81 MIL/uL   Hemoglobin 10.6 (L) 13.0 - 17.0 g/dL   HCT 34.3 (L) 39.0 - 52.0 %   MCV 76.2 (L) 78.0 - 100.0 fL   MCH 23.6 (L) 26.0 - 34.0 pg   MCHC 30.9 30.0 - 36.0 g/dL   RDW 17.4 (H) 11.5 - 15.5 %   Platelets 240 150 - 400 K/uL  Basic metabolic panel     Status: Abnormal   Collection Time: 05/23/16  6:15 AM  Result Value Ref Range   Sodium 137 135 - 145 mmol/L   Potassium 3.9 3.5 - 5.1 mmol/L   Chloride 106 101 - 111 mmol/L   CO2 25 22 - 32 mmol/L   Glucose, Bld 87 65 - 99 mg/dL   BUN 24 (H) 6 - 20 mg/dL   Creatinine, Ser 1.21 0.61 - 1.24 mg/dL   Calcium 8.0 (L) 8.9 - 10.3 mg/dL   GFR calc non Af Amer 54 (L) >60 mL/min   GFR calc Af Amer >60 >60 mL/min    Comment: (NOTE) The eGFR has been calculated using the CKD EPI equation. This calculation has not been validated in all clinical situations. eGFR's persistently <60 mL/min signify possible Chronic Kidney Disease.    Anion gap 6 5 - 15  Hepatic function panel     Status: Abnormal   Collection Time: 05/23/16  6:21 AM  Result Value Ref Range   Total Protein 5.6 (L) 6.5 - 8.1 g/dL   Albumin 2.0 (L) 3.5 - 5.0 g/dL   AST 112 (H) 15 - 41 U/L   ALT 76 (H) 17 - 63 U/L   Alkaline Phosphatase 291 (H) 38 - 126 U/L    Total Bilirubin 1.7 (H) 0.3 -  1.2 mg/dL   Bilirubin, Direct 0.7 (H) 0.1 - 0.5 mg/dL   Indirect Bilirubin 1.0 (H) 0.3 - 0.9 mg/dL    US Abdomen Complete  Result Date: 05/21/2016 CLINICAL DATA:  Abnormal CT.  Question cholecystitis. EXAM: ABDOMEN ULTRASOUND COMPLETE COMPARISON:  CT of earlier today FINDINGS: Gallbladder: Gallbladder sludge. Suspect a 12 mm stone. Gallbladder wall thickening at up to 11 mm. Small volume pericholecystic fluid. Sonographic Murphy's sign was not elicited. Common bile duct: Diameter: Upper normal for age, 8 mm. No intrahepatic ductal dilatation. Liver: Left hepatic lobe hyperechoic lesion is well-circumscribed and measures 1.1 cm. IVC: No abnormality visualized. Pancreas: Obscured by bowel gas. Spleen: Size and appearance within normal limits. Right Kidney: Length: 13.0 cm. No hydronephrosis. A lower pole right renal 4.8 cm lesion appears minimally complex, but is fluid density on the CT. An interpolar central right renal 1.5 cm hypoechoic lesion likely corresponds to a fluid density lesion on image 42 of today's CT. Left Kidney: Length: 11.4 cm. Echogenicity within normal limits. No mass or hydronephrosis visualized. Abdominal aorta: No aneurysm visualized. Other findings: No ascites. IMPRESSION: 1. Gallbladder sludge with possible stone. Wall thickening and pericholecystic fluid are suspicious for acute cholecystitis. No sonographic Murphy sign to confirm acute cholecystitis. 2. Upper normal common duct size.  Correlate with bilirubin levels. 3. Right renal lesions are likely cysts given CT appearance. Recommend attention on follow-up. 4. Hyperechoic left hepatic lobe lesion is favored to represent a hemangioma. Technically indeterminate. Consider follow-up with ultrasound at 6 months to confirm size stability. Alternatively, outpatient pre and post contrast abdominal MRI could confirm a benign hemangioma. Electronically Signed   By: Abigail Miyamoto M.D.   On: 05/21/2016 09:02     ROS:  Review of systems not obtained due to patient factors.  Blood pressure (!) 126/55, pulse 83, temperature 98.4 F (36.9 C), temperature source Oral, resp. rate 20, height 5' 9"  (1.753 m), weight 102.9 kg (226 lb 13.7 oz), SpO2 97 %. Physical Exam: Pleasant white male in no acute distress. HEENT examination reveals no scleral icterus. Head is normocephalic, atraumatic. Lungs clear to auscultation with breath sounds bilaterally. Heart examination reveals a regular rate and rhythm with occasional skipped beats. Abdomen is soft with tenderness noted with palpation in the right upper quadrant. A small umbilical hernia is present. No rigidity noted. No hepatosplenomegaly noted. GI consultation notes reviewed.  Assessment/Plan: Impression: Cholecystitis, cholelithiasis, normalizing liver enzyme tests. Suspect he has passed a gallstone. He does have a cardiac history and was last seen by cardiology in January of this year. Plan: Will get cardiology consultation for preoperative clearance. Pending that consultation, will proceed with laparoscopic cholecystectomy, possible cholangiograms.  Babs Dabbs A 05/23/2016, 8:37 AM

## 2016-05-23 NOTE — Progress Notes (Signed)
Preliminary echo shows normal ejection fraction.  Will proceed with laparoscopic cholecystectomy with possible cholangiograms tomorrow.  The risks and benefits of the procedure including bleeding, infection, hepatobiliary injury, and the possibility of an open procedure were fully explained to the patient, who gives informed consent.  Discussed with daughter.

## 2016-05-23 NOTE — Progress Notes (Signed)
PROGRESS NOTE    Jesus Maldonado  [id number] DOB: 10-Mar-1931 DOA: 05/21/2016 PCP: Kendrick Ranch, MD    Brief Narrative:  72 yom with a hx of CAD s/p CABG x4. A-fib not on any anticoagulation, HTN, HLD, and parkinson disease, presented with complaints of abdominal pain. While in the ED, serology shows creatine 1.65, total bili 4.2, WBC 15.0, and hemoglobin 11.8. Abdominal CT is consistent with cholecystitis. General surgery and GI was consulted and he was admitted for further evaluation of acute cholecystitis.    Assessment & Plan:   Principal Problem:   Acute cholecystitis Active Problems:   HTN (hypertension)   Hyperlipidemia with target low density lipoprotein (LDL) cholesterol less than 70 mg/dL   CAD (coronary artery disease)   Atrial fibrillation (HCC)   Cholelithiasis   Elevated LFTs   AKI (acute kidney injury) ([place])   Hypokalemia   Sepsis ([place])   E coli bacteremia  1. Acute cholecystitis. Abdominal CT was consistent with cholecystitis. Since admission, his symptoms have started to improve. LFTs are trending down indicating Possible calculus has likely passed. Continue broad spectrum antibiotics. Keep NPO. Continue Protonix, and analgesic. GI and general surgery following. Plans are for cholecystectomy once cleared by cardiology. 2. Escherichia coli sepsis. Blood cultures positive for Escherichia coli, likely from biliary source. Urine cultures show no growth. Change Zosyn to Rocephin based on blood culture sensitivities. 3. HTN. Continue to hold ramipril due to soft pressures and renal failure. Continue metoprolol.  4. AKI. related to volume depletion and sepsis. Creatinine has improved with IV fluids. Continue to monitor 5. A-fib. Patient is not anticoagulated on any medication due to fall risk. Rate is controlled. Continue beta-blocker.  6. CAD hx. S/p 4 CABGs. Continue aspirin.  7. HLD. Continue statin.  8. Anasarca. Patient has been receiving IV fluids and  is noted to be hypoalbuminemic likely leading to third spacing of IV fluids. He is hemodynamically stable, will hold off on further IV fluids for now.  DVT prophylaxis: Heparin injection  Code Status: FULL  Family Communication: Discussed with daughter at bedside Disposition Plan: Discharge home once improved.    Consultants:   GI  General surgery   Procedures:   None   Antimicrobials:  Vancomycin 11/14 >> 11/15  Zosyn 11/14>>11/16  Rocephin 11/16>>   Subjective: Feeling better. No shortness of breath. Mild abdominal pain on the right side.  Objective: Vitals:   05/22/16 0628 05/22/16 1557 05/22/16 2105 05/23/16 0606  BP: (!) 110/58 (!) 124/57 133/61 (!) 126/55  Pulse: 88 76 76 83  Resp: 20 18 20 20   Temp: 98.2 F (36.8 C) 98.3 F (36.8 C) 98.4 F (36.9 C) 98.4 F (36.9 C)  TempSrc: Oral Oral Axillary Oral  SpO2: 96% 99% 100% 97%  Weight:      Height:        Intake/Output Summary (Last 24 hours) at 05/23/16 0744 Last data filed at 05/23/16 0606  Gross per 24 hour  Intake              120 ml  Output              950 ml  Net             -830 ml   Filed Weights   05/21/16 0123 05/21/16 0639  Weight: 99.8 kg (220 lb) 102.9 kg (226 lb 13.7 oz)    Examination:  General exam: Appears calm and comfortable  Respiratory system: Clear to auscultation. Respiratory effort normal. Cardiovascular  system: S1 & S2 heard, RRR. No JVD, murmurs, rubs, gallops or clicks. 1+ pedal edema. Gastrointestinal system: Abdomen is nondistended, soft and tender in RUQ. No organomegaly or masses felt. Normal bowel sounds heard. Central nervous system: Alert and oriented. No focal neurological deficits. Extremities: Symmetric 5 x 5 power. Skin: No rashes, lesions or ulcers Psychiatry: Judgement and insight appear normal. Mood & affect appropriate.     Data Reviewed: I have personally reviewed following labs and imaging studies  CBC:  Recent Labs Lab 05/21/16 0137  05/22/16 0617 05/23/16 0615  WBC 15.0* 18.7* 15.2*  NEUTROABS 13.5*  --   --   HGB 11.8* 10.6* 10.6*  HCT 36.3* 34.1* 34.3*  MCV 75.2* 76.3* 76.2*  PLT 227 226 [id number]   Basic Metabolic Panel:  Recent Labs Lab 05/21/16 0137 05/22/16 0617 05/23/16 0615  NA 132* 136 137  K 3.3* 3.9 3.9  CL 97* 106 106  CO2 24 22 25   GLUCOSE 135* 88 87  BUN 39* 26* 24*  CREATININE 1.65* 1.31* 1.21  CALCIUM 8.3* 7.9* 8.0*   GFR: Estimated Creatinine Clearance: 54.7 mL/min (by C-G formula based on SCr of 1.21 mg/dL). Liver Function Tests:  Recent Labs Lab 05/21/16 0137 05/21/16 0203 05/22/16 0617 05/23/16 0621  AST 418*  --  199* 112*  ALT 220*  --  54 76*  ALKPHOS 398*  --  326* 291*  BILITOT 4.2* 4.4* 3.6* 1.7*  PROT 6.8  --  5.7* 5.6*  ALBUMIN 2.8*  --  2.2* 2.0*    Recent Labs Lab 05/21/16 0137  LIPASE 31   No results for input(s): AMMONIA in the last 168 hours. Coagulation Profile: No results for input(s): INR, PROTIME in the last 168 hours. Cardiac Enzymes: No results for input(s): CKTOTAL, CKMB, CKMBINDEX, TROPONINI in the last 168 hours. BNP (last 3 results) No results for input(s): PROBNP in the last 8760 hours. HbA1C: No results for input(s): HGBA1C in the last 72 hours. CBG: No results for input(s): GLUCAP in the last 168 hours. Lipid Profile: No results for input(s): CHOL, HDL, LDLCALC, TRIG, CHOLHDL, LDLDIRECT in the last 72 hours. Thyroid Function Tests: No results for input(s): TSH, T4TOTAL, FREET4, T3FREE, THYROIDAB in the last 72 hours. Anemia Panel: No results for input(s): VITAMINB12, FOLATE, FERRITIN, TIBC, IRON, RETICCTPCT in the last 72 hours. Sepsis Labs:  Recent Labs Lab 05/21/16 0153  LATICACIDVEN 1.80    Recent Results (from the past 240 hour(s))  Blood Culture (routine x 2)     Status: Abnormal (Preliminary result)   Collection Time: 05/21/16  1:38 AM  Result Value Ref Range Status   Specimen Description BLOOD RIGHT HAND DRAWN BY RN   Final   Special Requests BOTTLES DRAWN AEROBIC AND ANAEROBIC 8CC EACH  Final   Culture  Setup Time   Final    GRAM NEGATIVE RODS RECOVERED FROM THE AEROBIC BOTTLE Gram Stain Report Called to,Read Back By and Verified With: COVINGTON,L. AT [id number] ON 05/21/2016 BY BAUGHAM,M. Performed at [place] BY AND VERIFIED WITH: W BELIZAIRE,RN @0240  05/22/16 [email or web address]    Culture (A)  Final    ESCHERICHIA COLI SUSCEPTIBILITIES TO FOLLOW Performed at [facility]    Report Status PENDING  Incomplete  Blood Culture ID Panel (Reflexed)     Status: Abnormal   Collection Time: 05/21/16  1:38 AM  Result Value Ref Range Status   Enterococcus species NOT DETECTED NOT DETECTED Final   Listeria monocytogenes NOT  DETECTED NOT DETECTED Final   Staphylococcus species NOT DETECTED NOT DETECTED Final   Staphylococcus aureus NOT DETECTED NOT DETECTED Final   Streptococcus species NOT DETECTED NOT DETECTED Final   Streptococcus agalactiae NOT DETECTED NOT DETECTED Final   Streptococcus pneumoniae NOT DETECTED NOT DETECTED Final   Streptococcus pyogenes NOT DETECTED NOT DETECTED Final   Acinetobacter baumannii NOT DETECTED NOT DETECTED Final   Enterobacteriaceae species DETECTED (A) NOT DETECTED Final    Comment: CRITICAL RESULT CALLED TO, READ BACK BY AND VERIFIED WITH: W BELIZAIRE,RN @0240  05/22/16 MKELLY,MLT    Enterobacter cloacae complex NOT DETECTED NOT DETECTED Final   Escherichia coli DETECTED (A) NOT DETECTED Final    Comment: CRITICAL RESULT CALLED TO, READ BACK BY AND VERIFIED WITH: W BELIZAIRE,RN @0240  05/22/16 MKELLY,MLT    Klebsiella oxytoca NOT DETECTED NOT DETECTED Final   Klebsiella pneumoniae NOT DETECTED NOT DETECTED Final   Proteus species NOT DETECTED NOT DETECTED Final   Serratia marcescens NOT DETECTED NOT DETECTED Final   Carbapenem resistance NOT DETECTED NOT DETECTED Final   Haemophilus influenzae NOT DETECTED NOT DETECTED Final    Neisseria meningitidis NOT DETECTED NOT DETECTED Final   Pseudomonas aeruginosa NOT DETECTED NOT DETECTED Final   Candida albicans NOT DETECTED NOT DETECTED Final   Candida glabrata NOT DETECTED NOT DETECTED Final   Candida krusei NOT DETECTED NOT DETECTED Final   Candida parapsilosis NOT DETECTED NOT DETECTED Final   Candida tropicalis NOT DETECTED NOT DETECTED Final    Comment: Performed at [facility]  Blood Culture (routine x 2)     Status: None (Preliminary result)   Collection Time: 05/21/16  1:48 AM  Result Value Ref Range Status   Specimen Description BLOOD LEFT ANTECUBITAL  Final   Special Requests   Final    BOTTLES DRAWN AEROBIC AND ANAEROBIC AEB=12CC ANA=10CC   Culture  Setup Time   Final    GRAM NEGATIVE RODS RECOVERED FROM THE AEROBIC BOTTLE Gram Stain Report Called to,Read Back By and Verified With: COVINGTON,L. AT [id number] ON 05/21/2016 BY BAUGHAM,M. Performed at [place]  Final   Report Status PENDING  Incomplete  Urine culture     Status: None   Collection Time: 05/21/16  3:51 AM  Result Value Ref Range Status   Specimen Description URINE, CLEAN CATCH  Final   Special Requests NONE  Final   Culture NO GROWTH Performed at [facility]   Final   Report Status 05/22/2016 FINAL  Final         Radiology Studies: US Abdomen Complete  Result Date: 05/21/2016 CLINICAL DATA:  Abnormal CT.  Question cholecystitis. EXAM: ABDOMEN ULTRASOUND COMPLETE COMPARISON:  CT of earlier today FINDINGS: Gallbladder: Gallbladder sludge. Suspect a 12 mm stone. Gallbladder wall thickening at up to 11 mm. Small volume pericholecystic fluid. Sonographic Murphy's sign was not elicited. Common bile duct: Diameter: Upper normal for age, 8 mm. No intrahepatic ductal dilatation. Liver: Left hepatic lobe hyperechoic lesion is well-circumscribed and measures 1.1 cm. IVC: No abnormality visualized. Pancreas: Obscured by bowel gas. Spleen:  Size and appearance within normal limits. Right Kidney: Length: 13.0 cm. No hydronephrosis. A lower pole right renal 4.8 cm lesion appears minimally complex, but is fluid density on the CT. An interpolar central right renal 1.5 cm hypoechoic lesion likely corresponds to a fluid density lesion on image 42 of today's CT. Left Kidney: Length: 11.4 cm. Echogenicity within normal limits. No mass or hydronephrosis visualized.  Abdominal aorta: No aneurysm visualized. Other findings: No ascites. IMPRESSION: 1. Gallbladder sludge with possible stone. Wall thickening and pericholecystic fluid are suspicious for acute cholecystitis. No sonographic Murphy sign to confirm acute cholecystitis. 2. Upper normal common duct size.  Correlate with bilirubin levels. 3. Right renal lesions are likely cysts given CT appearance. Recommend attention on follow-up. 4. Hyperechoic left hepatic lobe lesion is favored to represent a hemangioma. Technically indeterminate. Consider follow-up with ultrasound at 6 months to confirm size stability. Alternatively, outpatient pre and post contrast abdominal MRI could confirm a benign hemangioma. Electronically Signed   By: Abigail Miyamoto M.D.   On: 05/21/2016 09:02        Scheduled Meds: . aspirin EC  81 mg Oral Daily  . atorvastatin  40 mg Oral QHS  . carbidopa-levodopa  1 tablet Oral TID  . chlorhexidine  15 mL Mouth Rinse BID  . heparin  5,000 Units Subcutaneous Q8H  . mouth rinse  15 mL Mouth Rinse q12n4p  . metoprolol  12.5 mg Oral BID  . pantoprazole  40 mg Oral Daily  . piperacillin-tazobactam (ZOSYN)  IV  3.375 g Intravenous Q8H  . sodium chloride flush  3 mL Intravenous Q12H   Continuous Infusions:   LOS: 2 days    Time spent: 25 minutes    Kathie Dike, MD [facility] If 7PM-7AM, please contact night-coverage [email or web address] Password [facility] 05/23/2016, 7:44 AM

## 2016-05-23 NOTE — Consult Note (Signed)
CARDIOLOGY CONSULT NOTE   Patient ID: ELO ROULAND MRN: [id number] DOB/AGE: Nov 04, 1930 82 y.o.  Admit Date: 05/21/2016 Referring Physician: Aviva Signs, MD Primary Physician: Kendrick Ranch, MD Consulting Cardiologist: Rozann Lesches MD Primary Cardiologist Lauree Chandler MD Reason for Consultation: Pre-Operative Evaluation  Clinical Summary Mr. Congrove is an 82 y.o.male with known history of coronary artery disease, atrial fibrillation post CABG in 2013, (not on anticoagulation), second surgery on 11/06/2011 for sternal dehiscence, hypertension, hyperlipidemia, history of Parkinson disease. The patient had sudden onset of abdominal pain with shaking 4 days ago. He is brought to the emergency room by his daughter.     On arrival to the emergency room he was found to be febrile with a temperature of 102.1, rectally, blood pressure 07/[birth year]1949, heart rate 111, O2 sat 94%, respirations 31.  Blood cultures were found to be,wille for Escherichia coli. Sodium 132, potassium 3.3, chloride 97, glucose 135, BUN 39, creatinine 1.65. Liver enzymes were markedly elevated at AST 418, ALT 20 and 20, alkaline phosphatase 398, bili total 4.2. There was evidence of leukocytosis with white blood cell elevation to 15,000, he was mildly anemic with a hemoglobin of 11.8, hematocrit 36.3. Platelets 227.    CT scan revealed evidence of acute cholecystitis, cholelithiasis, but a normal caliber common bile duct. He was also noted per Dr. Arnoldo Morale, that he was found to have an abnormal hypodense lesion of 1.4 cm located on his liver. Patient had elevated AST and ALTs. He had acute leukocytosis. Chest x-ray was negative for CHF and pneumonia or acute cardiopulmonary abnormalities. EKG revealed normal sinus rhythm with frequent PVCs, tachycardic at 106 bpm. No acute ST-T wave abnormalities.      He was treated with IV fluids, started on piperacillin-tazobactam, vancomycin, given Tylenol suppository,  metronidazole and morphine for pain.        Due to presence of cardiac history, we are asked for preoperative evaluation. The patient was last seen by Dr. Angelena Form on 07/11/2015. At that time he was maintaining normal sinus rhythm, he was asymptomatic and clinically stable from cardiac standpoint.  No Known Allergies  Medications Scheduled Medications: . aspirin EC  81 mg Oral Daily  . atorvastatin  40 mg Oral QHS  . carbidopa-levodopa  1 tablet Oral TID  . chlorhexidine  15 mL Mouth Rinse BID  . heparin  5,000 Units Subcutaneous Q8H  . mouth rinse  15 mL Mouth Rinse q12n4p  . metoprolol  12.5 mg Oral BID  . pantoprazole  40 mg Oral Daily  . piperacillin-tazobactam (ZOSYN)  IV  3.375 g Intravenous Q8H  . sodium chloride flush  3 mL Intravenous Q12H     PRN Medications: acetaminophen, HYDROmorphone (DILAUDID) injection, ondansetron **OR** ondansetron (ZOFRAN) IV   Past Medical History:  Diagnosis Date  . Arthritis   . Coronary artery disease    Multivessel s/p remote PCI and CABG 2013  . Essential hypertension   . Hypercholesteremia   . Myocardial infarct   . Parkinson disease [facility])     Past Surgical History:  Procedure Laterality Date  . CORONARY ARTERY BYPASS GRAFT  11/01/2011   Procedure: CORONARY ARTERY BYPASS GRAFTING (CABG);  Surgeon: Melrose Nakayama, MD;  Location: [place];  Service: Open Heart Surgery;  Laterality: N/A;  Coronary Artery bypass graft times four utilizing the left internal mammary artery and the right greater saphenous vein harvested endoscopically.  Marland Kitchen HEMORROIDECTOMY    . LEFT HEART CATHETERIZATION WITH CORONARY ANGIOGRAM N/A 10/28/2011   Procedure: LEFT  HEART CATHETERIZATION WITH CORONARY ANGIOGRAM;  Surgeon: Burnell Blanks, MD;  Location[facility] C[facility] LAB;  Service: Cardiovascular;  Laterality: N/A;  . PERCUTANEOUS CORONARY INTERVENTION-BALLOON ONLY  10/28/2011   Procedure: PERCUTANEOUS CORONARY INTERVENTION-BALLOON ONLY;  Surgeon: Burnell Blanks, MD;  Location: [facility] CATH LAB;  Service: Cardiovascular;;  . STERNAL INCISION RECLOSURE  11/06/2011   Procedure: STERNAL REWIRING;  Surgeon: Melrose Nakayama, MD;  Location: [place];  Service: Open Heart Surgery;  Laterality: N/A;    Family History  Problem Relation Age of Onset  . Hypertension Father   . Colon cancer Neg Hx      Social History Mr. Vantassell reports that he quit smoking about 44 years ago. His smoking use included Cigarettes. He has never used smokeless tobacco. Mr. Knaff reports that he does not drink alcohol.  Review of Systems Complete review of systems are found to be negative unless outlined in H&P above.  Physical Examination Blood pressure (!) 126/55, pulse 83, temperature 98.4 F (36.9 C), temperature source Oral, resp. rate 20, height 5\' 9"  (1.753 m), weight 226 lb 13.7 oz (102.9 kg), SpO2 97 %.  Intake/Output Summary (Last 24 hours) at 05/23/16 1035 Last data filed at 05/23/16 0606  Gross per 24 hour  Intake              120 ml  Output              950 ml  Net             -830 ml    Telemetry: Normal sinus rhythm heart rate in the 70s and 80s at rest.  GEN: Ill-appearing no acute distress, poor historian. HEENT: Conjunctiva icteric and lids normal, oropharynx clear. Neck: Supple, no elevated JVP or carotid bruits, no thyromegaly. Lungs: Clear to auscultation, nonlabored breathing at rest. Cardiac: Regular rate and rhythm, no S3 or significant systolic murmur, no pericardial rub. Abdomen: Soft, tenderness is noted on palpation no hepatomegaly, bowel sounds present, no guarding or rebound. Extremities: No pitting edema, distal pulses 2+. Skin: Warm and dry. Musculoskeletal: No kyphosis. Neuropsychiatric: Alert and oriented x3, flat affect   Prior Cardiac Testing/Procedures 1.Echocardiogram 10/26/2011 Left ventricle: The cavity size was normal. Wall thickness was increased in a pattern of mild LVH. Systolic function was normal. The estimated  ejection fraction was in the range of 60% to 65%. Possible mild hypokinesis of the mid-distalanteroseptal myocardium. - Left atrium: The atrium was mildly dilated. - Atrial septum: No defect or patent foramen ovale was identified.  2. Cardiac Cath 10/28/2011  Left main:  Diffuse calcification with mild diffuse plaque.   Left Anterior Descending Artery: Large caliber vessel that courses to the apex. There is a large caliber septal perforating branch. The proximal vessel has diffuse 40% stenosis. The mid vessel has an aneurysmal segment followed by a total occlusion of the mid LAD. The moderate sized diagonal branch arises just at the occlusion of the mid LAD. The diagonal branch has an ostial 40% stenosis and mild plaque in mid vessel. The mid and distal LAD fills slowly from left to left collaterals.   Circumflex Artery: Large caliber vessel. The first OM branch is moderate sized and has an ostial 90% stenosis. The second OM is moderate sized and has 40% proximal stenosis. The AV groove Circumflex has mild plaque disease but no flow limiting lesions.   Right Coronary Artery:  Large dominant vessel with 60% serial lesions in the proximal vessel. The mid vessel has a stented  segment with 100% occlusion within the stented segment. The mid and distal vessel fills from left to right collaterals.   Left Ventricular Angiogram: No LV gram. (LVEF preserved by Echo)  Impression: 1. Severe triple vessel CAD 2. NSTEMI 3. Preserved LV systolic function 4. Unsuccessful attempt at angioplasty of the occluded mid LAD  3. Four-vessel coronary artery bypass grafting: 11/01/2011  Median sternotomy, extra corporeal circulation, coronary artery bypass grafting 4 (left internal mammary artery to left anterior descending, saphenous vein graft to posterior descending, saphenous vein graft to first diagonal, saphenous vein graft to obtuse marginal one, endoscopic vein harvest-right leg. Dr. Erasmo Leventhal M.D.  Lab Results  Basic Metabolic Panel:  Recent Labs Lab 05/21/16 0137 05/22/16 0617 05/23/16 0615  NA 132* 136 137  K 3.3* 3.9 3.9  CL 97* 106 106  CO2 24 22 25   GLUCOSE 135* 88 87  BUN 39* 26* 24*  CREATININE 1.65* 1.31* 1.21  CALCIUM 8.3* 7.9* 8.0*    Liver Function Tests:  Recent Labs Lab 05/21/16 0137 05/21/16 0203 05/22/16 0617 05/23/16 0621  AST 418*  --  199* 112*  ALT 220*  --  54 76*  ALKPHOS 398*  --  326* 291*  BILITOT 4.2* 4.4* 3.6* 1.7*  PROT 6.8  --  5.7* 5.6*  ALBUMIN 2.8*  --  2.2* 2.0*    CBC:  Recent Labs Lab 05/21/16 0137 05/22/16 0617 05/23/16 0615  WBC 15.0* 18.7* 15.2*  NEUTROABS 13.5*  --   --   HGB 11.8* 10.6* 10.6*  HCT 36.3* 34.1* 34.3*  MCV 75.2* 76.3* 76.2*  PLT 227 226 240     ECG: Normal sinus rhythm with frequent PVCs, heart or 106 bpm.   Impression and Recommendations  1. Coronary artery disease: History of four-vessel coronary artery bypass 2013: Has been followed in [facility] by cardiology with last office visit in January 2017. At that time he was clinically stable and asymptomatic. He denies chest pain or dyspnea currently. We'll plan echocardiogram for evaluation of LV function. Unless there are significant changes in his LV function compared to 2013, he should not require any further ischemic workup.  He should continue his metoprolol 12.5 mg twice a day and statin. Can hold ASA if needed. He was taken off of ACE inhibitor due to hypotension.  2. History of postoperative atrial fibrillation after CABG: Currently remains in normal sinus rhythm.   3. Acute cholelithiasis with cholecystitis: Being  followed by Dr. Aviva Signs with planned surgery in a.m.  4. Bacteremia: Positive blood cultures for Escherichia coli. On vancomycin, Zosyn, followed by hospitalist service.  5. Parkinson's disease: Remains on Sinemet IR.  Signed:Kathryn M. Lawrence NP [place]   05/23/2016, 10:35 AM Co-Sign  MD  Attending note:  Patient seen and examined. Reviewed records and updated the chart. Modified above note by Ms. Lawrence NP. Mr. Haw presents with acute cholecystitis, has been evaluated by Dr. Arnoldo Morale with plan for laparoscopic cholecystectomy. Mr. Chakraborty follows with Dr. Angelena Form with a history of multivessel CAD status post CABG in 2013, postoperative atrial fibrillation without recurrence and not anticoagulated. He has done fairly well over the last few years, does not report any angina symptoms or nitroglycerin use. He does have Parkinson's disease and provides somewhat limited history.  On examination heart rate is in the 80s in sinus rhythm by telemetry. Systolic blood pressure in the 120s. He is currently afebrile on broad-spectrum antibiotics for dyscrasia coli bacteremia in association with his cholecystitis. Lungs exhibit decreased  breath sounds without crackles. Cardiac exam reveals RRR with soft systolic murmur, no gallop. Abdomen is mildly tender to palpation without guarding. Extremities exhibit no pitting edema. Lab work shows creatinine 1.2, AST 112, ALT 76, hemoglobin 10.6, W BC 15.2, platelets 240. ECG showed sinus tachycardia with occasional PVCs.  Preoperative evaluation in an 82 year old male with multivessel CAD status post CABG in 2013, no active angina symptoms or obvious heart failure symptoms. LVEF was normal range as of 2013. He is being considered for laparoscopic cholecystectomy. Outpatient regimen includes Lopressor, Altace, aspirin, and Lipitor. Plan to obtain an echocardiogram to ensure stability in LVEF. Unless there have been significant changes, he should not require any further ischemic workup and should be able to proceed with planned surgery at overall low to intermediate perioperative cardiac risk.  Satira Sark, M.D., F.A.C.C.

## 2016-05-24 ENCOUNTER — Inpatient Hospital Stay (HOSPITAL_COMMUNITY): Payer: Medicare PPO | Admitting: Anesthesiology

## 2016-05-24 ENCOUNTER — Encounter (HOSPITAL_COMMUNITY): Payer: Self-pay | Admitting: *Deleted

## 2016-05-24 ENCOUNTER — Encounter (HOSPITAL_COMMUNITY)
Admission: EM | Disposition: A | Discharge status: Skilled Nursing Facility | Payer: Self-pay | Source: Home / Self Care | Attending: Internal Medicine

## 2016-05-24 ENCOUNTER — Inpatient Hospital Stay (HOSPITAL_COMMUNITY): Payer: Medicare PPO

## 2016-05-24 DIAGNOSIS — I2510 Atherosclerotic heart disease of native coronary artery without angina pectoris: Secondary | ICD-10-CM

## 2016-05-24 HISTORY — PX: CHOLECYSTECTOMY: SHX55

## 2016-05-24 LAB — CBC WITH DIFFERENTIAL/PLATELET
Basophils Absolute: 0 K/uL (ref 0.0–0.1)
Basophils Relative: 0 %
Eosinophils Absolute: 0 K/uL (ref 0.0–0.7)
Eosinophils Relative: 0 %
HCT: 31.5 % — ABNORMAL LOW (ref 39.0–52.0)
Hemoglobin: 9.7 g/dL — ABNORMAL LOW (ref 13.0–17.0)
Lymphocytes Relative: 3 %
Lymphs Abs: 1.1 K/uL (ref 0.7–4.0)
MCH: 23.6 pg — ABNORMAL LOW (ref 26.0–34.0)
MCHC: 30.8 g/dL (ref 30.0–36.0)
MCV: 76.6 fL — ABNORMAL LOW (ref 78.0–100.0)
Monocytes Absolute: 1.4 K/uL — ABNORMAL HIGH (ref 0.1–1.0)
Monocytes Relative: 5 %
Neutro Abs: 29.3 K/uL — ABNORMAL HIGH (ref 1.7–7.7)
Neutrophils Relative %: 92 %
Platelets: 304 K/uL (ref 150–400)
RBC: 4.11 MIL/uL — ABNORMAL LOW (ref 4.22–5.81)
RDW: 17.4 % — ABNORMAL HIGH (ref 11.5–15.5)
WBC: 31.9 K/uL — ABNORMAL HIGH (ref 4.0–10.5)

## 2016-05-24 LAB — BASIC METABOLIC PANEL
ANION GAP: 8 (ref 5–15)
BUN: 19 mg/dL (ref 6–20)
CHLORIDE: 104 mmol/L (ref 101–111)
CO2: 25 mmol/L (ref 22–32)
CREATININE: 1.16 mg/dL (ref 0.61–1.24)
Calcium: 8.2 mg/dL — ABNORMAL LOW (ref 8.9–10.3)
GFR calc non Af Amer: 56 mL/min — ABNORMAL LOW (ref >60)
Glucose, Bld: 91 mg/dL (ref 65–99)
POTASSIUM: 4 mmol/L (ref 3.5–5.1)
SODIUM: 137 mmol/L (ref 135–145)

## 2016-05-24 LAB — CBC
HCT: 36.6 % — ABNORMAL LOW (ref 39.0–52.0)
HEMOGLOBIN: 11.5 g/dL — AB (ref 13.0–17.0)
MCH: 24 pg — ABNORMAL LOW (ref 26.0–34.0)
MCHC: 31.4 g/dL (ref 30.0–36.0)
MCV: 76.3 fL — AB (ref 78.0–100.0)
PLATELETS: 264 10*3/uL (ref 150–400)
RBC: 4.8 MIL/uL (ref 4.22–5.81)
RDW: 17.5 % — ABNORMAL HIGH (ref 11.5–15.5)
WBC: 15 10*3/uL — AB (ref 4.0–10.5)

## 2016-05-24 LAB — PREPARE RBC (CROSSMATCH)

## 2016-05-24 LAB — ABO/RH: ABO/RH(D): A POS

## 2016-05-24 SURGERY — LAPAROSCOPIC CHOLECYSTECTOMY
Anesthesia: General | Site: Abdomen

## 2016-05-24 MED ORDER — HEMOSTATIC AGENTS (NO CHARGE) OPTIME
TOPICAL | Status: DC | PRN
  Administered 2016-05-24: 2 via TOPICAL

## 2016-05-24 MED ORDER — PROPOFOL 10 MG/ML IV BOLUS
INTRAVENOUS | Status: DC | PRN
  Administered 2016-05-24: 120 mg via INTRAVENOUS

## 2016-05-24 MED ORDER — DEXAMETHASONE SODIUM PHOSPHATE 4 MG/ML IJ SOLN
4.0000 mg | Freq: Once | INTRAMUSCULAR | Status: AC
  Administered 2016-05-24: 4 mg via INTRAVENOUS

## 2016-05-24 MED ORDER — GLYCOPYRROLATE 0.2 MG/ML IJ SOLN
INTRAMUSCULAR | Status: DC | PRN
Start: 2016-05-24 — End: 2016-05-24
  Administered 2016-05-24: 0.6 mg via INTRAVENOUS

## 2016-05-24 MED ORDER — ONDANSETRON HCL 4 MG/2ML IJ SOLN
INTRAMUSCULAR | Status: AC
  Filled 2016-05-24: qty 2

## 2016-05-24 MED ORDER — NEOSTIGMINE METHYLSULFATE 10 MG/10ML IV SOLN
INTRAVENOUS | Status: AC
  Filled 2016-05-24: qty 1

## 2016-05-24 MED ORDER — SODIUM CHLORIDE 0.9 % IV SOLN
Freq: Once | INTRAVENOUS | Status: AC
  Administered 2016-05-26: 10:00:00 via INTRAVENOUS

## 2016-05-24 MED ORDER — FENTANYL CITRATE (PF) 100 MCG/2ML IJ SOLN: 25.0000 ug | INTRAMUSCULAR | Status: DC | PRN

## 2016-05-24 MED ORDER — NEOSTIGMINE METHYLSULFATE 10 MG/10ML IV SOLN
INTRAVENOUS | Status: DC | PRN
  Administered 2016-05-24: 2 mg via INTRAVENOUS
  Administered 2016-05-24: 1 mg via INTRAVENOUS

## 2016-05-24 MED ORDER — IOPAMIDOL (ISOVUE-300) INJECTION 61%
INTRAVENOUS | Status: AC
  Filled 2016-05-24: qty 50

## 2016-05-24 MED ORDER — FENTANYL CITRATE (PF) 250 MCG/5ML IJ SOLN
INTRAMUSCULAR | Status: AC
  Filled 2016-05-24: qty 5

## 2016-05-24 MED ORDER — SODIUM CHLORIDE 0.9 % IV SOLN
INTRAVENOUS | Status: DC
  Administered 2016-05-24: 21:00:00 via INTRAVENOUS

## 2016-05-24 MED ORDER — LIDOCAINE HCL (PF) 1 % IJ SOLN
INTRAMUSCULAR | Status: AC
  Filled 2016-05-24: qty 5

## 2016-05-24 MED ORDER — SODIUM CHLORIDE 0.9 % IV BOLUS (SEPSIS)
500.0000 mL | Freq: Once | INTRAVENOUS | Status: AC
  Administered 2016-05-24: 500 mL via INTRAVENOUS

## 2016-05-24 MED ORDER — CHLORHEXIDINE GLUCONATE CLOTH 2 % EX PADS: 6.0000 | MEDICATED_PAD | Freq: Once | CUTANEOUS | Status: DC

## 2016-05-24 MED ORDER — LACTATED RINGERS IV SOLN
INTRAVENOUS | Status: DC
  Administered 2016-05-24: 10:00:00 via INTRAVENOUS

## 2016-05-24 MED ORDER — DEXAMETHASONE SODIUM PHOSPHATE 4 MG/ML IJ SOLN
INTRAMUSCULAR | Status: AC
  Filled 2016-05-24: qty 1

## 2016-05-24 MED ORDER — SUCCINYLCHOLINE CHLORIDE 20 MG/ML IJ SOLN
INTRAMUSCULAR | Status: AC
  Filled 2016-05-24: qty 1

## 2016-05-24 MED ORDER — SIMETHICONE 80 MG PO CHEW
40.0000 mg | CHEWABLE_TABLET | Freq: Four times a day (QID) | ORAL | Status: DC | PRN
  Administered 2016-05-25: 40 mg via ORAL
  Filled 2016-05-24: qty 1

## 2016-05-24 MED ORDER — FENTANYL CITRATE (PF) 100 MCG/2ML IJ SOLN
INTRAMUSCULAR | Status: DC | PRN
  Administered 2016-05-24 (×3): 50 ug via INTRAVENOUS

## 2016-05-24 MED ORDER — ROCURONIUM BROMIDE 100 MG/10ML IV SOLN
INTRAVENOUS | Status: DC | PRN
  Administered 2016-05-24: 25 mg via INTRAVENOUS
  Administered 2016-05-24: 5 mg via INTRAVENOUS

## 2016-05-24 MED ORDER — ROCURONIUM BROMIDE 50 MG/5ML IV SOLN
INTRAVENOUS | Status: AC
  Filled 2016-05-24: qty 1

## 2016-05-24 MED ORDER — LACTATED RINGERS IV SOLN
INTRAVENOUS | Status: DC
  Administered 2016-05-24: 14:00:00 via INTRAVENOUS

## 2016-05-24 MED ORDER — HEMOSTATIC AGENTS (NO CHARGE) OPTIME
TOPICAL | Status: DC | PRN
  Administered 2016-05-24: 1 via TOPICAL

## 2016-05-24 MED ORDER — SUCCINYLCHOLINE CHLORIDE 20 MG/ML IJ SOLN
INTRAMUSCULAR | Status: DC | PRN
  Administered 2016-05-24: 140 mg via INTRAVENOUS

## 2016-05-24 MED ORDER — GLYCOPYRROLATE 0.2 MG/ML IJ SOLN
INTRAMUSCULAR | Status: AC
  Filled 2016-05-24: qty 3

## 2016-05-24 MED ORDER — 0.9 % SODIUM CHLORIDE (POUR BTL) OPTIME
TOPICAL | Status: DC | PRN
  Administered 2016-05-24: 1000 mL

## 2016-05-24 MED ORDER — PROPOFOL 10 MG/ML IV BOLUS
INTRAVENOUS | Status: AC
  Filled 2016-05-24: qty 20

## 2016-05-24 MED ORDER — POVIDONE-IODINE 10 % EX OINT
TOPICAL_OINTMENT | CUTANEOUS | Status: AC
  Filled 2016-05-24: qty 1

## 2016-05-24 MED ORDER — BUPIVACAINE HCL 0.5 % IJ SOLN
INTRAMUSCULAR | Status: DC | PRN
  Administered 2016-05-24: 10 mL

## 2016-05-24 MED ORDER — HEMOSTATIC AGENTS (NO CHARGE) OPTIME
TOPICAL | Status: DC | PRN
  Administered 2016-05-24: 3 via TOPICAL

## 2016-05-24 MED ORDER — LORAZEPAM 2 MG/ML IJ SOLN
0.5000 mg | INTRAMUSCULAR | Status: DC | PRN
  Administered 2016-05-28: 0.5 mg via INTRAVENOUS
  Filled 2016-05-24: qty 1

## 2016-05-24 MED ORDER — BUPIVACAINE HCL (PF) 0.5 % IJ SOLN
INTRAMUSCULAR | Status: AC
  Filled 2016-05-24: qty 30

## 2016-05-24 MED ORDER — ENOXAPARIN SODIUM 40 MG/0.4ML ~~LOC~~ SOLN: 40.0000 mg | SUBCUTANEOUS | Status: DC

## 2016-05-24 MED ORDER — MIDAZOLAM HCL 2 MG/2ML IJ SOLN
1.0000 mg | INTRAMUSCULAR | Status: DC | PRN
  Administered 2016-05-24: 2 mg via INTRAVENOUS

## 2016-05-24 MED ORDER — LIDOCAINE HCL 1 % IJ SOLN
INTRAMUSCULAR | Status: DC | PRN
  Administered 2016-05-24: 30 mg via INTRADERMAL

## 2016-05-24 MED ORDER — MIDAZOLAM HCL 2 MG/2ML IJ SOLN
INTRAMUSCULAR | Status: AC
  Filled 2016-05-24: qty 2

## 2016-05-24 MED ORDER — ONDANSETRON HCL 4 MG/2ML IJ SOLN
4.0000 mg | Freq: Once | INTRAMUSCULAR | Status: AC
  Administered 2016-05-24: 4 mg via INTRAVENOUS

## 2016-05-24 MED ORDER — ETOMIDATE 2 MG/ML IV SOLN
INTRAVENOUS | Status: AC
  Filled 2016-05-24: qty 10

## 2016-05-24 SURGICAL SUPPLY — 54 items
APPLICATOR ARISTA FLEXITIP XL (MISCELLANEOUS) ×4 IMPLANT
APPLIER CLIP LAPSCP 10X32 DD (CLIP) ×4 IMPLANT
BAG HAMPER (MISCELLANEOUS) ×4 IMPLANT
CATH CHOLANGIOGRAM 4.5FR (CATHETERS) ×4 IMPLANT
CHLORAPREP W/TINT 26ML (MISCELLANEOUS) ×4 IMPLANT
CLOTH BEACON ORANGE TIMEOUT ST (SAFETY) ×4 IMPLANT
COVER LIGHT HANDLE STERIS (MISCELLANEOUS) ×8 IMPLANT
COVER MAYO STAND XLG (DRAPE) ×4 IMPLANT
DECANTER SPIKE VIAL GLASS SM (MISCELLANEOUS) ×8 IMPLANT
DISSECTOR BLUNT TIP ENDO 5MM (MISCELLANEOUS) IMPLANT
DRAPE C-ARM FOLDED MOBILE STRL (DRAPES) ×4 IMPLANT
ELECT REM PT RETURN 9FT ADLT (ELECTROSURGICAL) ×4
ELECTRODE REM PT RTRN 9FT ADLT (ELECTROSURGICAL) ×2 IMPLANT
FILTER SMOKE EVAC LAPAROSHD (FILTER) ×4 IMPLANT
FORMALIN 10 PREFIL 120ML (MISCELLANEOUS) ×4 IMPLANT
GLOVE BIOGEL PI IND STRL 7.0 (GLOVE) ×2 IMPLANT
GLOVE BIOGEL PI INDICATOR 7.0 (GLOVE) ×2
GLOVE EXAM NITRILE MD LF STRL (GLOVE) ×4 IMPLANT
GLOVE INDICATOR 6.5 STRL GRN (GLOVE) ×4 IMPLANT
GLOVE SURG SS PI 6.5 STRL IVOR (GLOVE) ×4 IMPLANT
GLOVE SURG SS PI 7.5 STRL IVOR (GLOVE) ×8 IMPLANT
GOWN STRL REUS W/ TWL LRG LVL3 (GOWN DISPOSABLE) ×6 IMPLANT
GOWN STRL REUS W/ TWL XL LVL3 (GOWN DISPOSABLE) ×2 IMPLANT
GOWN STRL REUS W/TWL LRG LVL3 (GOWN DISPOSABLE) ×6
GOWN STRL REUS W/TWL XL LVL3 (GOWN DISPOSABLE) ×2
HEMOSTAT ARISTA ABSORB 3G PWDR (MISCELLANEOUS) ×8 IMPLANT
HEMOSTAT SNOW SURGICEL 2X4 (HEMOSTASIS) ×12 IMPLANT
HEMOSTAT SURGICEL 4X8 (HEMOSTASIS) ×4 IMPLANT
INST SET LAPROSCOPIC AP (KITS) ×4 IMPLANT
IV NS IRRIG 3000ML ARTHROMATIC (IV SOLUTION) ×4 IMPLANT
KIT ROOM TURNOVER APOR (KITS) ×4 IMPLANT
MANIFOLD NEPTUNE II (INSTRUMENTS) ×4 IMPLANT
NEEDLE INSUFFLATION 120MM (ENDOMECHANICALS) ×4 IMPLANT
NS IRRIG 1000ML POUR BTL (IV SOLUTION) ×4 IMPLANT
PACK LAP CHOLE LZT030E (CUSTOM PROCEDURE TRAY) ×4 IMPLANT
PAD ARMBOARD 7.5X6 YLW CONV (MISCELLANEOUS) ×4 IMPLANT
POUCH SPECIMEN RETRIEVAL 10MM (ENDOMECHANICALS) ×4 IMPLANT
SET BASIN LINEN APH (SET/KITS/TRAYS/PACK) ×4 IMPLANT
SET TUBE IRRIG SUCTION NO TIP (IRRIGATION / IRRIGATOR) ×4 IMPLANT
SLEEVE ENDOPATH XCEL 5M (ENDOMECHANICALS) ×4 IMPLANT
SPONGE GAUZE 2X2 8PLY STER LF (GAUZE/BANDAGES/DRESSINGS) ×4
SPONGE GAUZE 2X2 8PLY STRL LF (GAUZE/BANDAGES/DRESSINGS) ×12 IMPLANT
STAPLER VISISTAT (STAPLE) ×4 IMPLANT
SUT VICRYL 0 UR6 27IN ABS (SUTURE) ×4 IMPLANT
SYR 20CC LL (SYRINGE) ×4 IMPLANT
SYR 30ML LL (SYRINGE) ×4 IMPLANT
SYR CONTROL 10ML LL (SYRINGE) ×4 IMPLANT
TAPE CLOTH SURG 4X10 WHT LF (GAUZE/BANDAGES/DRESSINGS) ×4 IMPLANT
TROCAR ENDO BLADELESS 11MM (ENDOMECHANICALS) ×4 IMPLANT
TROCAR XCEL NON-BLD 5MMX100MML (ENDOMECHANICALS) ×4 IMPLANT
TROCAR XCEL UNIV SLVE 11M 100M (ENDOMECHANICALS) ×4 IMPLANT
TUBING INSUFFLATION (TUBING) ×4 IMPLANT
WARMER LAPAROSCOPE (MISCELLANEOUS) ×4 IMPLANT
YANKAUER SUCT 12FT TUBE ARGYLE (SUCTIONS) ×4 IMPLANT

## 2016-05-24 NOTE — Transfer of Care (Signed)
Immediate Anesthesia Transfer of Care Note  Patient: Jesus Maldonado  Procedure(s) Performed: Procedure(s): LAPAROSCOPIC CHOLECYSTECTOMY (N/A)  Patient Location: PACU  Anesthesia Type:General  Level of Consciousness: sedated  Airway & Oxygen Therapy: Patient Spontanous Breathing and Patient connected to face mask oxygen  Post-op Assessment: Report given to RN and Post -op Vital signs reviewed and stable  Post vital signs: Reviewed and stable  Last Vitals:  Vitals:   05/24/16 1050 05/24/16 1055  BP: (!) 145/77 (!) 147/79  Pulse:    Resp: (!) 24 (!) 34  Temp:      Last Pain:  Vitals:   05/24/16 1026  TempSrc: Oral  PainSc: 0-No pain      Patients Stated Pain Goal: 4 ([id number])  Complications: No apparent anesthesia complications

## 2016-05-24 NOTE — Progress Notes (Signed)
eLink Physician-Brief Progress Note Patient Name: Jesus Maldonado DOB: August 09, 1930 MRN: [id number]   Date of Service  05/24/2016  HPI/Events of Note  Notified by the bedside nurse of borderline hypertension and tachycardia. Patient responded to 500 mL bolus of normal saline with improving blood pressure and decreasing heart rate. Patient's hemoglobin noted to be 9.7 and type and screen performed. Patient currently extubated. Requiring minimal oxygen support.   eICU Interventions  1. CBC already ordered for a.m. to trend hemoglobin 2. Administering second 500 mL bolus of normal saline over 30 minutes 3. Continuing close monitoring      Intervention Category Intermediate Interventions: Hypotension - evaluation and management  Tera Partridge 05/24/2016, 10:54 PM

## 2016-05-24 NOTE — Progress Notes (Signed)
Relay to night RN that she needed to call report to ICU. ICU does not know who is receiving patient. Oswald Hillock, RN

## 2016-05-24 NOTE — Progress Notes (Signed)
PROGRESS NOTE    Jesus Maldonado  [id number] DOB: [birth year] DOA: 05/21/2016 PCP: Kendrick Ranch, MD    Brief Narrative:  3 yom with a hx of CAD s/p CABG x4. A-fib not on any anticoagulation, HTN, HLD, and parkinson disease, presented with complaints of abdominal pain. While in the ED, serology shows creatine 1.65, total bili 4.2, WBC 15.0, and hemoglobin 11.8. Abdominal CT is consistent with cholecystitis. General surgery and GI was consulted and he was admitted for further evaluation of acute cholecystitis.    Assessment & Plan:   Principal Problem:   Acute cholecystitis Active Problems:   HTN (hypertension)   Hyperlipidemia with target low density lipoprotein (LDL) cholesterol less than 70 mg/dL   CAD (coronary artery disease)   Atrial fibrillation (HCC)   Cholelithiasis   Elevated LFTs   AKI (acute kidney injury) ([place])   Hypokalemia   Sepsis ([place])   E coli bacteremia  1. Acute cholecystitis. Abdominal CT was consistent with cholecystitis. Since admission, his symptoms have started to improve. LFTs are trending down indicatingPossible calculus has likely passed. Continue broad spectrum antibiotics. Continue Protonix, and analgesic.GI and general surgery following. Patient underwent laparoscopic cholecystectomy today. Further postoperative care per general surgery. 2. Escherichia coli sepsis. Blood cultures positive for Escherichia coli, likely from biliary source. Urine cultures show no growth. Continue Rocephin.  3. HTN. Continue to hold ramipril due to soft pressuresand renal failure. Continue metoprolol with holding parameters.  4. AKI. related to volume depletion and sepsis. Creatinine continues to improve with IV fluids. Continue to monitor 5. A-fib. Patient is not anticoagulated on any medication due to fall risk. Rate is controlled. Continue beta-blocker.  6. Sinus tachycardia. Patient noted to have significant sinus tachycardia with heart rate in the 120s  to 130s. Blood pressure was noted to be in the high 80s. Hypotension has improved with IV fluids, but he remains tachycardic. Hemoglobin was rechecked and noted to have a mild decline since surgery, but no indication for transfusion at this point. Continue to monitor closely 7. Lethargy. Likely related to hypotension as well as residual effect of anesthesia. Mental status is improving with improvement of blood pressure. Continue to monitor. 8. CAD hx. S/p 4 CABGs. Aspirin currently on hold.  9. HLD. Continue statin. 10. Anasarca. Patient has been receiving IV fluids and is noted to be hypoalbuminemic likely leading to third spacing of IV fluids. Will need to be cautious with further IV fluids.  DVT prophylaxis: Heparin  Code Status: Full  Family Communication: daughter at bedside Disposition Plan: transfer to SDU for closer monitoring .   Consultants:   General surgery   GI  Procedures:   None   Antimicrobials:   Vancomycin 11/14 >>11/15  Zosyn 11/14>>11/16  Rocephin 11/16>>   Subjective: Patient is lethargic today. He is seen in room post operatively.  Objective: Vitals:   05/22/16 1557 05/22/16 2105 05/23/16 0606 05/23/16 2226  BP: (!) 124/57 133/61 (!) 126/55 (!) 159/70  Pulse: 76 76 83 76  Resp: 18 20 20 20   Temp: 98.3 F (36.8 C) 98.4 F (36.9 C) 98.4 F (36.9 C) 98.1 F (36.7 C)  TempSrc: Oral Axillary Oral Oral  SpO2: 99% 100% 97% 97%  Weight:      Height:        Intake/Output Summary (Last 24 hours) at 05/24/16 0757 Last data filed at 05/24/16 0500  Gross per 24 hour  Intake              290  ml  Output              350 ml  Net              -60 ml   Filed Weights   05/21/16 0123 05/21/16 0639  Weight: 99.8 kg (220 lb) 102.9 kg (226 lb 13.7 oz)    Examination:  General exam: Appears lethargic  Respiratory system: Clear to auscultation. Respiratory effort normal. Cardiovascular system: S1 & S2 heard, tachycardic. No JVD, murmurs, rubs, gallops  or clicks. 1+ pedal edema. Gastrointestinal system: Abdomen is nondistended, soft and diffusely tender. No organomegaly or masses felt. Normal bowel sounds heard. Central nervous system: somnolent No focal neurological deficits. Extremities: Symmetric 5 x 5 power. Skin: No rashes, lesions or ulcers Psychiatry: cannot assess.     Data Reviewed: I have personally reviewed following labs and imaging studies  CBC:  Recent Labs Lab 05/21/16 0137 05/22/16 0617 05/23/16 0615 05/24/16 0612  WBC 15.0* 18.7* 15.2* 15.0*  NEUTROABS 13.5*  --   --   --   HGB 11.8* 10.6* 10.6* 11.5*  HCT 36.3* 34.1* 34.3* 36.6*  MCV 75.2* 76.3* 76.2* 76.3*  PLT 227 226 240 [id number]   Basic Metabolic Panel:  Recent Labs Lab 05/21/16 0137 05/22/16 0617 05/23/16 0615  NA 132* 136 137  K 3.3* 3.9 3.9  CL 97* 106 106  CO2 24 22 25   GLUCOSE 135* 88 87  BUN 39* 26* 24*  CREATININE 1.65* 1.31* 1.21  CALCIUM 8.3* 7.9* 8.0*   GFR: Estimated Creatinine Clearance: 54.7 mL/min (by C-G formula based on SCr of 1.21 mg/dL). Liver Function Tests:  Recent Labs Lab 05/21/16 0137 05/21/16 0203 05/22/16 0617 05/23/16 0621  AST 418*  --  199* 112*  ALT 220*  --  54 76*  ALKPHOS 398*  --  326* 291*  BILITOT 4.2* 4.4* 3.6* 1.7*  PROT 6.8  --  5.7* 5.6*  ALBUMIN 2.8*  --  2.2* 2.0*    Recent Labs Lab 05/21/16 0137  LIPASE 31   No results for input(s): AMMONIA in the last 168 hours. Coagulation Profile: No results for input(s): INR, PROTIME in the last 168 hours. Cardiac Enzymes: No results for input(s): CKTOTAL, CKMB, CKMBINDEX, TROPONINI in the last 168 hours. BNP (last 3 results) No results for input(s): PROBNP in the last 8760 hours. HbA1C: No results for input(s): HGBA1C in the last 72 hours. CBG: No results for input(s): GLUCAP in the last 168 hours. Lipid Profile: No results for input(s): CHOL, HDL, LDLCALC, TRIG, CHOLHDL, LDLDIRECT in the last 72 hours. Thyroid Function Tests: No results  for input(s): TSH, T4TOTAL, FREET4, T3FREE, THYROIDAB in the last 72 hours. Anemia Panel: No results for input(s): VITAMINB12, FOLATE, FERRITIN, TIBC, IRON, RETICCTPCT in the last 72 hours. Sepsis Labs:  Recent Labs Lab 05/21/16 0153  LATICACIDVEN 1.80    Recent Results (from the past 240 hour(s))  Blood Culture (routine x 2)     Status: Abnormal   Collection Time: 05/21/16  1:38 AM  Result Value Ref Range Status   Specimen Description BLOOD RIGHT HAND DRAWN BY RN  Final   Special Requests BOTTLES DRAWN AEROBIC AND ANAEROBIC 8CC EACH  Final   Culture  Setup Time   Final    GRAM NEGATIVE RODS RECOVERED FROM THE AEROBIC BOTTLE Gram Stain Report Called to,Read Back By and Verified With: COVINGTON,L. AT [id number] ON 05/21/2016 BY BAUGHAM,M. Performed at [facility]  WITH: Wille Glaser @0240  05/22/16 MKELLY.MLT Performed at [place] (A)  Final   Report Status 05/23/2016 FINAL  Final   Organism ID, Bacteria ESCHERICHIA COLI  Final      Susceptibility   Escherichia coli - MIC*    AMPICILLIN <=2 SENSITIVE Sensitive     CEFAZOLIN <=4 SENSITIVE Sensitive     CEFEPIME <=1 SENSITIVE Sensitive     CEFTAZIDIME <=1 SENSITIVE Sensitive     CEFTRIAXONE <=1 SENSITIVE Sensitive     CIPROFLOXACIN <=0.25 SENSITIVE Sensitive     GENTAMICIN <=1 SENSITIVE Sensitive     IMIPENEM <=0.25 SENSITIVE Sensitive     TRIMETH/SULFA <=20 SENSITIVE Sensitive     AMPICILLIN/SULBACTAM <=2 SENSITIVE Sensitive     PIP/TAZO <=4 SENSITIVE Sensitive     Extended ESBL NEGATIVE Sensitive     * ESCHERICHIA COLI  Blood Culture ID Panel (Reflexed)     Status: Abnormal   Collection Time: 05/21/16  1:38 AM  Result Value Ref Range Status   Enterococcus species NOT DETECTED NOT DETECTED Final   Listeria monocytogenes NOT DETECTED NOT DETECTED Final   Staphylococcus species NOT DETECTED NOT DETECTED Final   Staphylococcus  aureus NOT DETECTED NOT DETECTED Final   Streptococcus species NOT DETECTED NOT DETECTED Final   Streptococcus agalactiae NOT DETECTED NOT DETECTED Final   Streptococcus pneumoniae NOT DETECTED NOT DETECTED Final   Streptococcus pyogenes NOT DETECTED NOT DETECTED Final   Acinetobacter baumannii NOT DETECTED NOT DETECTED Final   Enterobacteriaceae species DETECTED (A) NOT DETECTED Final    Comment: CRITICAL RESULT CALLED TO, READ BACK BY AND VERIFIED WITH: W BELIZAIRE,RN @0240  05/22/16 MKELLY,MLT    Enterobacter cloacae complex NOT DETECTED NOT DETECTED Final   Escherichia coli DETECTED (A) NOT DETECTED Final    Comment: CRITICAL RESULT CALLED TO, READ BACK BY AND VERIFIED WITH: W BELIZAIRE,RN @0240  05/22/16 MKELLY,MLT    Klebsiella oxytoca NOT DETECTED NOT DETECTED Final   Klebsiella pneumoniae NOT DETECTED NOT DETECTED Final   Proteus species NOT DETECTED NOT DETECTED Final   Serratia marcescens NOT DETECTED NOT DETECTED Final   Carbapenem resistance NOT DETECTED NOT DETECTED Final   Haemophilus influenzae NOT DETECTED NOT DETECTED Final   Neisseria meningitidis NOT DETECTED NOT DETECTED Final   Pseudomonas aeruginosa NOT DETECTED NOT DETECTED Final   Candida albicans NOT DETECTED NOT DETECTED Final   Candida glabrata NOT DETECTED NOT DETECTED Final   Candida krusei NOT DETECTED NOT DETECTED Final   Candida parapsilosis NOT DETECTED NOT DETECTED Final   Candida tropicalis NOT DETECTED NOT DETECTED Final    Comment: Performed at [facility]  Blood Culture (routine x 2)     Status: Abnormal   Collection Time: 05/21/16  1:48 AM  Result Value Ref Range Status   Specimen Description BLOOD LEFT ANTECUBITAL  Final   Special Requests   Final    BOTTLES DRAWN AEROBIC AND ANAEROBIC AEB=12CC ANA=10CC   Culture  Setup Time   Final    GRAM NEGATIVE RODS RECOVERED FROM THE AEROBIC BOTTLE Gram Stain Report Called to,Read Back By and Verified With: COVINGTON,L. AT [id number] ON 05/21/2016 BY  BAUGHAM,M. Performed at [place]    Culture (A)  Final    ESCHERICHIA COLI SUSCEPTIBILITIES PERFORMED ON PREVIOUS CULTURE WITHIN THE LAST 5 DAYS. Performed at [facility]    Report Status 05/23/2016 FINAL  Final  Urine culture     Status: None  Collection Time: 05/21/16  3:51 AM  Result Value Ref Range Status   Specimen Description URINE, CLEAN CATCH  Final   Special Requests NONE  Final   Culture NO GROWTH Performed at [facility]   Final   Report Status 05/22/2016 FINAL  Final         Radiology Studies: No results found.      Scheduled Meds: . atorvastatin  40 mg Oral QHS  . carbidopa-levodopa  1 tablet Oral TID  . cefTRIAXone (ROCEPHIN)  IV  2 g Intravenous Q24H  . chlorhexidine  15 mL Mouth Rinse BID  . Chlorhexidine Gluconate Cloth  6 each Topical Once   And  . Chlorhexidine Gluconate Cloth  6 each Topical Once  . heparin  5,000 Units Subcutaneous Q8H  . mouth rinse  15 mL Mouth Rinse q12n4p  . metoprolol  12.5 mg Oral BID  . pantoprazole  40 mg Oral Daily  . sodium chloride flush  3 mL Intravenous Q12H   Continuous Infusions:   LOS: 3 days    Time spent: 25 minutes     Kathie Dike, MD [facility] If 7PM-7AM, please contact night-coverage [email or web address] Password [email or web address] 05/24/2016, 7:57 AM

## 2016-05-24 NOTE — Progress Notes (Signed)
Pt taken down for Surgery. Consent sent with patient. Pt Alert and stable. Jesus Maldonado c

## 2016-05-24 NOTE — Progress Notes (Signed)
Pt lethargic and hard to arouse. Pt able to answer questions with some slurring of words. BP 88/41 and HR sustaining in the 130's. 95% of RA. MD notified and verbal order for STAT EKG, CBC and 500 cc bolus. If BP does not rise above 90 systolic , will repeat [id number] cc bolus and notify MD. Oswald Hillock, RN

## 2016-05-24 NOTE — Progress Notes (Signed)
Pt's HR sustaining in the 120's. BP now 119/62.  More alert but still lethargic. MD placed order for pt to transfer to ICU as stepdown bed. Family aware. RN made receiving Night RN aware of transfer and when bed is ready to transfer patient. Oswald Hillock, RN

## 2016-05-24 NOTE — Progress Notes (Signed)
Patient Name: Jesus Maldonado Date of Encounter: 05/24/2016  Primary Cardiologist: Lauree Chandler MD  Hospital Problem List     Principal Problem:   Acute cholecystitis Active Problems:   HTN (hypertension)   Hyperlipidemia with target low density lipoprotein (LDL) cholesterol less than 70 mg/dL   CAD (coronary artery disease)   Elevated LFTs   E coli bacteremia    Subjective   Mild abdominal discomfort. No nausea or chest pain. No palpitations.  Inpatient Medications    Scheduled Meds: . atorvastatin  40 mg Oral QHS  . carbidopa-levodopa  1 tablet Oral TID  . cefTRIAXone (ROCEPHIN)  IV  2 g Intravenous Q24H  . chlorhexidine  15 mL Mouth Rinse BID  . Chlorhexidine Gluconate Cloth  6 each Topical Once   And  . Chlorhexidine Gluconate Cloth  6 each Topical Once  . heparin  5,000 Units Subcutaneous Q8H  . mouth rinse  15 mL Mouth Rinse q12n4p  . metoprolol  12.5 mg Oral BID  . pantoprazole  40 mg Oral Daily  . sodium chloride flush  3 mL Intravenous Q12H    PRN Meds: acetaminophen, HYDROmorphone (DILAUDID) injection, ondansetron **OR** ondansetron (ZOFRAN) IV   Vital Signs    Vitals:   05/22/16 1557 05/22/16 2105 05/23/16 0606 05/23/16 2226  BP: (!) 124/57 133/61 (!) 126/55 (!) 159/70  Pulse: 76 76 83 76  Resp: 18 20 20 20   Temp: 98.3 F (36.8 C) 98.4 F (36.9 C) 98.4 F (36.9 C) 98.1 F (36.7 C)  TempSrc: Oral Axillary Oral Oral  SpO2: 99% 100% 97% 97%  Weight:      Height:        Intake/Output Summary (Last 24 hours) at 05/24/16 0838 Last data filed at 05/24/16 0500  Gross per 24 hour  Intake              290 ml  Output              350 ml  Net              -60 ml   Filed Weights   05/21/16 0123 05/21/16 0639  Weight: 220 lb (99.8 kg) 226 lb 13.7 oz (102.9 kg)    Physical Exam    GEN: Elderly male, in no acute distress.  Neck: Supple, no JVD, carotid bruits, or masses. Cardiac: RRR, no murmurs, rubs, or gallops. No clubbing, cyanosis,  edema.  Radials/DP/PT 2+ and equal bilaterally.  Respiratory:  Respirations regular and unlabored, clear to auscultation bilaterally. GI: Soft, tender right upper quadrant and mid abdomen, nondistended, BS + x 4.  Labs    CBC  Recent Labs  05/23/16 0615 05/24/16 0612  WBC 15.2* 15.0*  HGB 10.6* 11.5*  HCT 34.3* 36.6*  MCV 76.2* 76.3*  PLT 240 [id number]   Basic Metabolic Panel  Recent Labs  05/23/16 0615 05/24/16 0612  NA 137 137  K 3.9 4.0  CL 106 104  CO2 25 25  GLUCOSE 87 91  BUN 24* 19  CREATININE 1.21 1.16  CALCIUM 8.0* 8.2*   Liver Function Tests  Recent Labs  05/22/16 0617 05/23/16 0621  AST 199* 112*  ALT 54 76*  ALKPHOS 326* 291*  BILITOT 3.6* 1.7*  PROT 5.7* 5.6*  ALBUMIN 2.2* 2.0*     Telemetry    Telemetry personally reviewed showing sinus rhythm.  Cardiac Studies   Echocardiogram 05/23/2016 - Left ventricle: The cavity size was normal. Wall thickness was   increased in  a pattern of mild LVH. Systolic function was normal.   The estimated ejection fraction was in the range of 60% to 65%.   Wall motion was normal; there were no regional wall motion   abnormalities. Doppler parameters are consistent with abnormal   left ventricular relaxation (grade 1 diastolic dysfunction). - Aortic valve: Mildly calcified annulus. Trileaflet; mildly   calcified leaflets. There was mild regurgitation. - Mitral valve: Calcified annulus. Mildly calcified leaflets .   There was trivial regurgitation. - Right atrium: Central venous pressure (est): 3 mm Hg. - Tricuspid valve: There was trivial regurgitation. - Pulmonary arteries: PA peak pressure: 25 mm Hg (S). - Pericardium, extracardiac: There was no pericardial effusion. Impressions  Mild LVH with LVEF 60-65%. Grade 1 diastolic dysfunction. Mitral   annular calcification with mildly calcified leaflets and trivial   mitral regurgitation. Sclerotic aortic valve without stenosis.   Mild aortic regurgitation.  Trivial tricuspid regurgitation with   normal estimated PASP 25 mmHg.  Patient Profile     82 y.o.male with known history of coronary artery disease status post CABG 2013, previously documented postoperative atrial fibrillation in 2013, hypertension, hyperlipidemia, history of Parkinson disease. Pre-operative consultation in the setting of acute cholecystitis.   Assessment & Plan    1. Coronary artery disease with history of four-vessel CABG 2013: Clinically stable from cardiac standpoint. Repeat echocardiogram reveals normal LV systolic function grade 1 diastolic dysfunction, no significant abnormalities that would prevent planned cholecystectomy. No planned further ischemic workup. Continue beta blocker perioperatively. Aspirin can be discontinued temporarily if necessary. ACE inhibitor was on hold in the setting of hypotension, but blood pressure has normalized. Recommend reinstituting low-dose lisinopril 2.5 mg daily.  2. History of postoperative atrial fibrillation after coronary artery bypass grafting: Remains in normal sinus rhythm and is not on anticoagulation therapy.  3. Acute cholelithiasis with cholecystitis: Planned laparoscopic cholecystectomy this a.m. per Dr. Arnoldo Morale. Based on echocardiogram and cardiac history, the patient is a low to intermediate perioperative cardiac risk.  4. Escherichia coli bacteremia: Remains on IV antibiotics per hospitalist service.  Signed, Leonia Reader, NP 05/24/2016, 8:38 AM   Attending note:  Patient seen and examined. Agree with above assessment by Ms. Lawrence NP. Mr. Hall Busing is scheduled for laparoscopic cholecystectomy today with Dr. Arnoldo Morale. His echocardiogram from yesterday confirms normal LVEF. With no recent angina symptoms on medical therapy, no further ischemic testing is anticipated. Aspirin held. He is currently on Lopressor and Lipitor. Would follow on telemetry at least 24 hours postoperatively. Agree with resuming low-dose ACE  inhibitor. Creatinine 1.2, hemoglobin 11.5.  Perioperative cardiac risk anticipated to be in the low to intermediate range.  Satira Sark, M.D., F.A.C.C.

## 2016-05-24 NOTE — Progress Notes (Signed)
Patient arrived back  to unit. VSS. Patient is in no distress/pain. Order/chart reviewed. RN will continue to monitor.

## 2016-05-24 NOTE — Anesthesia Postprocedure Evaluation (Signed)
Anesthesia Post Note  Patient: Jesus Maldonado  Procedure(s) Performed: Procedure(s) (LRB): LAPAROSCOPIC CHOLECYSTECTOMY (N/A)  Patient location during evaluation: PACU Anesthesia Type: General Level of consciousness: awake and patient cooperative Pain management: pain level controlled Vital Signs Assessment: post-procedure vital signs reviewed and stable Respiratory status: spontaneous breathing, nonlabored ventilation and respiratory function stable Cardiovascular status: blood pressure returned to baseline Postop Assessment: no signs of nausea or vomiting Anesthetic complications: no    Last Vitals:  Vitals:   05/24/16 1315 05/24/16 1324  BP: 114/66 (!) 105/55  Pulse: 98 94  Resp: (!) 24 20  Temp:  36.8 C    Last Pain:  Vitals:   05/24/16 1339  TempSrc:   PainSc: Asleep                 [name] J

## 2016-05-24 NOTE — Anesthesia Preprocedure Evaluation (Signed)
Anesthesia Evaluation  Patient identified by MRN, date of birth, ID band Patient awake    Reviewed: Allergy & Precautions, NPO status , Patient's Chart, lab work & pertinent test results  Airway Mallampati: I  TM Distance: >3 FB Neck ROM: Limited    Dental  (+) Edentulous Upper   Pulmonary former smoker,    breath sounds clear to auscultation       Cardiovascular hypertension, Pt. on medications + CAD, + Past MI and + CABG   Rhythm:Regular Rate:Normal     Neuro/Psych Parkinson's Dx, limited mobility and speech.    GI/Hepatic hiatal hernia, GERD  ,  Endo/Other    Renal/GU      Musculoskeletal   Abdominal   Peds  Hematology   Anesthesia Other Findings   Reproductive/Obstetrics                             Anesthesia Physical Anesthesia Plan  ASA: III  Anesthesia Plan: General   Post-op Pain Management:    Induction: Intravenous, Rapid sequence and Cricoid pressure planned  Airway Management Planned: Oral ETT  Additional Equipment:   Intra-op Plan:   Post-operative Plan: Extubation in OR  Informed Consent: I have reviewed the patients History and Physical, chart, labs and discussed the procedure including the risks, benefits and alternatives for the proposed anesthesia with the patient or authorized representative who has indicated his/her understanding and acceptance.     Plan Discussed with:   Anesthesia Plan Comments:         Anesthesia Quick Evaluation

## 2016-05-24 NOTE — Anesthesia Procedure Notes (Signed)
Procedure Name: Intubation Date/Time: 05/24/2016 11:10 AM Performed by: Charmaine Downs Pre-anesthesia Checklist: Patient identified, Patient being monitored, Timeout performed, Emergency Drugs available and Suction available Patient Re-evaluated:Patient Re-evaluated prior to inductionOxygen Delivery Method: Circle System Utilized Preoxygenation: Pre-oxygenation with 100% oxygen Intubation Type: IV induction, Rapid sequence and Cricoid Pressure applied Ventilation: Mask ventilation without difficulty and Oral airway inserted - appropriate to patient size Laryngoscope Size: Mac and 3 Grade View: Grade I Tube type: Oral Tube size: 8.0 mm Number of attempts: 1 Airway Equipment and Method: stylet Placement Confirmation: ETT inserted through vocal cords under direct vision,  positive ETCO2 and breath sounds checked- equal and bilateral Secured at: 22 cm Tube secured with: Tape Dental Injury: Teeth and Oropharynx as per pre-operative assessment

## 2016-05-24 NOTE — Care Management Important Message (Signed)
Important Message  Patient Details  Name: Jesus Maldonado MRN: [id number] Date of Birth: 1930/07/13   Medicare Important Message Given:  Yes    Sherald Barge, RN 05/24/2016, 10:26 AM

## 2016-05-24 NOTE — Op Note (Signed)
Patient:  Jesus Maldonado  DOB:  December 22, 1930  MRN:  [id number]   Preop Diagnosis:  Acute cholecystitis, cholelithiasis  Postop Diagnosis:  Same, gangrene of gallbladder  Procedure:  Laparoscopic cholecystectomy  Surgeon:  Aviva Signs, M.D.  Assistant: Tama High, M.D.  Anes:  Gen. endotracheal  Indications:  Patient is an 82 year old white male who presented emergency room with worsening right upper quadrant abdominal pain, fever, elevated liver enzyme tests, and leukocytosis. He was found to have acute cholecystitis with cholelithiasis. His liver enzymes tests have been decreasing. Patient now comes the operating room for laparoscopic cholecystectomy with possible cholangiograms. The risks and benefits of the procedure including bleeding, infection, cardiopulmonary difficulties, hepatobiliary injury, and the possibility of an open procedure were fully explained to the patient,  who gave informed consent.  Procedure note:  The patient was placed the supine position. After induction of general endotracheal anesthesia, the abdomen was prepped and draped using usual sterile technique with DuraPrep. Surgical site confirmation was performed.  A supraumbilical incision was made down to the fascia. A Veress needle was introduced into the abdominal cavity and confirmation of placement was done using the saline drop test. The abdomen was then insufflated to 16 mmHg pressure. 11 mm trocar was introduced into the abdominal cavity under direct visualization without difficulty. The patient was placed in reverse Trendelenburg position and additional 11 mm trocar was placed the epigastric region 5 mm trochars were placed the right upper quadrant and right flank regions. Liver was inspected and noted to be somewhat enlarged and fatty in nature. The gallbladder wall was noted to be thickened and the lumen tense. Gangrenous changes were noted throughout the gallbladder wall. The gallbladder was decompressed in  order to facilitate exposure index dynamic fashion in order to provide a critical view of the triangle of Calot. The cystic duct was first identified. Its juncture to the infundibulum was fully identified. The cystic duct was very friable and necrotic. It was elected not to proceed with a cholangiogram. Endoclips were placed proximally and distally on the cystic duct, and the cystic duct was divided. This was likewise done cystic artery. The gallbladder was freed away from the gallbladder fossa with Bovie electrocautery. The gallbladder was delivered through the epigastric trocar site with an Endo Catch bag. It was sent to pathology further examination. A bleeding noted within the gallbladder bed was cauterized.  Arista and Surgicel were placed in the gallbladder fossa for hemostatic control. The right upper quadrant was copiously irrigated with normal saline. All fluid and air were then evacuated from the abdominal cavity prior to removal of the trochars.  All wounds were irrigated with normal saline. All wounds were injected with 0.5% Sensorcaine. The supraumbilical fascia as well as epigastric fascia reapproximated using 0 Vicryl interrupted sutures. All skin incisions were closed using staples. Betadine ointment and dry sterile dressings were applied.  All tape and needle counts were correct at the end of the procedure. The patient was extubated in the operating room and transferred to PACU in stable condition.  Complications:  None  EBL:  Less than 100 mL  Specimen:  Gallbladder

## 2016-05-25 LAB — CBC
HCT: 29.5 % — ABNORMAL LOW (ref 39.0–52.0)
Hemoglobin: 9.1 g/dL — ABNORMAL LOW (ref 13.0–17.0)
MCH: 23.5 pg — ABNORMAL LOW (ref 26.0–34.0)
MCHC: 30.8 g/dL (ref 30.0–36.0)
MCV: 76.2 fL — ABNORMAL LOW (ref 78.0–100.0)
PLATELETS: 349 10*3/uL (ref 150–400)
RBC: 3.87 MIL/uL — AB (ref 4.22–5.81)
RDW: 17.6 % — ABNORMAL HIGH (ref 11.5–15.5)
WBC: 24.9 10*3/uL — AB (ref 4.0–10.5)

## 2016-05-25 LAB — COMPREHENSIVE METABOLIC PANEL
ALBUMIN: 2.1 g/dL — AB (ref 3.5–5.0)
ALK PHOS: 221 U/L — AB (ref 38–126)
ALT: 112 U/L — AB (ref 17–63)
AST: 115 U/L — AB (ref 15–41)
Anion gap: 10 (ref 5–15)
BILIRUBIN TOTAL: 0.7 mg/dL (ref 0.3–1.2)
BUN: 32 mg/dL — AB (ref 6–20)
CALCIUM: 7.7 mg/dL — AB (ref 8.9–10.3)
CO2: 19 mmol/L — AB (ref 22–32)
CREATININE: 1.98 mg/dL — AB (ref 0.61–1.24)
Chloride: 107 mmol/L (ref 101–111)
GFR calc Af Amer: 34 mL/min — ABNORMAL LOW (ref >60)
GFR calc non Af Amer: 30 mL/min — ABNORMAL LOW (ref >60)
GLUCOSE: 129 mg/dL — AB (ref 65–99)
Potassium: 4.8 mmol/L (ref 3.5–5.1)
SODIUM: 136 mmol/L (ref 135–145)
TOTAL PROTEIN: 5.4 g/dL — AB (ref 6.5–8.1)

## 2016-05-25 LAB — PHOSPHORUS: Phosphorus: 4.3 mg/dL (ref 2.5–4.6)

## 2016-05-25 LAB — MRSA PCR SCREENING: MRSA by PCR: NEGATIVE

## 2016-05-25 LAB — MAGNESIUM: MAGNESIUM: 1.9 mg/dL (ref 1.7–2.4)

## 2016-05-25 NOTE — Progress Notes (Signed)
[facility] Day(s): 4.   Post op day(s): 1 Day Post-Op.   Interval History: Patient seen and examined, transferred post-operatively to step-down for monitoring and management of tachycardia to HR 120's - 130's and hypotension to SBP upper 80's. Patient otherwise reports overall controlled moderate epigastric peri-incisional pain and denies N/V, fever/chills, CP, or SOB. His daughter reports his appetite was limited pre-operatively, and his motivation/interest to eat remains limited. He also reports a sore throat.  Review of Systems:  Constitutional: denies fever, chills  HEENT: sore throat as per HPI  Respiratory: denies any shortness of breath  Cardiovascular: denies chest pain or palpitations  Gastrointestinal: abdominal pain, appetite, and N/V as per HPI  Musculoskeletal: denies pain, decreased motor or sensation  Neurological: denies HA or vision/hearing changes   Vital signs in last 24 hours: [min-max] current  Temp:  [95.5 F (35.3 C)-98.8 F (37.1 C)] 97.2 F (36.2 C) (11/18 0826) Pulse Rate:  [90-130] 101 (11/18 0800) Resp:  [16-24] 22 (11/18 0800) BP: (84-162)/(41-85) 131/64 (11/18 0800) SpO2:  [92 %-100 %] 95 % (11/18 0800) Weight:  [107.1 kg (236 lb 1.8 oz)-108 kg (238 lb 1.6 oz)] 108 kg (238 lb 1.6 oz) (11/18 0400)     Height: 5\' 10"  (177.8 cm) Weight: 108 kg (238 lb 1.6 oz) BMI (Calculated): 33.9   Intake/Output this shift:  No intake/output data recorded.   Intake/Output last 2 shifts:  @IOLAST2SHIFTS @   Physical Exam:  Constitutional: alert, cooperative and no distress  HENT: normocephalic without obvious abnormality  Eyes: PERRL, EOM's grossly intact and symmetric  Neuro: CN II - XII grossly intact and symmetric without deficit  Respiratory: breathing non-labored at rest  Cardiovascular: regular rate and sinus rhythm  Gastrointestinal: soft, obese, moderate epigastric peri-incisional tenderness to palpation, non-distended   Musculoskeletal: UE and LE FROM, B/L 1+ LE edema, no wounds, motor and sensation grossly intact  Labs:  CBC Latest Ref Rng & Units 05/25/2016 05/24/2016 05/24/2016  WBC 4.0 - 10.5 K/uL 24.9(H) 31.9(H) 15.0(H)  Hemoglobin 13.0 - 17.0 g/dL 9.1(L) 9.7(L) 11.5(L)  Hematocrit 39.0 - 52.0 % 29.5(L) 31.5(L) 36.6(L)  Platelets 150 - 400 K/uL 349 304 264   CMP Latest Ref Rng & Units 05/25/2016 05/24/2016 05/23/2016  Glucose 65 - 99 mg/dL 129(H) 91 87  BUN 6 - 20 mg/dL 32(H) 19 24(H)  Creatinine 0.61 - 1.24 mg/dL 1.98(H) 1.16 1.21  Sodium 135 - 145 mmol/L 136 137 137  Potassium 3.5 - 5.1 mmol/L 4.8 4.0 3.9  Chloride 101 - 111 mmol/L 107 104 106  CO2 22 - 32 mmol/L 19(L) 25 25  Calcium 8.9 - 10.3 mg/dL 7.7(L) 8.2(L) 8.0(L)  Total Protein 6.5 - 8.1 g/dL 5.4(L) - 5.6(L)  Total Bilirubin 0.3 - 1.2 mg/dL 0.7 - 1.7(H)  Alkaline Phos 38 - 126 U/L 221(H) - 291(H)  AST 15 - 41 U/L 115(H) - 112(H)  ALT 17 - 63 U/L 112(H) - 76(H)    Imaging studies: No new pertinent imaging studies   Assessment/Plan: (ICD-10's: K81.0) 82 y.o. male doing overall okay considering his baseline with improving leukocytosis 1 Day Post-Op s/p laparoscopic cholecystectomy for suppurative/emphysematous/gangrenous cholecystitis, complicated by gram negative bacteremia and pertinent comorbidities including CAD s/p CABG and PCI for MI (2013), atrial fibrillation not on anticoagulation due to fall risk, AKI superimposed on CKD, HTN, HLD, Parkinson's disease, and osteoarthritis.   - pain control prn, minimize narcotics  - advance to heart-healthy diet as tolerated   - antibiotics, medical management, and  ultimately discharge planning as per medical team  - activity as tolerated with appropriate assist, DVT prophylaxis  - please call if any questions or concerns  All of the above findings and recommendations were discussed with the patient, patient's daughter (bedside), and the medical team, and all of patient's and family's  questions were answered to their expressed satisfaction.  -- Marilynne Drivers Rosana Hoes, MD, [place]: [facility] Office: [phone]

## 2016-05-25 NOTE — Anesthesia Postprocedure Evaluation (Signed)
Anesthesia Post Note  Patient: Jesus Maldonado  Procedure(s) Performed: Procedure(s) (LRB): LAPAROSCOPIC CHOLECYSTECTOMY (N/A)  Patient location during evaluation: ICU Anesthesia Type: General Level of consciousness: awake and patient cooperative Pain management: pain level controlled Vital Signs Assessment: post-procedure vital signs reviewed and stable Respiratory status: spontaneous breathing, patient connected to nasal cannula oxygen, nonlabored ventilation and respiratory function stable Cardiovascular status: blood pressure returned to baseline Postop Assessment: no signs of nausea or vomiting Anesthetic complications: no    Last Vitals:  Vitals:   05/25/16 0800 05/25/16 0826  BP: 131/64   Pulse: (!) 101   Resp: (!) 22   Temp:  36.2 C    Last Pain:  Vitals:   05/25/16 0826  TempSrc: Oral  PainSc:                  Yaremi Stahlman J

## 2016-05-25 NOTE — Addendum Note (Signed)
Addendum  created 05/25/16 1036 by Charmaine Downs, CRNA   Sign clinical note

## 2016-05-25 NOTE — Progress Notes (Signed)
PROGRESS NOTE    Jesus Maldonado  [id number] DOB: July 22, 1930 DOA: 05/21/2016 PCP: Kendrick Ranch, MD    Brief Narrative:  49 yom with a hx of CAD s/p CABG x4. A-fib not on any anticoagulation, HTN, HLD, and parkinson disease, presented with complaints of abdominal pain. While in the ED, serology shows creatine 1.65, total bili 4.2, WBC 15.0, and hemoglobin 11.8. Abdominal CT is consistent with cholecystitis. General surgery and GI was consulted and he was admitted for further evaluation of acute cholecystitis.    Assessment & Plan:   Principal Problem:   Acute cholecystitis Active Problems:   HTN (hypertension)   Hyperlipidemia with target low density lipoprotein (LDL) cholesterol less than 70 mg/dL   CAD (coronary artery disease)   Elevated LFTs   E coli bacteremia  1. Acute cholecystitis. Abdominal CT was consistent with cholecystitis. Since admission, his symptoms have started to improve. LFTs remain elevated. Continue broad spectrum antibiotics. Continue Protonix, and analgesic.GI and general surgery following. Patient underwent laparoscopic cholecystectomy on 11/17. Further postoperative care per general surgery. 2. Escherichia coli sepsis. Blood cultures positive for Escherichia coli, likely from biliary source. Urine cultures show no growth. WBC has trended down to 24.9 over night. Continue Rocephin.  3. HTN. Continue to hold ramipril due to soft pressuresand renal failure. Continue metoprolol with holding parameters.  4. AKI. Renal function had initially improved, but has declined overnight. Possibly secondary to episodes of hypotension experienced yesterday. Patient does have evidence of LE edema and blood pressures have stabilized. Will hold off on further IVF and monitor renal function.  5. Microcytic anemia. H&H remain low. There are no signs of bleeding at this time. Recheck CBC in the a.m. 6. A-fib. Patient is not anticoagulated on any medication due to fall  risk. Rate is controlled. Continue beta-blocker.  7. Sinus tachycardia. Patient noted to have significant sinus tachycardia with heart rate in the 120s to 130s on 11/17. Blood pressure was noted to be in the high 80s. He was bolused IV fluids with improvement in hemodynamics. Hemoglobin was rechecked and noted to have a mild decline since surgery, but no indication for transfusion at this point. Continue to monitor closely 8. Lethargy. Likely related to hypotension as well as residual effect of anesthesia. Mental status back to baseline. Continue to monitor. 9. CAD hx. S/p 4 CABGs. Aspirin currently on hold.  10. HLD. Continue statin. 11. Anasarca. Patient has been receiving IV fluids and is noted to be hypoalbuminemic likely leading to third spacing of IV fluids. Will need to be cautious with further IV fluids.  DVT prophylaxis: SCDs  Code Status: Full  Family Communication: no family present Disposition Plan: transfer to telemetry   Consultants:   General surgery   GI  Cardiology  Procedures:   ECHO Study Conclusions  - Left ventricle: The cavity size was normal. Wall thickness was   increased in a pattern of mild LVH. Systolic function was normal.   The estimated ejection fraction was in the range of 60% to 65%.   Wall motion was normal; there were no regional wall motion   abnormalities. Doppler parameters are consistent with abnormal   left ventricular relaxation (grade 1 diastolic dysfunction). - Aortic valve: Mildly calcified annulus. Trileaflet; mildly   calcified leaflets. There was mild regurgitation. - Mitral valve: Calcified annulus. Mildly calcified leaflets .   There was trivial regurgitation. - Right atrium: Central venous pressure (est): 3 mm Hg. - Tricuspid valve: There was trivial regurgitation. - Pulmonary arteries:  PA peak pressure: 25 mm Hg (S). - Pericardium, extracardiac: There was no pericardial effusion.   Antimicrobials:   Vancomycin 11/14  >>11/15  Zosyn 11/14>>11/16  Rocephin 11/16>>   Subjective: Patient denies any shortness of breath. Abdominal pain is controled. No vomiting.  Objective: Vitals:   05/25/16 0300 05/25/16 0400 05/25/16 0500 05/25/16 0600  BP: 121/72 115/63 105/66 137/67  Pulse: (!) 102 94 (!) 109 96  Resp: 18 18 (!) 21 (!) 22  Temp:  98.8 F (37.1 C)    TempSrc:  Oral    SpO2: 96% 96% 96% 97%  Weight:  108 kg (238 lb 1.6 oz)    Height:        Intake/Output Summary (Last 24 hours) at 05/25/16 0704 Last data filed at 05/25/16 [id number]  Gross per 24 hour  Intake          2933.75 ml  Output               25 ml  Net          2908.75 ml   Filed Weights   05/21/16 0639 05/24/16 2000 05/25/16 0400  Weight: 102.9 kg (226 lb 13.7 oz) 107.1 kg (236 lb 1.8 oz) 108 kg (238 lb 1.6 oz)    Examination:  General exam: Appears calm and comfortable  Respiratory system: Clear to auscultation. Respiratory effort normal. Cardiovascular system: S1 & S2 heard, RRR. No JVD, murmurs, rubs, gallops or clicks. 1+ pedal edema. Gastrointestinal system: Abdomen is nondistended, soft and nontender. No organomegaly or masses felt. Normal bowel sounds heard. Central nervous system: Alert and oriented. No focal neurological deficits. Extremities: Symmetric 5 x 5 power. Skin: No rashes, lesions or ulcers Psychiatry: Judgement and insight appear normal. Mood & affect appropriate.     Data Reviewed: I have personally reviewed following labs and imaging studies  CBC:  Recent Labs Lab 05/21/16 0137 05/22/16 0617 05/23/16 0615 05/24/16 0612 05/24/16 1734 05/25/16 0500  WBC 15.0* 18.7* 15.2* 15.0* 31.9* 24.9*  NEUTROABS 13.5*  --   --   --  29.3*  --   HGB 11.8* 10.6* 10.6* 11.5* 9.7* 9.1*  HCT 36.3* 34.1* 34.3* 36.6* 31.5* 29.5*  MCV 75.2* 76.3* 76.2* 76.3* 76.6* 76.2*  PLT 227 226 240 264 304 0000000   Basic Metabolic Panel:  Recent Labs Lab 05/21/16 0137 05/22/16 0617 05/23/16 0615 05/24/16 0612  NA 132*  136 137 137  K 3.3* 3.9 3.9 4.0  CL 97* 106 106 104  CO2 24 22 25 25   GLUCOSE 135* 88 87 91  BUN 39* 26* 24* 19  CREATININE 1.65* 1.31* 1.21 1.16  CALCIUM 8.3* 7.9* 8.0* 8.2*   GFR: Estimated Creatinine Clearance: 59.4 mL/min (by C-G formula based on SCr of 1.16 mg/dL). Liver Function Tests:  Recent Labs Lab 05/21/16 0137 05/21/16 0203 05/22/16 0617 05/23/16 0621  AST 418*  --  199* 112*  ALT 220*  --  54 76*  ALKPHOS 398*  --  326* 291*  BILITOT 4.2* 4.4* 3.6* 1.7*  PROT 6.8  --  5.7* 5.6*  ALBUMIN 2.8*  --  2.2* 2.0*    Recent Labs Lab 05/21/16 0137  LIPASE 31   Sepsis Labs:  Recent Labs Lab 05/21/16 0153  LATICACIDVEN 1.80    Recent Results (from the past 240 hour(s))  Blood Culture (routine x 2)     Status: Abnormal   Collection Time: 05/21/16  1:38 AM  Result Value Ref Range Status   Specimen Description BLOOD  RIGHT HAND DRAWN BY RN  Final   Special Requests BOTTLES DRAWN AEROBIC AND ANAEROBIC 8CC EACH  Final   Culture  Setup Time   Final    GRAM NEGATIVE RODS RECOVERED FROM THE AEROBIC BOTTLE Gram Stain Report Called to,Read Back By and Verified With: COVINGTON,L. AT [id number] ON 05/21/2016 BY BAUGHAM,M. Performed at [facility] BY AND VERIFIED WITH: W BELIZAIRE,RN @0240  05/22/16 [email or web address] Performed at [facility] (A)  Final   Report Status 05/23/2016 FINAL  Final   Organism ID, Bacteria ESCHERICHIA COLI  Final      Susceptibility   Escherichia coli - MIC*    AMPICILLIN <=2 SENSITIVE Sensitive     CEFAZOLIN <=4 SENSITIVE Sensitive     CEFEPIME <=1 SENSITIVE Sensitive     CEFTAZIDIME <=1 SENSITIVE Sensitive     CEFTRIAXONE <=1 SENSITIVE Sensitive     CIPROFLOXACIN <=0.25 SENSITIVE Sensitive     GENTAMICIN <=1 SENSITIVE Sensitive     IMIPENEM <=0.25 SENSITIVE Sensitive     TRIMETH/SULFA <=20 SENSITIVE Sensitive     AMPICILLIN/SULBACTAM <=2 SENSITIVE Sensitive      PIP/TAZO <=4 SENSITIVE Sensitive     Extended ESBL NEGATIVE Sensitive     * ESCHERICHIA COLI  Blood Culture ID Panel (Reflexed)     Status: Abnormal   Collection Time: 05/21/16  1:38 AM  Result Value Ref Range Status   Enterococcus species NOT DETECTED NOT DETECTED Final   Listeria monocytogenes NOT DETECTED NOT DETECTED Final   Staphylococcus species NOT DETECTED NOT DETECTED Final   Staphylococcus aureus NOT DETECTED NOT DETECTED Final   Streptococcus species NOT DETECTED NOT DETECTED Final   Streptococcus agalactiae NOT DETECTED NOT DETECTED Final   Streptococcus pneumoniae NOT DETECTED NOT DETECTED Final   Streptococcus pyogenes NOT DETECTED NOT DETECTED Final   Acinetobacter baumannii NOT DETECTED NOT DETECTED Final   Enterobacteriaceae species DETECTED (A) NOT DETECTED Final    Comment: CRITICAL RESULT CALLED TO, READ BACK BY AND VERIFIED WITH: W BELIZAIRE,RN @0240  05/22/16 MKELLY,MLT    Enterobacter cloacae complex NOT DETECTED NOT DETECTED Final   Escherichia coli DETECTED (A) NOT DETECTED Final    Comment: CRITICAL RESULT CALLED TO, READ BACK BY AND VERIFIED WITH: W BELIZAIRE,RN @0240  05/22/16 MKELLY,MLT    Klebsiella oxytoca NOT DETECTED NOT DETECTED Final   Klebsiella pneumoniae NOT DETECTED NOT DETECTED Final   Proteus species NOT DETECTED NOT DETECTED Final   Serratia marcescens NOT DETECTED NOT DETECTED Final   Carbapenem resistance NOT DETECTED NOT DETECTED Final   Haemophilus influenzae NOT DETECTED NOT DETECTED Final   Neisseria meningitidis NOT DETECTED NOT DETECTED Final   Pseudomonas aeruginosa NOT DETECTED NOT DETECTED Final   Candida albicans NOT DETECTED NOT DETECTED Final   Candida glabrata NOT DETECTED NOT DETECTED Final   Candida krusei NOT DETECTED NOT DETECTED Final   Candida parapsilosis NOT DETECTED NOT DETECTED Final   Candida tropicalis NOT DETECTED NOT DETECTED Final    Comment: Performed at [facility]  Blood Culture (routine x 2)      Status: Abnormal   Collection Time: 05/21/16  1:48 AM  Result Value Ref Range Status   Specimen Description BLOOD LEFT ANTECUBITAL  Final   Special Requests   Final    BOTTLES DRAWN AEROBIC AND ANAEROBIC AEB=12CC ANA=10CC   Culture  Setup Time   Final    GRAM NEGATIVE RODS RECOVERED FROM THE AEROBIC BOTTLE Gram Stain Report  Called to,Read Back By and Verified With: COVINGTON,L. AT [id number] ON 05/21/2016 BY BAUGHAM,M. Performed at [facility]    Culture (A)  Final    ESCHERICHIA COLI SUSCEPTIBILITIES PERFORMED ON PREVIOUS CULTURE WITHIN THE LAST 5 DAYS. Performed at [facility]    Report Status 05/23/2016 FINAL  Final  Urine culture     Status: None   Collection Time: 05/21/16  3:51 AM  Result Value Ref Range Status   Specimen Description URINE, CLEAN CATCH  Final   Special Requests NONE  Final   Culture NO GROWTH Performed at [facility]   Final   Report Status 05/22/2016 FINAL  Final  MRSA PCR Screening     Status: None   Collection Time: 05/24/16  8:32 PM  Result Value Ref Range Status   MRSA by PCR NEGATIVE NEGATIVE Final    Comment:        The GeneXpert MRSA Assay (FDA approved for NASAL specimens only), is one component of a comprehensive MRSA colonization surveillance program. It is not intended to diagnose MRSA infection nor to guide or monitor treatment for MRSA infections.     Radiology Studies: No results found.   Scheduled Meds: . sodium chloride   Intravenous Once  . atorvastatin  40 mg Oral QHS  . carbidopa-levodopa  1 tablet Oral TID  . cefTRIAXone (ROCEPHIN)  IV  2 g Intravenous Q24H  . chlorhexidine  15 mL Mouth Rinse BID  . mouth rinse  15 mL Mouth Rinse q12n4p  . metoprolol  12.5 mg Oral BID  . pantoprazole  40 mg Oral Daily  . sodium chloride flush  3 mL Intravenous Q12H   Continuous Infusions: . sodium chloride 100 mL/hr at 05/25/16 0600     LOS: 4 days    Time spent: 25  minutes     Kathie Dike, MD [facility] If 7PM-7AM, please contact night-coverage [email or web address] Password [email or web address] 05/25/2016, 7:04 AM

## 2016-05-26 DIAGNOSIS — G20 Parkinson's disease: Secondary | ICD-10-CM

## 2016-05-26 LAB — COMPREHENSIVE METABOLIC PANEL
ALK PHOS: 201 U/L — AB (ref 38–126)
ALT: 40 U/L (ref 17–63)
ANION GAP: 8 (ref 5–15)
AST: 61 U/L — ABNORMAL HIGH (ref 15–41)
Albumin: 2.1 g/dL — ABNORMAL LOW (ref 3.5–5.0)
BILIRUBIN TOTAL: 0.7 mg/dL (ref 0.3–1.2)
BUN: 40 mg/dL — AB (ref 6–20)
CALCIUM: 8 mg/dL — AB (ref 8.9–10.3)
CO2: 24 mmol/L (ref 22–32)
Chloride: 106 mmol/L (ref 101–111)
Creatinine, Ser: 1.62 mg/dL — ABNORMAL HIGH (ref 0.61–1.24)
GFR calc Af Amer: 44 mL/min — ABNORMAL LOW (ref >60)
GFR, EST NON AFRICAN AMERICAN: 38 mL/min — AB (ref >60)
Glucose, Bld: 120 mg/dL — ABNORMAL HIGH (ref 65–99)
POTASSIUM: 4.2 mmol/L (ref 3.5–5.1)
Sodium: 138 mmol/L (ref 135–145)
TOTAL PROTEIN: 5.7 g/dL — AB (ref 6.5–8.1)

## 2016-05-26 LAB — CBC
HEMATOCRIT: 26.2 % — AB (ref 39.0–52.0)
HEMOGLOBIN: 8.2 g/dL — AB (ref 13.0–17.0)
MCH: 23.6 pg — ABNORMAL LOW (ref 26.0–34.0)
MCHC: 31.3 g/dL (ref 30.0–36.0)
MCV: 75.5 fL — AB (ref 78.0–100.0)
Platelets: 409 10*3/uL — ABNORMAL HIGH (ref 150–400)
RBC: 3.47 MIL/uL — ABNORMAL LOW (ref 4.22–5.81)
RDW: 17.4 % — AB (ref 11.5–15.5)
WBC: 32.3 10*3/uL — ABNORMAL HIGH (ref 4.0–10.5)

## 2016-05-26 MED ORDER — BISACODYL 10 MG RE SUPP
10.0000 mg | Freq: Two times a day (BID) | RECTAL | Status: DC
  Administered 2016-05-26 – 2016-05-27 (×3): 10 mg via RECTAL
  Filled 2016-05-26 (×3): qty 1

## 2016-05-26 MED ORDER — MAGIC MOUTHWASH
ORAL | Status: AC
  Filled 2016-05-26: qty 10

## 2016-05-26 MED ORDER — MILK AND MOLASSES ENEMA
1.0000 | Freq: Once | RECTAL | Status: AC
  Administered 2016-05-26: 250 mL via RECTAL

## 2016-05-26 MED ORDER — MAGIC MOUTHWASH W/LIDOCAINE
15.0000 mL | Freq: Four times a day (QID) | ORAL | Status: DC
  Administered 2016-05-26 (×2): 15 mL via ORAL
  Filled 2016-05-26 (×4): qty 15

## 2016-05-26 MED ORDER — DEXTROSE 5 % IV SOLN
INTRAVENOUS | Status: AC
  Filled 2016-05-26: qty 2

## 2016-05-26 MED ORDER — MAGNESIUM HYDROXIDE 400 MG/5ML PO SUSP
30.0000 mL | Freq: Every day | ORAL | Status: DC
  Administered 2016-05-26: 30 mL via ORAL
  Filled 2016-05-26 (×2): qty 30

## 2016-05-26 NOTE — Progress Notes (Signed)
[facility] Hospital Day(s): 5.   Post op day(s): 2 Days Post-Op.   Interval History: Patient seen and examined, no acute events or new complaints overnight. Patient reports his abdominal pain is much improved with passing flatus and mild nausea earlier this morning without emesis, and he denies fever/chills, CP, or SOB.  Review of Systems:  Constitutional: denies fever, chills  HEENT: denies cough or congestion  Respiratory: denies any shortness of breath  Cardiovascular: denies chest pain or palpitations  Gastrointestinal: abdominal pain, N/V, and bowel function as per HPI Musculoskeletal: denies pain, decreased motor or sensation  Neurological: denies HA or vision/hearing changes   Vital signs in last 24 hours: [min-max] current  Temp:  [97 F (36.1 C)-98.3 F (36.8 C)] 97 F (36.1 C) (11/19 1156) Pulse Rate:  [82-101] 91 (11/19 1012) Resp:  [14-24] 22 (11/19 1000) BP: (118-164)/(60-106) 146/70 (11/19 1012) SpO2:  [95 %-99 %] 96 % (11/19 1000) Weight:  [108.1 kg (238 lb 5.1 oz)] 108.1 kg (238 lb 5.1 oz) (11/19 0500)     Height: 5\' 10"  (177.8 cm) Weight: 108.1 kg (238 lb 5.1 oz) BMI (Calculated): 33.9   Intake/Output this shift:  Total I/O In: 3 [I.V.:3] Out: -    Intake/Output last 2 shifts:  @IOLAST2SHIFTS @   Physical Exam:  Constitutional: alert, cooperative and no distress  HENT: normocephalic without obvious abnormality  Eyes: PERRL, EOM's grossly intact and symmetric  Neuro: CN II - XII grossly intact and symmetric without deficit  Respiratory: breathing non-labored at rest  Cardiovascular: regular rate and sinus rhythm  Gastrointestinal: soft, mild epigastric abdominal tenderness to palpation, dressings c/d/i  Musculoskeletal: UE and LE FROM, motor and sensation grossly intact, NT   Labs:  CBC Latest Ref Rng & Units 05/26/2016 05/25/2016 05/24/2016  WBC 4.0 - 10.5 K/uL 32.3(H) 24.9(H) 31.9(H)  Hemoglobin 13.0 - 17.0 g/dL 8.2(L) 9.1(L) 9.7(L)   Hematocrit 39.0 - 52.0 % 26.2(L) 29.5(L) 31.5(L)  Platelets 150 - 400 K/uL 409(H) 349 304   CMP Latest Ref Rng & Units 05/26/2016 05/25/2016 05/24/2016  Glucose 65 - 99 mg/dL 120(H) 129(H) 91  BUN 6 - 20 mg/dL 40(H) 32(H) 19  Creatinine 0.61 - 1.24 mg/dL 1.62(H) 1.98(H) 1.16  Sodium 135 - 145 mmol/L 138 136 137  Potassium 3.5 - 5.1 mmol/L 4.2 4.8 4.0  Chloride 101 - 111 mmol/L 106 107 104  CO2 22 - 32 mmol/L 24 19(L) 25  Calcium 8.9 - 10.3 mg/dL 8.0(L) 7.7(L) 8.2(L)  Total Protein 6.5 - 8.1 g/dL 5.7(L) 5.4(L) -  Total Bilirubin 0.3 - 1.2 mg/dL 0.7 0.7 -  Alkaline Phos 38 - 126 U/L 201(H) 221(H) -  AST 15 - 41 U/L 61(H) 115(H) -  ALT 17 - 63 U/L 40 112(H) -    Imaging studies: No new pertinent imaging studies   Assessment/Plan: (ICD-10's: K81.0) 82 y.o. male doing overall okay considering his baseline with essentially stable leukocytosis 2 Days Post-Op s/p laparoscopic cholecystectomy for suppurative/emphysematous/gangrenous cholecystitis, complicated by pre-op gram negative bacteremia and pertinent comorbidities including CAD s/p CABG and PCI for MI (2013), atrial fibrillation not on anticoagulation due to fall risk, AKI superimposed on CKD, HTN, HLD, Parkinson's disease, and osteoarthritis.              - pain control prn, minimize narcotics             - advance to heart-healthy diet as tolerated              -  antibiotics, medical management, and ultimately discharge planning as per medical team             - activity as tolerated with appropriate assist, DVT prophylaxis  - outpatient follow-up with Dr. Arnoldo Morale 2 weeks post-op             - please call if any questions or concerns  All of the above findings and recommendations were discussed with the patient, patient's daughter (bedside), and the medical team, and all of patient's and family's questions were answered to their expressed satisfaction.  -- Marilynne Drivers Rosana Hoes, MD, [place]: [facility] Office: [phone]

## 2016-05-26 NOTE — Progress Notes (Signed)
PROGRESS NOTE    Jesus DRISKEL  [id number] DOB: January 19, 1931 DOA: 05/21/2016 PCP: Kendrick Ranch, MD    Brief Narrative:  89 yom with a hx of CAD s/p CABG x4. A-fib not on any anticoagulation, HTN, HLD, and parkinson disease, presented with complaints of abdominal pain. While in the ED, serology shows creatine 1.65, total bili 4.2, WBC 15.0, and hemoglobin 11.8. Abdominal CT is consistent with cholecystitis. General surgery and GI were consulted and he was admitted for further evaluation of acute cholecystitis. Blood cultures returned positive for E coli and he has been continued on rocephin. He underwent lap cholecystectomy in which he was found to have a gangrenous gallbladder.    Assessment & Plan:   Principal Problem:   Acute cholecystitis Active Problems:   HTN (hypertension)   Hyperlipidemia with target low density lipoprotein (LDL) cholesterol less than 70 mg/dL   CAD (coronary artery disease)   Elevated LFTs   E coli bacteremia  1. Acute cholecystitis. Abdominal CT was consistent with cholecystitis. Since admission, his symptoms have started to improve. LFTs remain elevated. Continue broad spectrum antibiotics. Continue Protonix, and analgesic.GI and general surgery following. Patient underwent laparoscopic cholecystectomy on 11/17. Will continue with pain management with minimal narcotics per general surgery. 2. Escherichia coli sepsis. Blood cultures positive for Escherichia coli, likely from biliary source. Urine cultures show no growth. WBC has trended up to 32.3 over night. He is afebrile and clinically does not appear to be doing any worse. No diarrhea. Will continue to monitor closely, and if condition deteriorates, can consider broadening abx coverage to zosyn. Continue Rocephin for now.  3. HTN. Pressures are elevated. Will continue to hold ramipril due to renal failure. Continue metoprolol with holding parameters.  4. AKI. Possibly secondary to episodes of  hypotension he experienced 2 days ago. Patient does have evidence of LE edema and blood pressures have stabilized. Will hold off on further IVF and monitor renal function. Creatinine is mildly better today 5. Microcytic anemia.Post operatively, hemoglobin has slowly declined. If lower tomorrow, can consider transfusion. 6. A-fib. Patient is not anticoagulated on any medication due to fall risk. Rate remains controlled. Continue beta-blocker.  7. Sinus tachycardia. Patient noted to have significant sinus tachycardia with heart rate in the 120s to 130s on 11/17. Blood pressure was noted to be in the high 80s. He was bolused IV fluids with improvement in hemodynamics. Hemoglobin was rechecked and noted to have a mild decline since surgery, but no indication for transfusion at this point. Continue to monitor closely 8. Lethargy. Likely related to hypotension as well as residual effect of anesthesia. Mental status back to baseline. Continue to monitor. 9. CAD hx. S/p 4 CABGs. Aspirin currently on hold.  10. HLD. Continue statin. 11. Anasarca. Patient has been receiving IV fluids and is noted to be hypoalbuminemic likely leading to third spacing of IV fluids. Will need to be cautious with further IV fluids. 12. Parkinson's Disease. Continue on sinimet  DVT prophylaxis: SCDs  Code Status: Full  Family Communication: discussed with daughter bedside Disposition Plan: Discharge home once improved.    Consultants:   General surgery  Cardiology  GI   Procedures:   Echo  - Left ventricle: The cavity size was normal. Wall thickness was increased in a pattern of mild LVH. Systolic function was normal. The estimated ejection fraction was in the range of 60% to 65%. Wall motion was normal; there were no regional wall motion abnormalities. Doppler parameters are consistent with abnormal left  ventricular relaxation (grade 1 diastolic dysfunction). - Aortic valve: Mildly calcified annulus.  Trileaflet; mildly calcified leaflets. There was mild regurgitation. - Mitral valve: Calcified annulus. Mildly calcified leaflets . There was trivial regurgitation. - Right atrium: Central venous pressure (est): 3 mm Hg. - Tricuspid valve: There was trivial regurgitation. - Pulmonary arteries: PA peak pressure: 25 mm Hg (S). - Pericardium, extracardiac: There was no pericardial effusion.  Antimicrobials:   Vancomycin 11/14 >>11/15  Zosyn 11/14>>11/16  Rocephin 11/16>>   Subjective: Had some nausea this morning. No vomiting. Has not had a bowel movement in several days  Objective: Vitals:   05/26/16 0300 05/26/16 0400 05/26/16 0500 05/26/16 0600  BP: 132/60 134/65 (!) 146/68 (!) 157/69  Pulse: 82 85 91 94  Resp: 14 19 19 20   Temp:  97.3 F (36.3 C)    TempSrc:  Oral    SpO2: 99% 98% 95% 95%  Weight:   108.1 kg (238 lb 5.1 oz)   Height:        Intake/Output Summary (Last 24 hours) at 05/26/16 0738 Last data filed at 05/26/16 0500  Gross per 24 hour  Intake             1070 ml  Output              800 ml  Net              270 ml   Filed Weights   05/24/16 2000 05/25/16 0400 05/26/16 0500  Weight: 107.1 kg (236 lb 1.8 oz) 108 kg (238 lb 1.6 oz) 108.1 kg (238 lb 5.1 oz)    Examination:  General exam: Appears calm and comfortable  Respiratory system: Clear to auscultation. Respiratory effort normal. Cardiovascular system: S1 & S2 heard, RRR. No JVD, murmurs, rubs, gallops or clicks. trace pedal edema. Gastrointestinal system: Abdomen is nondistended, but feels full, soft and nontender. No organomegaly or masses felt. Normal bowel sounds heard. Central nervous system: Alert and oriented. No focal neurological deficits. Extremities: Symmetric 5 x 5 power. Skin: No rashes, lesions or ulcers Psychiatry: Judgement and insight appear normal. Mood & affect appropriate.     Data Reviewed: I have personally reviewed following labs and imaging  studies  CBC:  Recent Labs Lab 05/21/16 0137  05/23/16 0615 05/24/16 0612 05/24/16 1734 05/25/16 0500 05/26/16 0443  WBC 15.0*  < > 15.2* 15.0* 31.9* 24.9* 32.3*  NEUTROABS 13.5*  --   --   --  29.3*  --   --   HGB 11.8*  < > 10.6* 11.5* 9.7* 9.1* 8.2*  HCT 36.3*  < > 34.3* 36.6* 31.5* 29.5* 26.2*  MCV 75.2*  < > 76.2* 76.3* 76.6* 76.2* 75.5*  PLT 227  < > 240 264 304 349 409*  < > = values in this interval not displayed. Basic Metabolic Panel:  Recent Labs Lab 05/22/16 0617 05/23/16 0615 05/24/16 0612 05/25/16 0500 05/26/16 0443  NA 136 137 137 136 138  K 3.9 3.9 4.0 4.8 4.2  CL 106 106 104 107 106  CO2 22 25 25  19* 24  GLUCOSE 88 87 91 129* 120*  BUN 26* 24* 19 32* 40*  CREATININE 1.31* 1.21 1.16 1.98* 1.62*  CALCIUM 7.9* 8.0* 8.2* 7.7* 8.0*  MG  --   --   --  1.9  --   PHOS  --   --   --  4.3  --    GFR: Estimated Creatinine Clearance: 42.5 mL/min (by C-G formula based on  SCr of 1.62 mg/dL (H)). Liver Function Tests:  Recent Labs Lab 05/21/16 0137 05/21/16 0203 05/22/16 0617 05/23/16 0621 05/25/16 0500 05/26/16 0443  AST 418*  --  199* 112* 115* 61*  ALT 220*  --  54 76* 112* 40  ALKPHOS 398*  --  326* 291* 221* 201*  BILITOT 4.2* 4.4* 3.6* 1.7* 0.7 0.7  PROT 6.8  --  5.7* 5.6* 5.4* 5.7*  ALBUMIN 2.8*  --  2.2* 2.0* 2.1* 2.1*    Recent Labs Lab 05/21/16 0137  LIPASE 31   No results for input(s): AMMONIA in the last 168 hours. Coagulation Profile: No results for input(s): INR, PROTIME in the last 168 hours. Cardiac Enzymes: No results for input(s): CKTOTAL, CKMB, CKMBINDEX, TROPONINI in the last 168 hours. BNP (last 3 results) No results for input(s): PROBNP in the last 8760 hours. HbA1C: No results for input(s): HGBA1C in the last 72 hours. CBG: No results for input(s): GLUCAP in the last 168 hours. Lipid Profile: No results for input(s): CHOL, HDL, LDLCALC, TRIG, CHOLHDL, LDLDIRECT in the last 72 hours. Thyroid Function Tests: No  results for input(s): TSH, T4TOTAL, FREET4, T3FREE, THYROIDAB in the last 72 hours. Anemia Panel: No results for input(s): VITAMINB12, FOLATE, FERRITIN, TIBC, IRON, RETICCTPCT in the last 72 hours. Sepsis Labs:  Recent Labs Lab 05/21/16 0153  LATICACIDVEN 1.80    Recent Results (from the past 240 hour(s))  Blood Culture (routine x 2)     Status: Abnormal   Collection Time: 05/21/16  1:38 AM  Result Value Ref Range Status   Specimen Description BLOOD RIGHT HAND DRAWN BY RN  Final   Special Requests BOTTLES DRAWN AEROBIC AND ANAEROBIC 8CC EACH  Final   Culture  Setup Time   Final    GRAM NEGATIVE RODS RECOVERED FROM THE AEROBIC BOTTLE Gram Stain Report Called to,Read Back By and Verified With: COVINGTON,L. AT [id number] ON 05/21/2016 BY BAUGHAM,M. Performed at [place] BY AND VERIFIED WITH: W BELIZAIRE,RN @0240  05/22/16 [email or web address] Performed at [place] [facility] (A)  Final   Report Status 05/23/2016 FINAL  Final   Organism ID, Bacteria ESCHERICHIA COLI  Final      Susceptibility   Escherichia coli - MIC*    AMPICILLIN <=2 SENSITIVE Sensitive     CEFAZOLIN <=4 SENSITIVE Sensitive     CEFEPIME <=1 SENSITIVE Sensitive     CEFTAZIDIME <=1 SENSITIVE Sensitive     CEFTRIAXONE <=1 SENSITIVE Sensitive     CIPROFLOXACIN <=0.25 SENSITIVE Sensitive     GENTAMICIN <=1 SENSITIVE Sensitive     IMIPENEM <=0.25 SENSITIVE Sensitive     TRIMETH/SULFA <=20 SENSITIVE Sensitive     AMPICILLIN/SULBACTAM <=2 SENSITIVE Sensitive     PIP/TAZO <=4 SENSITIVE Sensitive     Extended ESBL NEGATIVE Sensitive     * ESCHERICHIA COLI  Blood Culture ID Panel (Reflexed)     Status: Abnormal   Collection Time: 05/21/16  1:38 AM  Result Value Ref Range Status   Enterococcus species NOT DETECTED NOT DETECTED Final   Listeria monocytogenes NOT DETECTED NOT DETECTED Final   Staphylococcus species NOT DETECTED NOT DETECTED Final    Staphylococcus aureus NOT DETECTED NOT DETECTED Final   Streptococcus species NOT DETECTED NOT DETECTED Final   Streptococcus agalactiae NOT DETECTED NOT DETECTED Final   Streptococcus pneumoniae NOT DETECTED NOT DETECTED Final   Streptococcus pyogenes NOT DETECTED NOT DETECTED Final   Acinetobacter baumannii NOT DETECTED  NOT DETECTED Final   Enterobacteriaceae species DETECTED (A) NOT DETECTED Final    Comment: CRITICAL RESULT CALLED TO, READ BACK BY AND VERIFIED WITH: W BELIZAIRE,RN @0240  05/22/16 MKELLY,MLT    Enterobacter cloacae complex NOT DETECTED NOT DETECTED Final   Escherichia coli DETECTED (A) NOT DETECTED Final    Comment: CRITICAL RESULT CALLED TO, READ BACK BY AND VERIFIED WITH: W BELIZAIRE,RN @0240  05/22/16 MKELLY,MLT    Klebsiella oxytoca NOT DETECTED NOT DETECTED Final   Klebsiella pneumoniae NOT DETECTED NOT DETECTED Final   Proteus species NOT DETECTED NOT DETECTED Final   Serratia marcescens NOT DETECTED NOT DETECTED Final   Carbapenem resistance NOT DETECTED NOT DETECTED Final   Haemophilus influenzae NOT DETECTED NOT DETECTED Final   Neisseria meningitidis NOT DETECTED NOT DETECTED Final   Pseudomonas aeruginosa NOT DETECTED NOT DETECTED Final   Candida albicans NOT DETECTED NOT DETECTED Final   Candida glabrata NOT DETECTED NOT DETECTED Final   Candida krusei NOT DETECTED NOT DETECTED Final   Candida parapsilosis NOT DETECTED NOT DETECTED Final   Candida tropicalis NOT DETECTED NOT DETECTED Final    Comment: Performed at [facility]  Blood Culture (routine x 2)     Status: Abnormal   Collection Time: 05/21/16  1:48 AM  Result Value Ref Range Status   Specimen Description BLOOD LEFT ANTECUBITAL  Final   Special Requests   Final    BOTTLES DRAWN AEROBIC AND ANAEROBIC AEB=12CC ANA=10CC   Culture  Setup Time   Final    GRAM NEGATIVE RODS RECOVERED FROM THE AEROBIC BOTTLE Gram Stain Report Called to,Read Back By and Verified With: COVINGTON,L. AT [id number] ON  05/21/2016 BY BAUGHAM,M. Performed at [place]    Culture (A)  Final    ESCHERICHIA COLI SUSCEPTIBILITIES PERFORMED ON PREVIOUS CULTURE WITHIN THE LAST 5 DAYS. Performed at [facility]    Report Status 05/23/2016 FINAL  Final  Urine culture     Status: None   Collection Time: 05/21/16  3:51 AM  Result Value Ref Range Status   Specimen Description URINE, CLEAN CATCH  Final   Special Requests NONE  Final   Culture NO GROWTH Performed at [facility]   Final   Report Status 05/22/2016 FINAL  Final  MRSA PCR Screening     Status: None   Collection Time: 05/24/16  8:32 PM  Result Value Ref Range Status   MRSA by PCR NEGATIVE NEGATIVE Final    Comment:        The GeneXpert MRSA Assay (FDA approved for NASAL specimens only), is one component of a comprehensive MRSA colonization surveillance program. It is not intended to diagnose MRSA infection nor to guide or monitor treatment for MRSA infections.          Radiology Studies: No results found.      Scheduled Meds: . sodium chloride   Intravenous Once  . atorvastatin  40 mg Oral QHS  . carbidopa-levodopa  1 tablet Oral TID  . cefTRIAXone (ROCEPHIN)  IV  2 g Intravenous Q24H  . chlorhexidine  15 mL Mouth Rinse BID  . mouth rinse  15 mL Mouth Rinse q12n4p  . metoprolol  12.5 mg Oral BID  . pantoprazole  40 mg Oral Daily  . sodium chloride flush  3 mL Intravenous Q12H   Continuous Infusions:   LOS: 5 days    Time spent: 25 minutes     Kathie Dike, MD [facility] If  7PM-7AM, please contact night-coverage [email or web address] Password [facility] 05/26/2016, 7:38 AM

## 2016-05-27 ENCOUNTER — Encounter (HOSPITAL_COMMUNITY): Payer: Self-pay | Admitting: General Surgery

## 2016-05-27 ENCOUNTER — Ambulatory Visit (HOSPITAL_COMMUNITY): Payer: Medicare PPO | Attending: Internal Medicine

## 2016-05-27 ENCOUNTER — Inpatient Hospital Stay (HOSPITAL_COMMUNITY): Payer: Medicare PPO

## 2016-05-27 LAB — BASIC METABOLIC PANEL
Anion gap: 9 (ref 5–15)
BUN: 43 mg/dL — ABNORMAL HIGH (ref 6–20)
CALCIUM: 7.9 mg/dL — AB (ref 8.9–10.3)
CHLORIDE: 107 mmol/L (ref 101–111)
CO2: 21 mmol/L — ABNORMAL LOW (ref 22–32)
CREATININE: 1.24 mg/dL (ref 0.61–1.24)
GFR calc non Af Amer: 52 mL/min — ABNORMAL LOW (ref >60)
Glucose, Bld: 100 mg/dL — ABNORMAL HIGH (ref 65–99)
Potassium: 4.8 mmol/L (ref 3.5–5.1)
SODIUM: 137 mmol/L (ref 135–145)

## 2016-05-27 LAB — CBC
HCT: 27.9 % — ABNORMAL LOW (ref 39.0–52.0)
HEMOGLOBIN: 8.6 g/dL — AB (ref 13.0–17.0)
MCH: 23.8 pg — ABNORMAL LOW (ref 26.0–34.0)
MCHC: 30.8 g/dL (ref 30.0–36.0)
MCV: 77.1 fL — ABNORMAL LOW (ref 78.0–100.0)
Platelets: 536 10*3/uL — ABNORMAL HIGH (ref 150–400)
RBC: 3.62 MIL/uL — AB (ref 4.22–5.81)
RDW: 17.3 % — ABNORMAL HIGH (ref 11.5–15.5)
WBC: 42 10*3/uL — AB (ref 4.0–10.5)

## 2016-05-27 MED ORDER — PIPERACILLIN-TAZOBACTAM 3.375 G IVPB
3.3750 g | Freq: Three times a day (TID) | INTRAVENOUS | Status: DC
  Administered 2016-05-27 – 2016-06-01 (×15): 3.375 g via INTRAVENOUS
  Filled 2016-05-27 (×16): qty 50

## 2016-05-27 MED ORDER — FLUCONAZOLE IN SODIUM CHLORIDE 400-0.9 MG/200ML-% IV SOLN
400.0000 mg | INTRAVENOUS | Status: DC
  Administered 2016-05-27 – 2016-06-01 (×6): 400 mg via INTRAVENOUS
  Filled 2016-05-27 (×8): qty 200

## 2016-05-27 MED ORDER — FLUCONAZOLE IN SODIUM CHLORIDE 200-0.9 MG/100ML-% IV SOLN: 200.0000 mg | INTRAVENOUS | Status: DC

## 2016-05-27 MED ORDER — IOPAMIDOL (ISOVUE-300) INJECTION 61%
INTRAVENOUS | Status: AC
  Administered 2016-05-27: 30 mL via ORAL
  Filled 2016-05-27: qty 30

## 2016-05-27 MED ORDER — VANCOMYCIN HCL IN DEXTROSE 1-5 GM/200ML-% IV SOLN
1000.0000 mg | Freq: Once | INTRAVENOUS | Status: AC
  Administered 2016-05-27: 1000 mg via INTRAVENOUS

## 2016-05-27 MED ORDER — IOPAMIDOL (ISOVUE-300) INJECTION 61%
100.0000 mL | Freq: Once | INTRAVENOUS | Status: AC | PRN
  Administered 2016-05-27: 100 mL via INTRAVENOUS

## 2016-05-27 MED ORDER — LIDOCAINE VISCOUS 2 % MT SOLN
15.0000 mL | Freq: Four times a day (QID) | OROMUCOSAL | Status: DC
  Administered 2016-05-27 – 2016-06-03 (×23): 15 mL via OROMUCOSAL
  Filled 2016-05-27 (×18): qty 15

## 2016-05-27 MED ORDER — VANCOMYCIN HCL IN DEXTROSE 1-5 GM/200ML-% IV SOLN
1000.0000 mg | Freq: Once | INTRAVENOUS | Status: AC
Start: 2016-05-27 — End: 2016-05-27
  Administered 2016-05-27: 1000 mg via INTRAVENOUS

## 2016-05-27 MED ORDER — VANCOMYCIN HCL 10 G IV SOLR
1250.0000 mg | INTRAVENOUS | Status: AC
  Administered 2016-05-28 – 2016-05-30 (×3): 1250 mg via INTRAVENOUS
  Filled 2016-05-27 (×3): qty 1250

## 2016-05-27 MED ORDER — MAGIC MOUTHWASH
15.0000 mL | Freq: Four times a day (QID) | ORAL | Status: DC
  Administered 2016-05-27 (×2): 15 mL via ORAL
  Administered 2016-05-27: 5 mL via ORAL
  Administered 2016-05-28 – 2016-06-03 (×22): 15 mL via ORAL
  Filled 2016-05-27 (×20): qty 15

## 2016-05-27 NOTE — Progress Notes (Addendum)
PROGRESS NOTE    Jesus Maldonado  [id number] DOB: 1931/05/29 DOA: 05/21/2016 PCP: Kendrick Ranch, MD   Brief Narrative:  89 yom with a history of CAD s/p CABG x4. A-fib not on any anticoagulation, HTN, HLD, and parkinson disease, presented with complaints of abdominal pain. While in the ED, serology shows creatine 1.65, total bili 4.2, WBC 15.0, and hemoglobin 11.8. Abdominal CT is consistent with cholecystitis. General surgery and GI were consulted and he was admitted for further evaluation of acute cholecystitis. Blood cultures returned positive for E coli and he has been continued on rocephin. He underwent lap cholecystectomy in which he was found to have a gangrenous gallbladder.    Assessment & Plan:   Principal Problem:   Acute cholecystitis Active Problems:   HTN (hypertension)   Hyperlipidemia with target low density lipoprotein (LDL) cholesterol less than 70 mg/dL   CAD (coronary artery disease)   Atrial fibrillation (HCC)   Elevated LFTs   AKI (acute kidney injury) ([place])   E coli bacteremia   Parkinson's disease ([place])  1. Acute cholecystitis. Abdominal CT was consistent with cholecystitis. Patient underwent laparoscopic cholecystectomy on 11/17. Will continue with pain management with minimal narcotics per general surgery. 2. Escherichia coli sepsis. Blood cultures positive for Escherichia coli, likely from biliary source. Urine cultures show no growth. Leukocytosis has worsened to 42K today.He is afebrile and clinically does not appear to be doing any worse. No diarrhea. Will repeat blood cultures today. Check chest xray. Broaden antibiotic coverage to zosyn. Discussed with Dr. Arnoldo Morale and will repeat CT abd today.  3. HTN. Pressures are running low. Will continue to hold ramipril due to renal failure. Continue metoprolol with holding parameters. 4. AKI. Possibly secondary to episodes of hypotension he experienced 2 days ago. Patient does have evidence of LE edema  and blood pressures have stabilized. Will hold off on further IVF and monitor renal function. Creatinine has improved to 1.24.  5. Microcytic anemia. Post operatively, hemoglobin has slowly declined. It has been stable over the last 24 hours. Continue to monitor  6. A-fib. Patient is not anticoagulated on any medication due to fall risk. Rate remains controlled. Continue beta-blocker.  7. Lethargy. Likely related to hypotension as well as residual effect of anesthesia. Mental status back to baseline. Continue to monitor. 8. CAD hx. S/p 4 CABGs. Aspirin currently on hold.  9. HLD. Continue statin. 10. Anasarca. Patient has been receiving IV fluids and is noted to be hypoalbuminemic likely leading to third spacing of IV fluids. Will need to be cautious with further IV fluids. 11. Parkinson's Disease. Continue on sinimet  DVT prophylaxis: SCDs  Code Status: Full  Family Communication: discussed with daughter yesterday at bedside Disposition Plan: Discharge home once improved.    Consultants:   General surgery  Cardiology  GI   Procedures:   Echo  - Left ventricle: The cavity size was normal. Wall thickness was increased in a pattern of mild LVH. Systolic function was normal. The estimated ejection fraction was in the range of 60% to 65%. Wall motion was normal; there were no regional wall motion abnormalities. Doppler parameters are consistent with abnormal left ventricular relaxation (grade 1 diastolic dysfunction). - Aortic valve: Mildly calcified annulus. Trileaflet; mildly calcified leaflets. There was mild regurgitation. - Mitral valve: Calcified annulus. Mildly calcified leaflets . There was trivial regurgitation. - Right atrium: Central venous pressure (est): 3 mm Hg. - Tricuspid valve: There was trivial regurgitation. - Pulmonary arteries: PA peak pressure: 25 mm Hg (  S). - Pericardium, extracardiac: There was no pericardial effusion.  Transfusion of 2 units  of PRBCs 11/20  Antimicrobials:  Vancomycin 11/14 >>11/15  Zosyn 11/14>>11/16  Rocephin 11/16>>   Subjective: Denies any abdominal pain. Had bowel movements with laxatives. Still as some nausea. No shortness of breath  Objective: Vitals:   05/27/16 0300 05/27/16 0400 05/27/16 0500 05/27/16 0600  BP: (!) 116/58 (!) 125/55 125/60 (!) 121/58  Pulse: 88 87 95 74  Resp: 17 17 16  (!) 23  Temp:  97.7 F (36.5 C)    TempSrc:  Oral    SpO2: 97% 96% 98% 99%  Weight:   108.1 kg (238 lb 5.1 oz)   Height:        Intake/Output Summary (Last 24 hours) at 05/27/16 0729 Last data filed at 05/27/16 0500  Gross per 24 hour  Intake              533 ml  Output              977 ml  Net             -444 ml   Filed Weights   05/25/16 0400 05/26/16 0500 05/27/16 0500  Weight: 108 kg (238 lb 1.6 oz) 108.1 kg (238 lb 5.1 oz) 108.1 kg (238 lb 5.1 oz)    Examination:  General exam: Appears calm and comfortable  Respiratory system: Clear to auscultation. Respiratory effort normal. Cardiovascular system: S1 & S2 heard, RRR. No JVD, murmurs, rubs, gallops or clicks. 1+ pedal edema. Gastrointestinal system: Abdomen is nondistended, soft and nontender. No organomegaly or masses felt. Normal bowel sounds heard. Central nervous system: Alert and oriented. No focal neurological deficits. Extremities: Symmetric 5 x 5 power. Skin: No rashes, lesions or ulcers Psychiatry: Judgement and insight appear normal. Mood & affect appropriate.     Data Reviewed: I have personally reviewed following labs and imaging studies  CBC:  Recent Labs Lab 05/21/16 0137  05/23/16 0615 05/24/16 0612 05/24/16 1734 05/25/16 0500 05/26/16 0443  WBC 15.0*  < > 15.2* 15.0* 31.9* 24.9* 32.3*  NEUTROABS 13.5*  --   --   --  29.3*  --   --   HGB 11.8*  < > 10.6* 11.5* 9.7* 9.1* 8.2*  HCT 36.3*  < > 34.3* 36.6* 31.5* 29.5* 26.2*  MCV 75.2*  < > 76.2* 76.3* 76.6* 76.2* 75.5*  PLT 227  < > 240 264 304 349 409*  < >  = values in this interval not displayed. Basic Metabolic Panel:  Recent Labs Lab 05/22/16 0617 05/23/16 0615 05/24/16 0612 05/25/16 0500 05/26/16 0443  NA 136 137 137 136 138  K 3.9 3.9 4.0 4.8 4.2  CL 106 106 104 107 106  CO2 22 25 25  19* 24  GLUCOSE 88 87 91 129* 120*  BUN 26* 24* 19 32* 40*  CREATININE 1.31* 1.21 1.16 1.98* 1.62*  CALCIUM 7.9* 8.0* 8.2* 7.7* 8.0*  MG  --   --   --  1.9  --   PHOS  --   --   --  4.3  --    GFR: Estimated Creatinine Clearance: 42.5 mL/min (by C-G formula based on SCr of 1.62 mg/dL (H)). Liver Function Tests:  Recent Labs Lab 05/21/16 0137 05/21/16 0203 05/22/16 0617 05/23/16 0621 05/25/16 0500 05/26/16 0443  AST 418*  --  199* 112* 115* 61*  ALT 220*  --  54 76* 112* 40  ALKPHOS 398*  --  326* 291*  221* 201*  BILITOT 4.2* 4.4* 3.6* 1.7* 0.7 0.7  PROT 6.8  --  5.7* 5.6* 5.4* 5.7*  ALBUMIN 2.8*  --  2.2* 2.0* 2.1* 2.1*    Recent Labs Lab 05/21/16 0137  LIPASE 31   No results for input(s): AMMONIA in the last 168 hours. Coagulation Profile: No results for input(s): INR, PROTIME in the last 168 hours. Cardiac Enzymes: No results for input(s): CKTOTAL, CKMB, CKMBINDEX, TROPONINI in the last 168 hours. BNP (last 3 results) No results for input(s): PROBNP in the last 8760 hours. HbA1C: No results for input(s): HGBA1C in the last 72 hours. CBG: No results for input(s): GLUCAP in the last 168 hours. Lipid Profile: No results for input(s): CHOL, HDL, LDLCALC, TRIG, CHOLHDL, LDLDIRECT in the last 72 hours. Thyroid Function Tests: No results for input(s): TSH, T4TOTAL, FREET4, T3FREE, THYROIDAB in the last 72 hours. Anemia Panel: No results for input(s): VITAMINB12, FOLATE, FERRITIN, TIBC, IRON, RETICCTPCT in the last 72 hours. Sepsis Labs:  Recent Labs Lab 05/21/16 0153  LATICACIDVEN 1.80    Recent Results (from the past 240 hour(s))  Blood Culture (routine x 2)     Status: Abnormal   Collection Time: 05/21/16  1:38 AM    Result Value Ref Range Status   Specimen Description BLOOD RIGHT HAND DRAWN BY RN  Final   Special Requests BOTTLES DRAWN AEROBIC AND ANAEROBIC 8CC EACH  Final   Culture  Setup Time   Final    GRAM NEGATIVE RODS RECOVERED FROM THE AEROBIC BOTTLE Gram Stain Report Called to,Read Back By and Verified With: COVINGTON,L. AT [id number] ON 05/21/2016 BY BAUGHAM,M. Performed at [facility] BY AND VERIFIED WITH: W BELIZAIRE,RN @0240  05/22/16 [email or web address] Performed at [facility] (A)  Final   Report Status 05/23/2016 FINAL  Final   Organism ID, Bacteria ESCHERICHIA COLI  Final      Susceptibility   Escherichia coli - MIC*    AMPICILLIN <=2 SENSITIVE Sensitive     CEFAZOLIN <=4 SENSITIVE Sensitive     CEFEPIME <=1 SENSITIVE Sensitive     CEFTAZIDIME <=1 SENSITIVE Sensitive     CEFTRIAXONE <=1 SENSITIVE Sensitive     CIPROFLOXACIN <=0.25 SENSITIVE Sensitive     GENTAMICIN <=1 SENSITIVE Sensitive     IMIPENEM <=0.25 SENSITIVE Sensitive     TRIMETH/SULFA <=20 SENSITIVE Sensitive     AMPICILLIN/SULBACTAM <=2 SENSITIVE Sensitive     PIP/TAZO <=4 SENSITIVE Sensitive     Extended ESBL NEGATIVE Sensitive     * ESCHERICHIA COLI  Blood Culture ID Panel (Reflexed)     Status: Abnormal   Collection Time: 05/21/16  1:38 AM  Result Value Ref Range Status   Enterococcus species NOT DETECTED NOT DETECTED Final   Listeria monocytogenes NOT DETECTED NOT DETECTED Final   Staphylococcus species NOT DETECTED NOT DETECTED Final   Staphylococcus aureus NOT DETECTED NOT DETECTED Final   Streptococcus species NOT DETECTED NOT DETECTED Final   Streptococcus agalactiae NOT DETECTED NOT DETECTED Final   Streptococcus pneumoniae NOT DETECTED NOT DETECTED Final   Streptococcus pyogenes NOT DETECTED NOT DETECTED Final   Acinetobacter baumannii NOT DETECTED NOT DETECTED Final   Enterobacteriaceae species DETECTED (A) NOT DETECTED Final     Comment: CRITICAL RESULT CALLED TO, READ BACK BY AND VERIFIED WITH: W BELIZAIRE,RN @0240  05/22/16 MKELLY,MLT    Enterobacter cloacae complex NOT DETECTED NOT DETECTED Final   Escherichia coli DETECTED (A) NOT DETECTED Final  Comment: CRITICAL RESULT CALLED TO, READ BACK BY AND VERIFIED WITH: W BELIZAIRE,RN @0240  05/22/16 MKELLY,MLT    Klebsiella oxytoca NOT DETECTED NOT DETECTED Final   Klebsiella pneumoniae NOT DETECTED NOT DETECTED Final   Proteus species NOT DETECTED NOT DETECTED Final   Serratia marcescens NOT DETECTED NOT DETECTED Final   Carbapenem resistance NOT DETECTED NOT DETECTED Final   Haemophilus influenzae NOT DETECTED NOT DETECTED Final   Neisseria meningitidis NOT DETECTED NOT DETECTED Final   Pseudomonas aeruginosa NOT DETECTED NOT DETECTED Final   Candida albicans NOT DETECTED NOT DETECTED Final   Candida glabrata NOT DETECTED NOT DETECTED Final   Candida krusei NOT DETECTED NOT DETECTED Final   Candida parapsilosis NOT DETECTED NOT DETECTED Final   Candida tropicalis NOT DETECTED NOT DETECTED Final    Comment: Performed at [facility]  Blood Culture (routine x 2)     Status: Abnormal   Collection Time: 05/21/16  1:48 AM  Result Value Ref Range Status   Specimen Description BLOOD LEFT ANTECUBITAL  Final   Special Requests   Final    BOTTLES DRAWN AEROBIC AND ANAEROBIC AEB=12CC ANA=10CC   Culture  Setup Time   Final    GRAM NEGATIVE RODS RECOVERED FROM THE AEROBIC BOTTLE Gram Stain Report Called to,Read Back By and Verified With: COVINGTON,L. AT [id number] ON 05/21/2016 BY BAUGHAM,M. Performed at [facility]    Culture (A)  Final    ESCHERICHIA COLI SUSCEPTIBILITIES PERFORMED ON PREVIOUS CULTURE WITHIN THE LAST 5 DAYS. Performed at [facility]    Report Status 05/23/2016 FINAL  Final  Urine culture     Status: None   Collection Time: 05/21/16  3:51 AM  Result Value Ref Range Status   Specimen  Description URINE, CLEAN CATCH  Final   Special Requests NONE  Final   Culture NO GROWTH Performed at [facility]   Final   Report Status 05/22/2016 FINAL  Final  MRSA PCR Screening     Status: None   Collection Time: 05/24/16  8:32 PM  Result Value Ref Range Status   MRSA by PCR NEGATIVE NEGATIVE Final    Comment:        The GeneXpert MRSA Assay (FDA approved for NASAL specimens only), is one component of a comprehensive MRSA colonization surveillance program. It is not intended to diagnose MRSA infection nor to guide or monitor treatment for MRSA infections.          Radiology Studies: No results found.      Scheduled Meds: . atorvastatin  40 mg Oral QHS  . bisacodyl  10 mg Rectal BID  . carbidopa-levodopa  1 tablet Oral TID  . cefTRIAXone (ROCEPHIN)  IV  2 g Intravenous Q24H  . chlorhexidine  15 mL Mouth Rinse BID  . magic mouthwash w/lidocaine  15 mL Oral QID  . magnesium hydroxide  30 mL Oral Daily  . mouth rinse  15 mL Mouth Rinse q12n4p  . metoprolol  12.5 mg Oral BID  . pantoprazole  40 mg Oral Daily  . sodium chloride flush  3 mL Intravenous Q12H   Continuous Infusions:   LOS: 6 days    Time spent: 25 minutes     Kathie Dike, MD [facility] If 7PM-7AM, please contact night-coverage [email or web address] Password [email or web address] 05/27/2016, 7:29 AM   Addendum 17:00:  CT abdomen discussed with Dr. Arnoldo Morale. Possible hematoma in the gallbladder fossa. Antibiotic coverage broadened to Zosyn. Also added vancomycin.  Consult for interventional radiology placed to review if drain placement. Appropriate to drain hematoma. Daughter was also informed.  MEMON,JEHANZEB

## 2016-05-27 NOTE — Progress Notes (Addendum)
Pharmacy Antibiotic Note  Jesus Maldonado is a 82 y.o. male admitted on 05/21/2016 with intra-abdominal infection.  Pharmacy has been consulted for Zosyn dosing. WBC increasing, broadening coverage. CT of abdomen today.  Obesity/Normalized CrCl Vancomycin dosing protocol will be initiated with an estimated normalized CrCl = 45 ml/min.   Plan: Zosyn 3.375g IV q8h (4 hour infusion). F/U cultures and clinical progress Monitor labs Vancomycin 2gm IV x 1, then 1250mg  IV every 24 hours. Monitor labs, micro and vitals.  Goal Trough = 15-20.  Height: 5\' 10"  (177.8 cm) Weight: 238 lb 5.1 oz (108.1 kg) IBW/kg (Calculated) : 73  Temp (24hrs), Avg:97.5 F (36.4 C), Min:96.9 F (36.1 C), Max:98.1 F (36.7 C)   Recent Labs Lab 05/21/16 0153  05/23/16 0615 05/24/16 0612 05/24/16 1734 05/25/16 0500 05/26/16 0443 05/27/16 0510 05/27/16 0840  WBC  --   < > 15.2* 15.0* 31.9* 24.9* 32.3*  --  42.0*  CREATININE  --   < > 1.21 1.16  --  1.98* 1.62* 1.24  --   LATICACIDVEN 1.80  --   --   --   --   --   --   --   --   < > = values in this interval not displayed.  Estimated Creatinine Clearance: 55.5 mL/min (by C-G formula based on SCr of 1.24 mg/dL).    No Known Allergies  Antimicrobials this admission: Vancomycin 11/14 >> 11/15 now restart 11/20>> Zosyn 11/14 >> 11/16 Ceftriaxone 11/16>>11/20  Dose adjustments this admission: n/a  Microbiology results: 11/14 BCx: E.Col x 2 bottles which are pans sensitive 11/14 UCx: no growth 11/20 BCx: pending 11/17 MRSA: (-)  Thank you for allowing pharmacy to be a part of this patient's care.  Pricilla Larsson, [facility] 05/27/2016 10:37 AM

## 2016-05-27 NOTE — Progress Notes (Signed)
3 Days Post-Op  Subjective: Patient denies any abdominal pain. Is somewhat nauseated.  Objective: Vital signs in last 24 hours: Temp:  [96.9 F (36.1 C)-98.1 F (36.7 C)] 97.6 F (36.4 C) (11/20 0741) Pulse Rate:  [74-98] 92 (11/20 0741) Resp:  [15-26] 19 (11/20 0741) BP: (111-167)/(54-81) 121/58 (11/20 0600) SpO2:  [95 %-99 %] 97 % (11/20 0741) Weight:  [108.1 kg (238 lb 5.1 oz)] 108.1 kg (238 lb 5.1 oz) (11/20 0500)    Intake/Output from previous day: 11/19 0701 - 11/20 0700 In: 533 [P.O.:480; I.V.:3; IV Piggyback:50] Out: 977 [Urine:975; Stool:2] Intake/Output this shift: Total I/O In: 30 [P.O.:30] Out: -   General appearance: alert, cooperative and no distress GI: Soft, occasional bowel sounds appreciated. No tense distention. No tenderness noted. Incisions healing well.  Lab Results:   Recent Labs  05/26/16 0443 05/27/16 0840  WBC 32.3* 42.0*  HGB 8.2* 8.6*  HCT 26.2* 27.9*  PLT 409* 536*   BMET  Recent Labs  05/26/16 0443 05/27/16 0510  NA 138 137  K 4.2 4.8  CL 106 107  CO2 24 21*  GLUCOSE 120* 100*  BUN 40* 43*  CREATININE 1.62* 1.24  CALCIUM 8.0* 7.9*   PT/INR No results for input(s): LABPROT, INR in the last 72 hours.  Studies/Results: No results found.  Anti-infectives: Anti-infectives    Start     Dose/Rate Route Frequency Ordered Stop   05/27/16 1130  fluconazole (DIFLUCAN) IVPB 200 mg  Status:  Discontinued     200 mg 100 mL/hr over 60 Minutes Intravenous Every 24 hours 05/27/16 1115 05/27/16 1116   05/27/16 1130  fluconazole (DIFLUCAN) IVPB 400 mg     400 mg 100 mL/hr over 120 Minutes Intravenous Every 24 hours 05/27/16 1116     05/27/16 1100  piperacillin-tazobactam (ZOSYN) IVPB 3.375 g     3.375 g 12.5 mL/hr over 240 Minutes Intravenous Every 8 hours 05/27/16 1037     05/23/16 2000  cefTRIAXone (ROCEPHIN) 2 g in dextrose 5 % 50 mL IVPB  Status:  Discontinued     2 g 100 mL/hr over 30 Minutes Intravenous Every 24 hours  05/23/16 1904 05/27/16 0932   05/21/16 1800  vancomycin (VANCOCIN) IVPB 1000 mg/200 mL premix  Status:  Discontinued     1,000 mg 200 mL/hr over 60 Minutes Intravenous Every 24 hours 05/21/16 0753 05/22/16 1559   05/21/16 1000  piperacillin-tazobactam (ZOSYN) IVPB 3.375 g  Status:  Discontinued     3.375 g 12.5 mL/hr over 240 Minutes Intravenous Every 8 hours 05/21/16 0749 05/23/16 1904   05/21/16 0315  metroNIDAZOLE (FLAGYL) IVPB 500 mg     500 mg 100 mL/hr over 60 Minutes Intravenous  Once 05/21/16 0304 05/21/16 0450   05/21/16 0145  piperacillin-tazobactam (ZOSYN) IVPB 3.375 g     3.375 g 100 mL/hr over 30 Minutes Intravenous  Once 05/21/16 0135 05/21/16 0218   05/21/16 0145  vancomycin (VANCOCIN) IVPB 1000 mg/200 mL premix     1,000 mg 200 mL/hr over 60 Minutes Intravenous  Once 05/21/16 0135 05/21/16 0300      Assessment/Plan: s/p Procedure(s): LAPAROSCOPIC CHOLECYSTECTOMY Impression: Vital signs are stable and patient is afebrile, but his leukocytosis is worsening. Question intra-abdominal abscess versus leukemoid reaction. Agree with switching to Zosyn. Will add Diflucan. Agree with CT scan of the abdomen to rule out a biloma or infected hematoma. I discussed this with the patient's daughter Bethena Roys.  LOS: 6 days    Lorry Anastasi A 05/27/2016

## 2016-05-27 NOTE — Progress Notes (Signed)
Dr. Charlynne Pander call and update daughter Bethena Roys after doing rounds in the morning. Thanks.

## 2016-05-27 NOTE — Evaluation (Signed)
Clinical/Bedside Swallow Evaluation Patient Details  Name: Jesus Maldonado MRN: [id number] Date of Birth: Dec 05, 1930  Today's Date: 05/27/2016 Time: SLP Start Time (ACUTE ONLY): 1340 SLP Stop Time (ACUTE ONLY): 1443 SLP Time Calculation (min) (ACUTE ONLY): 63 min  Past Medical History:  Past Medical History:  Diagnosis Date  . Arthritis   . Coronary artery disease    Multivessel s/p remote PCI and CABG 2013  . Essential hypertension   . Hypercholesteremia   . Myocardial infarct   . Parkinson disease [facility])    Past Surgical History:  Past Surgical History:  Procedure Laterality Date  . CHOLECYSTECTOMY N/A 05/24/2016   Procedure: LAPAROSCOPIC CHOLECYSTECTOMY;  Surgeon: Aviva Signs, MD;  Location: [facility];  Service: General;  Laterality: N/A;  . CORONARY ARTERY BYPASS GRAFT  11/01/2011   Procedure: CORONARY ARTERY BYPASS GRAFTING (CABG);  Surgeon: Melrose Nakayama, MD;  Location: [facility];  Service: Open Heart Surgery;  Laterality: N/A;  Coronary Artery bypass graft times four utilizing the left internal mammary artery and the right greater saphenous vein harvested endoscopically.  Marland Kitchen HEMORROIDECTOMY    . LEFT HEART CATHETERIZATION WITH CORONARY ANGIOGRAM N/A 10/28/2011   Procedure: LEFT HEART CATHETERIZATION WITH CORONARY ANGIOGRAM;  Surgeon: Burnell Blanks, MD;  Location: [facility] CATH LAB;  Service: Cardiovascular;  Laterality: N/A;  . PERCUTANEOUS CORONARY INTERVENTION-BALLOON ONLY  10/28/2011   Procedure: PERCUTANEOUS CORONARY INTERVENTION-BALLOON ONLY;  Surgeon: Burnell Blanks, MD;  Location: [facility] CATH LAB;  Service: Cardiovascular;;  . STERNAL INCISION RECLOSURE  11/06/2011   Procedure: STERNAL REWIRING;  Surgeon: Melrose Nakayama, MD;  Location: [place];  Service: Open Heart Surgery;  Laterality: N/A;   HPI:  22 yom with a history of CAD s/p CABG x4. A-fib not on any anticoagulation, HTN, HLD, and parkinson disease, presented with complaints of abdominal pain. While in the  ED, serology shows creatine 1.65, total bili 4.2, WBC 15.0, and hemoglobin 11.8. Abdominal CT is consistent with cholecystitis. General surgery and GI were consulted and he was admitted for further evaluation of acute cholecystitis. Blood cultures returned positive for E coli and he has been continued on rocephin. He underwent lap cholecystectomy in which he was found to have a gangrenous gallbladder.   Assessment / Plan / Recommendation Clinical Impression  Pt seen at bedside for clinical swallow evaluation due to reports of pt holding po orally and complaining of pain with swallow. Daughter present for evaluation. Pt able to follow commands with delay due to slow processing and execution of motor movements. Pt has Parkinson's and did not receive his medication today. Daughter reports that PD is mostly seen in his right leg with ambulation. Oral motor examination reveals coated posterior dorsum of tongue (green/black). SLP completed oral care with toothbrush to tongue to remove, however much still remains. Pt able to take ice chips, thin water, and bites of puree with mod/max verbal/tactile cues. Pt benefited from self feeding with SLP providing hand over hand support/assist due to decreased initiation with PD. Pt also responded well to verbal cues to "move your tongue, move your lips, swallow...". Extensive education provided to pt and daughter. Recommend D1/puree and thin liquids when pt is alert and upright. He will need 100% feeder assist and cues for intake. It is prudent for him to get his Parkinson's medications (3x/day) and oral care. SLP will follow tomorrow. Discontinue po if pt coughing with intake or holding excessively. If he is unable to swallow his Sinemet, he will need it via alternative means.  Above to pt, daughter, and RN Mikki Santee).     Aspiration Risk  Mild aspiration risk    Diet Recommendation Dysphagia 1 (Puree);Thin liquid   Liquid Administration via: Cup;No straw Medication  Administration: Whole meds with puree Supervision: Staff to assist with self feeding;Full supervision/cueing for compensatory strategies Compensations: Slow rate;Follow solids with liquid Postural Changes: Seated upright at 90 degrees;Remain upright for at least 30 minutes after po intake    Other  Recommendations Oral Care Recommendations: Staff/trained caregiver to provide oral care (per protocol) Other Recommendations: Clarify dietary restrictions;Have oral suction available   Follow up Recommendations  (pending clinical course)      Frequency and Duration min 2x/week  1 week       Prognosis Prognosis for Safe Diet Advancement: [place] Date of Onset: 05/21/16 HPI: 41 yom with a history of CAD s/p CABG x4. A-fib not on any anticoagulation, HTN, HLD, and parkinson disease, presented with complaints of abdominal pain. While in the ED, serology shows creatine 1.65, total bili 4.2, WBC 15.0, and hemoglobin 11.8. Abdominal CT is consistent with cholecystitis. General surgery and GI were consulted and he was admitted for further evaluation of acute cholecystitis. Blood cultures returned positive for E coli and he has been continued on rocephin. He underwent lap cholecystectomy in which he was found to have a gangrenous gallbladder. Type of Study: Bedside Swallow Evaluation Diet Prior to this Study: NPO Temperature Spikes Noted: No Respiratory Status: Nasal cannula History of Recent Intubation: Yes Length of Intubations (days): 1 days Date extubated: 05/24/16 Behavior/Cognition: Alert;Cooperative;Pleasant mood;Requires cueing Oral Cavity Assessment: Other (comment) (lingual secretions) Oral Care Completed by SLP: Yes Oral Cavity - Dentition:  (has lower dentition, upper dentures out) Vision: Functional for self-feeding Self-Feeding Abilities: Needs assist;Able to feed self (hand over hand assist) Patient Positioning: Upright in bed Baseline Vocal Quality:  Normal;Low vocal intensity Volitional Cough: Weak Volitional Swallow: Able to elicit    Oral/Motor/Sensory Function Overall Oral Motor/Sensory Function: Mild impairment (flat) Facial ROM: Within Functional Limits Facial Symmetry: Within Functional Limits Facial Strength: Within Functional Limits Facial Sensation: Within Functional Limits Lingual ROM: Within Functional Limits Lingual Symmetry: Within Functional Limits Lingual Strength: Reduced Lingual Sensation: Within Functional Limits Velum: Within Functional Limits Mandible: Within Functional Limits   Ice Chips Ice chips: Within functional limits Presentation: Spoon   Thin Liquid Thin Liquid: Within functional limits Presentation: Cup    Nectar Thick Nectar Thick Liquid: Not tested   Honey Thick Honey Thick Liquid: Not tested   Puree Puree: Impaired Presentation: Spoon Oral Phase Impairments: Reduced lingual movement/coordination Oral Phase Functional Implications: Prolonged oral transit;Oral holding   Solid   Thank you,  Genene Churn, CCC-SLP [phone]    Solid: Not tested (pill with water presented)        Jesly Hartmann 05/27/2016,2:53 PM

## 2016-05-28 ENCOUNTER — Encounter (HOSPITAL_COMMUNITY): Payer: Self-pay | Admitting: General Surgery

## 2016-05-28 ENCOUNTER — Ambulatory Visit (HOSPITAL_COMMUNITY)
Admission: RE | Admit: 2016-05-28 | Discharge: 2016-05-28 | Disposition: A | Discharge status: Home / Self Care | Payer: Medicare PPO | Source: Ambulatory Visit | Attending: Internal Medicine | Admitting: Internal Medicine

## 2016-05-28 DIAGNOSIS — G20 Parkinson's disease: Secondary | ICD-10-CM

## 2016-05-28 DIAGNOSIS — K810 Acute cholecystitis: Secondary | ICD-10-CM

## 2016-05-28 DIAGNOSIS — Z951 Presence of aortocoronary bypass graft: Secondary | ICD-10-CM | POA: Insufficient documentation

## 2016-05-28 DIAGNOSIS — I10 Essential (primary) hypertension: Secondary | ICD-10-CM

## 2016-05-28 DIAGNOSIS — I2510 Atherosclerotic heart disease of native coronary artery without angina pectoris: Secondary | ICD-10-CM

## 2016-05-28 DIAGNOSIS — M1990 Unspecified osteoarthritis, unspecified site: Secondary | ICD-10-CM | POA: Insufficient documentation

## 2016-05-28 DIAGNOSIS — E7800 Pure hypercholesterolemia, unspecified: Secondary | ICD-10-CM | POA: Insufficient documentation

## 2016-05-28 DIAGNOSIS — I252 Old myocardial infarction: Secondary | ICD-10-CM | POA: Insufficient documentation

## 2016-05-28 LAB — CBC WITH DIFFERENTIAL/PLATELET
BASOS PCT: 0 %
Basophils Absolute: 0 10*3/uL (ref 0.0–0.1)
EOS ABS: 0.3 10*3/uL (ref 0.0–0.7)
Eosinophils Relative: 1 %
HCT: 24.8 % — ABNORMAL LOW (ref 39.0–52.0)
Hemoglobin: 7.5 g/dL — ABNORMAL LOW (ref 13.0–17.0)
Lymphocytes Relative: 6 %
Lymphs Abs: 1.9 10*3/uL (ref 0.7–4.0)
MCH: 23.6 pg — AB (ref 26.0–34.0)
MCHC: 30.2 g/dL (ref 30.0–36.0)
MCV: 78 fL (ref 78.0–100.0)
MONO ABS: 1.6 10*3/uL — AB (ref 0.1–1.0)
Monocytes Relative: 5 %
NEUTROS ABS: 28.3 10*3/uL — AB (ref 1.7–7.7)
NEUTROS PCT: 88 %
PLATELETS: 680 10*3/uL — AB (ref 150–400)
RBC: 3.18 MIL/uL — ABNORMAL LOW (ref 4.22–5.81)
RDW: 17.4 % — AB (ref 11.5–15.5)
WBC: 32.1 10*3/uL — ABNORMAL HIGH (ref 4.0–10.5)

## 2016-05-28 LAB — COMPREHENSIVE METABOLIC PANEL
ALK PHOS: 201 U/L — AB (ref 38–126)
ALT: 32 U/L (ref 17–63)
AST: 45 U/L — ABNORMAL HIGH (ref 15–41)
Albumin: 2.1 g/dL — ABNORMAL LOW (ref 3.5–5.0)
Anion gap: 5 (ref 5–15)
BUN: 44 mg/dL — ABNORMAL HIGH (ref 6–20)
CALCIUM: 7.8 mg/dL — AB (ref 8.9–10.3)
CO2: 27 mmol/L (ref 22–32)
CREATININE: 1.37 mg/dL — AB (ref 0.61–1.24)
Chloride: 105 mmol/L (ref 101–111)
GFR calc non Af Amer: 46 mL/min — ABNORMAL LOW (ref >60)
GFR, EST AFRICAN AMERICAN: 53 mL/min — AB (ref >60)
GLUCOSE: 103 mg/dL — AB (ref 65–99)
Potassium: 4.4 mmol/L (ref 3.5–5.1)
SODIUM: 137 mmol/L (ref 135–145)
Total Bilirubin: 1.2 mg/dL (ref 0.3–1.2)
Total Protein: 5.6 g/dL — ABNORMAL LOW (ref 6.5–8.1)

## 2016-05-28 LAB — TYPE AND SCREEN
ABO/RH(D): A POS
Antibody Screen: NEGATIVE
UNIT DIVISION: 0
UNIT DIVISION: 0
Unit division: 0
Unit division: 0

## 2016-05-28 LAB — PROTIME-INR
INR: 1.15
Prothrombin Time: 14.7 seconds (ref 11.4–15.2)

## 2016-05-28 LAB — C DIFFICILE QUICK SCREEN W PCR REFLEX
C DIFFICLE (CDIFF) ANTIGEN: NEGATIVE
C Diff interpretation: NOT DETECTED
C Diff toxin: NEGATIVE

## 2016-05-28 LAB — PREPARE RBC (CROSSMATCH)

## 2016-05-28 LAB — APTT: aPTT: 26 seconds (ref 24–36)

## 2016-05-28 MED ORDER — LIDOCAINE HCL 1 % IJ SOLN
INTRAMUSCULAR | Status: AC
  Filled 2016-05-28: qty 20

## 2016-05-28 MED ORDER — FENTANYL CITRATE (PF) 100 MCG/2ML IJ SOLN
INTRAMUSCULAR | Status: AC
  Filled 2016-05-28: qty 2

## 2016-05-28 MED ORDER — FUROSEMIDE 10 MG/ML IJ SOLN
20.0000 mg | Freq: Once | INTRAMUSCULAR | Status: AC
  Administered 2016-05-29: 20 mg via INTRAVENOUS
  Filled 2016-05-28: qty 2

## 2016-05-28 MED ORDER — SODIUM CHLORIDE 0.9 % IV SOLN
Freq: Once | INTRAVENOUS | Status: AC
  Administered 2016-05-28: 18:00:00 via INTRAVENOUS

## 2016-05-28 MED ORDER — FUROSEMIDE 10 MG/ML IJ SOLN
20.0000 mg | Freq: Every day | INTRAMUSCULAR | Status: DC
  Administered 2016-05-28 – 2016-06-03 (×7): 20 mg via INTRAVENOUS
  Filled 2016-05-28 (×7): qty 2

## 2016-05-28 MED ORDER — FENTANYL CITRATE (PF) 100 MCG/2ML IJ SOLN
INTRAMUSCULAR | Status: AC | PRN
  Administered 2016-05-28 (×2): 12.5 ug via INTRAVENOUS

## 2016-05-28 NOTE — Sedation Documentation (Signed)
Jesus Maldonado, [place] and Geneal Huebert, RN spoke with pts dtr for consent. Pt lethargic - answers questions mostly appropriate

## 2016-05-28 NOTE — Sedation Documentation (Signed)
Patient is resting comfortably. 

## 2016-05-28 NOTE — Care Management Note (Signed)
Case Management Note  Patient Details  Name: Jesus Maldonado MRN: [id number] Date of Birth: [birth year]  If discussed at [place] Length of Stay Meetings, dates discussed:  05/28/2016   Sherald Barge, RN 05/28/2016, 2:17 PM

## 2016-05-28 NOTE — Sedation Documentation (Addendum)
Sitting up in bed, drinking water by himself. Answers questions appropriately

## 2016-05-28 NOTE — Sedation Documentation (Addendum)
Drain placed successfully R low abd- and to JP bulb suction drain- old blood aspirated

## 2016-05-28 NOTE — Progress Notes (Signed)
Patient ID: Jesus Maldonado, male   DOB: 1931-04-07, 82 y.o.   MRN: [id number]   Request made for GB fossa abscess/hematoma aspiration/drain placement Dr Barbie Banner has reviewed imaging and approves procedure  Dr Barbie Banner is discussing with Dr Arnoldo Morale  I have spoken to RN She will have pt to [facility] via ambulance asap. Pt consents for self RN is calling wife.  RN is aware pt will return to [facility] after procedure

## 2016-05-28 NOTE — Progress Notes (Signed)
4 Days Post-Op  Subjective: Patient has no complaints of pain. Has had multiple bowel movements.  Objective: Vital signs in last 24 hours: Temp:  [97 F (36.1 C)-98.2 F (36.8 C)] 97.2 F (36.2 C) (11/21 0724) Pulse Rate:  [92-102] 93 (11/21 0724) Resp:  [14-24] 14 (11/21 0724) BP: (179)/(77) 179/77 (11/20 2226) SpO2:  [94 %-99 %] 94 % (11/21 0724) Last BM Date: 05/27/16  Intake/Output from previous day: 11/20 0701 - 11/21 0700 In: 230 [P.O.:30; IV Piggyback:200] Out: 355 [Urine:350; Stool:5] Intake/Output this shift: No intake/output data recorded.  General appearance: alert, cooperative, fatigued and no distress GI: Soft. Incisions healing well. No rigidity noted. Bowel sounds appreciated.  Lab Results:   Recent Labs  05/27/16 0840 05/28/16 0420  WBC 42.0* 32.1*  HGB 8.6* 7.5*  HCT 27.9* 24.8*  PLT 536* 680*   BMET  Recent Labs  05/27/16 0510 05/28/16 0420  NA 137 137  K 4.8 4.4  CL 107 105  CO2 21* 27  GLUCOSE 100* 103*  BUN 43* 44*  CREATININE 1.24 1.37*  CALCIUM 7.9* 7.8*   PT/INR  Recent Labs  05/28/16 0651  LABPROT 14.7  INR 1.15    Studies/Results: Ct Abdomen Pelvis W Contrast  Result Date: 05/27/2016 CLINICAL DATA:  Leukocytosis. Status post laparoscopic cholecystectomy November 17th for gangrenous cholecystitis, subsequent E coli sepsis. EXAM: CT ABDOMEN AND PELVIS WITH CONTRAST TECHNIQUE: Multidetector CT imaging of the abdomen and pelvis was performed using the standard protocol following bolus administration of intravenous contrast. CONTRAST:  135mL ISOVUE-300 IOPAMIDOL (ISOVUE-300) INJECTION 61% COMPARISON:  None. FINDINGS: LOWER CHEST: Small bilateral pleural effusions. Enhancing bibasilar atelectasis. Heart size is normal. Severe coronary artery calcifications and/or stents. No pericardial effusions. Status post median sternotomy. Fluid in the thoracic esophagus. HEPATOBILIARY: 15 mm nonenhancing low-density cyst LEFT lobe of the liver,  sub cm probable cyst RIGHT lobe of the liver. Mild biliary dilatation isolated to segment. Patent main portal vein. Heterogeneous contents and gas within the gallbladder fossa. Status post cholecystectomy. No extrahepatic biliary dilatation or cholelithiasis. PANCREAS: Normal. SPLEEN: Normal. ADRENALS/URINARY TRACT: Kidneys are orthotopic, demonstrating symmetric enhancement. No nephrolithiasis, hydronephrosis or solid renal masses. Multiple RIGHT benign appearing renal cysts measure up to 4.6 cm. Delayed imaging through the kidneys demonstrates symmetric prompt contrast excretion within the proximal urinary collecting system. Urinary bladder is well distended containing a Foley catheter in retaining bulb. Bladder fundal diverticulum with wall thickening and inflammation. Normal adrenal glands. STOMACH/BOWEL: The stomach, small and large bowel are normal in course and caliber without inflammatory changes. Mild sigmoid diverticulosis. Fluid-filled distended small bowel LEFT upper quadrant up to 4.4 cm. 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 No lymphadenopathy by CT size criteria. REPRODUCTIVE: Normal. OTHER: Small a moderate amount of intermediate density (20 Hounsfield units) free fluid. Focal small amount of increased density free fluid anterior to the liver with gas. MUSCULOSKELETAL: Nonacute. Small fat containing umbilical hernia. Small fat containing RIGHT umbilical hernia with subcutaneous gas. Moderate L5-S1 degenerative disc. Mild facet arthropathy. IMPRESSION: Status post cholecystectomy with gallbladder fossa hematoma, possible Surgicel. Mild intrahepatic biliary dilatation isolated cyst segment 5. Small to moderate amount of ascites, small amount of perihepatic hemo peritoneum and gas consistent with recent surgery. LEFT upper quadrant small bowel ileus. Urinary bladder wall thickening and inflammation concerning for cystitis,  predominately involving fundal diverticulum. Electronically Signed   By: Elon Alas M.D.   On: 05/27/2016 17:08   Dg Chest [facility] 1 View  Result  Date: 05/27/2016 CLINICAL DATA:  Leukocytosis EXAM: PORTABLE CHEST 1 VIEW COMPARISON:  05/21/2016 FINDINGS: There is no focal parenchymal opacity. There is no pleural effusion or pneumothorax. The heart and mediastinal contours are unremarkable. There is evidence of prior CABG. The osseous structures are unremarkable. IMPRESSION: No active disease. Electronically Signed   By: Kathreen Devoid   On: 05/27/2016 12:01    Anti-infectives: Anti-infectives    Start     Dose/Rate Route Frequency Ordered Stop   05/28/16 1930  vancomycin (VANCOCIN) 1,250 mg in sodium chloride 0.9 % 250 mL IVPB     1,250 mg 166.7 mL/hr over 90 Minutes Intravenous Every 24 hours 05/27/16 1806     05/27/16 1930  vancomycin (VANCOCIN) IVPB 1000 mg/200 mL premix     1,000 mg 200 mL/hr over 60 Minutes Intravenous  Once 05/27/16 1806 05/27/16 2111   05/27/16 1830  vancomycin (VANCOCIN) IVPB 1000 mg/200 mL premix     1,000 mg 200 mL/hr over 60 Minutes Intravenous  Once 05/27/16 1806 05/27/16 2001   05/27/16 1130  fluconazole (DIFLUCAN) IVPB 200 mg  Status:  Discontinued     200 mg 100 mL/hr over 60 Minutes Intravenous Every 24 hours 05/27/16 1115 05/27/16 1116   05/27/16 1130  fluconazole (DIFLUCAN) IVPB 400 mg     400 mg 100 mL/hr over 120 Minutes Intravenous Every 24 hours 05/27/16 1116     05/27/16 1100  piperacillin-tazobactam (ZOSYN) IVPB 3.375 g     3.375 g 12.5 mL/hr over 240 Minutes Intravenous Every 8 hours 05/27/16 1037     05/23/16 2000  cefTRIAXone (ROCEPHIN) 2 g in dextrose 5 % 50 mL IVPB  Status:  Discontinued     2 g 100 mL/hr over 30 Minutes Intravenous Every 24 hours 05/23/16 1904 05/27/16 0932   05/21/16 1800  vancomycin (VANCOCIN) IVPB 1000 mg/200 mL premix  Status:  Discontinued     1,000 mg 200 mL/hr over 60 Minutes Intravenous Every 24 hours 05/21/16  0753 05/22/16 1559   05/21/16 1000  piperacillin-tazobactam (ZOSYN) IVPB 3.375 g  Status:  Discontinued     3.375 g 12.5 mL/hr over 240 Minutes Intravenous Every 8 hours 05/21/16 0749 05/23/16 1904   05/21/16 0315  metroNIDAZOLE (FLAGYL) IVPB 500 mg     500 mg 100 mL/hr over 60 Minutes Intravenous  Once 05/21/16 0304 05/21/16 0450   05/21/16 0145  piperacillin-tazobactam (ZOSYN) IVPB 3.375 g     3.375 g 100 mL/hr over 30 Minutes Intravenous  Once 05/21/16 0135 05/21/16 0218   05/21/16 0145  vancomycin (VANCOCIN) IVPB 1000 mg/200 mL premix     1,000 mg 200 mL/hr over 60 Minutes Intravenous  Once 05/21/16 0135 05/21/16 0300      Assessment/Plan: s/p Procedure(s): LAPAROSCOPIC CHOLECYSTECTOMY Impression: Still with leukocytosis, though improved with the addition of Diflucan and vancomycin. To undergo CT-guided drainage of gallbladder fossa today. Discussed with interventional radiology. Continue current management.  LOS: 7 days    Jesus Maldonado A 05/28/2016

## 2016-05-28 NOTE — Progress Notes (Signed)
PROGRESS NOTE    Jesus Maldonado  [id number] DOB: [birth year] DOA: 05/21/2016 PCP: Kendrick Ranch, MD   Brief Narrative:  6 yom with a history of CAD s/p CABG x4. A-fib not on any anticoagulation, HTN, HLD, and parkinson disease, presented with complaints of abdominal pain. While in the ED, serology shows creatine 1.65, total bili 4.2, WBC 15.0, and hemoglobin 11.8. Abdominal CT is consistent with cholecystitis. General surgery and GI wereconsulted and he was admitted for further evaluation of acute cholecystitis. Blood cultures returned positive for E coli and he has been continued on rocephin. He underwent lap cholecystectomy in which he was found to have a gangrenous gallbladder. Post operatively, he has developed signficant leukocytosis up to 42K. Antibiotics were broadened to zosyn and vancomycin. Repeat CT indicated collection in the gallbladder fossa which may be hematoma vs. Biloma vs. Surgicel. Plan is for patient to undergo CT guided drain placement with interventional radiology on 11/21  Assessment & Plan:   Principal Problem:   Acute cholecystitis Active Problems:   HTN (hypertension)   Hyperlipidemia with target low density lipoprotein (LDL) cholesterol less than 70 mg/dL   CAD (coronary artery disease)   Atrial fibrillation (HCC)   Elevated LFTs   AKI (acute kidney injury) ([place])   E coli bacteremia   Parkinson's disease ([place])  1. Acute cholecystitis. Abdominal CT was consistent with cholecystitis. Patient underwent laparoscopic cholecystectomy on 11/17. Will continue with pain management with minimal narcotics per general surgery. 2. Escherichia coli sepsis. Blood cultures positive for Escherichia coli, likely from biliary source. Urine cultures show no growth. On antibiotics. 3. Leukocytosis. Patient had rise of WBC count to 42K after surgery.  CT abdomen was repeated and showed collection of fluid (hematoma/biloma) in gallbladder fossa. Antibiotics were  broadened to vancomycin and zosyn. Interventional radiology will place a drain on 11/21. 4. HTN.  Will continue to hold ramipril due to renal failure. Continue metoprolol. 5. AKI. Possibly secondary to episodes of hypotension he experienced 2 days ago. Patient does have evidence of LE edema and blood pressures have stabilized. Creatinine now stable..  6. Microcytic anemia. Post operatively, hemoglobin has slowly declined. Since it is down to 7.5, he will be transfused 1 unit PRBC today. Continue to monitor.  7. A-fib. Patient is not anticoagulated due to fall risk. Rate remainscontrolled. Continue beta-blocker.  8. CAD hx. S/p 4 CABGs. Aspirin currently on hold.  9. HLD. Continue statin. 10. Anasarca. Patient has been receiving IV fluids and is noted to be hypoalbuminemic likely leading to third spacing of IV fluids. Will start on low dose lasix. 11. Parkinson's Disease. Continue on sinimet 12. Dysphagia. Staff has reported episodes of choking when trying to eat. Seen by speech therapy and started on dysphagia 1 with thin liquids.  DVT prophylaxis: SCDs  Code Status: Full  Family Communication: Updated daughter over the phone Disposition Plan: Discharge home once improved.    Consultants:   General surgery  Cardiology  GI   Procedures: Echo  - Left ventricle: The cavity size was normal. Wall thickness was increased in a pattern of mild LVH. Systolic function was normal. The estimated ejection fraction was in the range of 60% to 65%. Wall motion was normal; there were no regional wall motion abnormalities. Doppler parameters are consistent with abnormal left ventricular relaxation (grade 1 diastolic dysfunction). - Aortic valve: Mildly calcified annulus. Trileaflet; mildly calcified leaflets. There was mild regurgitation. - Mitral valve: Calcified annulus. Mildly calcified leaflets . There was trivial regurgitation. - Right  atrium: Central venous pressure (est): 3  mm Hg. - Tricuspid valve: There was trivial regurgitation. - Pulmonary arteries: PA peak pressure: 25 mm Hg (S). - Pericardium, extracardiac: There was no pericardial effusion.  Antimicrobials:  Vancomycin 11/14 >>11/15, 11/20>>  Zosyn 11/14>>11/16, 11/20>>  Rocephin 11/16>>11/20   Subjective: No abdominal pain. No shortness of breath  Objective: Vitals:   05/27/16 2226 05/28/16 0000 05/28/16 0400 05/28/16 0724  BP: (!) 179/77     Pulse: 92   93  Resp:    14  Temp:  97 F (36.1 C) 97 F (36.1 C) 97.2 F (36.2 C)  TempSrc:  Oral Oral Oral  SpO2:    94%  Weight:      Height:        Intake/Output Summary (Last 24 hours) at 05/28/16 0754 Last data filed at 05/28/16 0640  Gross per 24 hour  Intake              230 ml  Output              355 ml  Net             -125 ml   Filed Weights   05/25/16 0400 05/26/16 0500 05/27/16 0500  Weight: 108 kg (238 lb 1.6 oz) 108.1 kg (238 lb 5.1 oz) 108.1 kg (238 lb 5.1 oz)    Examination:  General exam: Appears calm and comfortable  Respiratory system: Clear to auscultation. Respiratory effort normal. Cardiovascular system: S1 & S2 heard, RRR. No JVD, murmurs, rubs, gallops or clicks. 1+ pedal edema. Gastrointestinal system: Abdomen is nondistended, soft and nontender. No organomegaly or masses felt. Normal bowel sounds heard. Central nervous system: Alert and oriented. No focal neurological deficits. Extremities: Symmetric 5 x 5 power. Skin: No rashes, lesions or ulcers Psychiatry: Judgement and insight appear normal. Mood & affect appropriate.     Data Reviewed: I have personally reviewed following labs and imaging studies  CBC:  Recent Labs Lab 05/24/16 1734 05/25/16 0500 05/26/16 0443 05/27/16 0840 05/28/16 0420  WBC 31.9* 24.9* 32.3* 42.0* 32.1*  NEUTROABS 29.3*  --   --   --  28.3*  HGB 9.7* 9.1* 8.2* 8.6* 7.5*  HCT 31.5* 29.5* 26.2* 27.9* 24.8*  MCV 76.6* 76.2* 75.5* 77.1* 78.0  PLT 304 349 409* 536* 680*    Basic Metabolic Panel:  Recent Labs Lab 05/24/16 0612 05/25/16 0500 05/26/16 0443 05/27/16 0510 05/28/16 0420  NA 137 136 138 137 137  K 4.0 4.8 4.2 4.8 4.4  CL 104 107 106 107 105  CO2 25 19* 24 21* 27  GLUCOSE 91 129* 120* 100* 103*  BUN 19 32* 40* 43* 44*  CREATININE 1.16 1.98* 1.62* 1.24 1.37*  CALCIUM 8.2* 7.7* 8.0* 7.9* 7.8*  MG  --  1.9  --   --   --   PHOS  --  4.3  --   --   --    GFR: Estimated Creatinine Clearance: 50.3 mL/min (by C-G formula based on SCr of 1.37 mg/dL (H)). Liver Function Tests:  Recent Labs Lab 05/22/16 0617 05/23/16 0621 05/25/16 0500 05/26/16 0443 05/28/16 0420  AST 199* 112* 115* 61* 45*  ALT 54 76* 112* 40 32  ALKPHOS 326* 291* 221* 201* 201*  BILITOT 3.6* 1.7* 0.7 0.7 1.2  PROT 5.7* 5.6* 5.4* 5.7* 5.6*  ALBUMIN 2.2* 2.0* 2.1* 2.1* 2.1*   No results for input(s): LIPASE, AMYLASE in the last 168 hours. No results for input(s): AMMONIA in  the last 168 hours. Coagulation Profile:  Recent Labs Lab 05/28/16 0651  INR 1.15   Cardiac Enzymes: No results for input(s): CKTOTAL, CKMB, CKMBINDEX, TROPONINI in the last 168 hours. BNP (last 3 results) No results for input(s): PROBNP in the last 8760 hours. HbA1C: No results for input(s): HGBA1C in the last 72 hours. CBG: No results for input(s): GLUCAP in the last 168 hours. Lipid Profile: No results for input(s): CHOL, HDL, LDLCALC, TRIG, CHOLHDL, LDLDIRECT in the last 72 hours. Thyroid Function Tests: No results for input(s): TSH, T4TOTAL, FREET4, T3FREE, THYROIDAB in the last 72 hours. Anemia Panel: No results for input(s): VITAMINB12, FOLATE, FERRITIN, TIBC, IRON, RETICCTPCT in the last 72 hours. Sepsis Labs: No results for input(s): PROCALCITON, LATICACIDVEN in the last 168 hours.  Recent Results (from the past 240 hour(s))  Blood Culture (routine x 2)     Status: Abnormal   Collection Time: 05/21/16  1:38 AM  Result Value Ref Range Status   Specimen Description BLOOD  RIGHT HAND DRAWN BY RN  Final   Special Requests BOTTLES DRAWN AEROBIC AND ANAEROBIC 8CC EACH  Final   Culture  Setup Time   Final    GRAM NEGATIVE RODS RECOVERED FROM THE AEROBIC BOTTLE Gram Stain Report Called to,Read Back By and Verified With: COVINGTON,L. AT [id number] ON 05/21/2016 BY BAUGHAM,M. Performed at [facility] BY AND VERIFIED WITH: W BELIZAIRE,RN @0240  05/22/16 MKELLY.[email or web address] Performed at [facility] (A)  Final   Report Status 05/23/2016 FINAL  Final   Organism ID, Bacteria ESCHERICHIA COLI  Final      Susceptibility   Escherichia coli - MIC*    AMPICILLIN <=2 SENSITIVE Sensitive     CEFAZOLIN <=4 SENSITIVE Sensitive     CEFEPIME <=1 SENSITIVE Sensitive     CEFTAZIDIME <=1 SENSITIVE Sensitive     CEFTRIAXONE <=1 SENSITIVE Sensitive     CIPROFLOXACIN <=0.25 SENSITIVE Sensitive     GENTAMICIN <=1 SENSITIVE Sensitive     IMIPENEM <=0.25 SENSITIVE Sensitive     TRIMETH/SULFA <=20 SENSITIVE Sensitive     AMPICILLIN/SULBACTAM <=2 SENSITIVE Sensitive     PIP/TAZO <=4 SENSITIVE Sensitive     Extended ESBL NEGATIVE Sensitive     * ESCHERICHIA COLI  Blood Culture ID Panel (Reflexed)     Status: Abnormal   Collection Time: 05/21/16  1:38 AM  Result Value Ref Range Status   Enterococcus species NOT DETECTED NOT DETECTED Final   Listeria monocytogenes NOT DETECTED NOT DETECTED Final   Staphylococcus species NOT DETECTED NOT DETECTED Final   Staphylococcus aureus NOT DETECTED NOT DETECTED Final   Streptococcus species NOT DETECTED NOT DETECTED Final   Streptococcus agalactiae NOT DETECTED NOT DETECTED Final   Streptococcus pneumoniae NOT DETECTED NOT DETECTED Final   Streptococcus pyogenes NOT DETECTED NOT DETECTED Final   Acinetobacter baumannii NOT DETECTED NOT DETECTED Final   Enterobacteriaceae species DETECTED (A) NOT DETECTED Final    Comment: CRITICAL RESULT CALLED TO, READ BACK BY AND VERIFIED  WITH: W BELIZAIRE,RN @0240  05/22/16 MKELLY,MLT    Enterobacter cloacae complex NOT DETECTED NOT DETECTED Final   Escherichia coli DETECTED (A) NOT DETECTED Final    Comment: CRITICAL RESULT CALLED TO, READ BACK BY AND VERIFIED WITH: W BELIZAIRE,RN @0240  05/22/16 MKELLY,MLT    Klebsiella oxytoca NOT DETECTED NOT DETECTED Final   Klebsiella pneumoniae NOT DETECTED NOT DETECTED Final   Proteus species NOT DETECTED NOT DETECTED Final   Serratia  marcescens NOT DETECTED NOT DETECTED Final   Carbapenem resistance NOT DETECTED NOT DETECTED Final   Haemophilus influenzae NOT DETECTED NOT DETECTED Final   Neisseria meningitidis NOT DETECTED NOT DETECTED Final   Pseudomonas aeruginosa NOT DETECTED NOT DETECTED Final   Candida albicans NOT DETECTED NOT DETECTED Final   Candida glabrata NOT DETECTED NOT DETECTED Final   Candida krusei NOT DETECTED NOT DETECTED Final   Candida parapsilosis NOT DETECTED NOT DETECTED Final   Candida tropicalis NOT DETECTED NOT DETECTED Final    Comment: Performed at [facility]  Blood Culture (routine x 2)     Status: Abnormal   Collection Time: 05/21/16  1:48 AM  Result Value Ref Range Status   Specimen Description BLOOD LEFT ANTECUBITAL  Final   Special Requests   Final    BOTTLES DRAWN AEROBIC AND ANAEROBIC AEB=12CC ANA=10CC   Culture  Setup Time   Final    GRAM NEGATIVE RODS RECOVERED FROM THE AEROBIC BOTTLE Gram Stain Report Called to,Read Back By and Verified With: COVINGTON,L. AT [id number] ON 05/21/2016 BY BAUGHAM,M. Performed at [facility]    Culture (A)  Final    ESCHERICHIA COLI SUSCEPTIBILITIES PERFORMED ON PREVIOUS CULTURE WITHIN THE LAST 5 DAYS. Performed at [facility]    Report Status 05/23/2016 FINAL  Final  Urine culture     Status: None   Collection Time: 05/21/16  3:51 AM  Result Value Ref Range Status   Specimen Description URINE, CLEAN CATCH  Final   Special Requests NONE   Final   Culture NO GROWTH Performed at [facility]   Final   Report Status 05/22/2016 FINAL  Final  MRSA PCR Screening     Status: None   Collection Time: 05/24/16  8:32 PM  Result Value Ref Range Status   MRSA by PCR NEGATIVE NEGATIVE Final    Comment:        The GeneXpert MRSA Assay (FDA approved for NASAL specimens only), is one component of a comprehensive MRSA colonization surveillance program. It is not intended to diagnose MRSA infection nor to guide or monitor treatment for MRSA infections.   Culture, blood (routine x 2)     Status: None (Preliminary result)   Collection Time: 05/27/16 10:08 AM  Result Value Ref Range Status   Specimen Description BLOOD RIGHT THUMB  Final   Special Requests BOTTLES DRAWN AEROBIC ONLY 4CC  Final   Culture PENDING  Incomplete   Report Status PENDING  Incomplete  Culture, blood (routine x 2)     Status: None (Preliminary result)   Collection Time: 05/27/16 10:25 AM  Result Value Ref Range Status   Specimen Description BLOOD LEFT THUMB  Final   Special Requests BOTTLES DRAWN AEROBIC AND ANAEROBIC 4CC EACH  Final   Culture PENDING  Incomplete   Report Status PENDING  Incomplete  C difficile quick scan w PCR reflex     Status: None   Collection Time: 05/28/16  1:15 AM  Result Value Ref Range Status   C Diff antigen NEGATIVE NEGATIVE Final   C Diff toxin NEGATIVE NEGATIVE Final   C Diff interpretation No C. difficile detected.  Final         Radiology Studies: Ct Abdomen Pelvis W Contrast  Result Date: 05/27/2016 CLINICAL DATA:  Leukocytosis. Status post laparoscopic cholecystectomy November 17th for gangrenous cholecystitis, subsequent E coli sepsis. EXAM: CT ABDOMEN AND PELVIS WITH CONTRAST TECHNIQUE: Multidetector CT imaging of the  abdomen and pelvis was performed using the standard protocol following bolus administration of intravenous contrast. CONTRAST:  13mL ISOVUE-300 IOPAMIDOL (ISOVUE-300) INJECTION 61% COMPARISON:   None. FINDINGS: LOWER CHEST: Small bilateral pleural effusions. Enhancing bibasilar atelectasis. Heart size is normal. Severe coronary artery calcifications and/or stents. No pericardial effusions. Status post median sternotomy. Fluid in the thoracic esophagus. HEPATOBILIARY: 15 mm nonenhancing low-density cyst LEFT lobe of the liver, sub cm probable cyst RIGHT lobe of the liver. Mild biliary dilatation isolated to segment. Patent main portal vein. Heterogeneous contents and gas within the gallbladder fossa. Status post cholecystectomy. No extrahepatic biliary dilatation or cholelithiasis. PANCREAS: Normal. SPLEEN: Normal. ADRENALS/URINARY TRACT: Kidneys are orthotopic, demonstrating symmetric enhancement. No nephrolithiasis, hydronephrosis or solid renal masses. Multiple RIGHT benign appearing renal cysts measure up to 4.6 cm. Delayed imaging through the kidneys demonstrates symmetric prompt contrast excretion within the proximal urinary collecting system. Urinary bladder is well distended containing a Foley catheter in retaining bulb. Bladder fundal diverticulum with wall thickening and inflammation. Normal adrenal glands. STOMACH/BOWEL: The stomach, small and large bowel are normal in course and caliber without inflammatory changes. Mild sigmoid diverticulosis. Fluid-filled distended small bowel LEFT upper quadrant up to 4.4 cm. 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 No lymphadenopathy by CT size criteria. REPRODUCTIVE: Normal. OTHER: Small a moderate amount of intermediate density (20 Hounsfield units) free fluid. Focal small amount of increased density free fluid anterior to the liver with gas. MUSCULOSKELETAL: Nonacute. Small fat containing umbilical hernia. Small fat containing RIGHT umbilical hernia with subcutaneous gas. Moderate L5-S1 degenerative disc. Mild facet arthropathy. IMPRESSION: Status post cholecystectomy with  gallbladder fossa hematoma, possible Surgicel. Mild intrahepatic biliary dilatation isolated cyst segment 5. Small to moderate amount of ascites, small amount of perihepatic hemo peritoneum and gas consistent with recent surgery. LEFT upper quadrant small bowel ileus. Urinary bladder wall thickening and inflammation concerning for cystitis, predominately involving fundal diverticulum. Electronically Signed   By: Elon Alas M.D.   On: 05/27/2016 17:08   Dg Chest Port 1 View  Result Date: 05/27/2016 CLINICAL DATA:  Leukocytosis EXAM: PORTABLE CHEST 1 VIEW COMPARISON:  05/21/2016 FINDINGS: There is no focal parenchymal opacity. There is no pleural effusion or pneumothorax. The heart and mediastinal contours are unremarkable. There is evidence of prior CABG. The osseous structures are unremarkable. IMPRESSION: No active disease. Electronically Signed   By: Kathreen Devoid   On: 05/27/2016 12:01        Scheduled Meds: . atorvastatin  40 mg Oral QHS  . bisacodyl  10 mg Rectal BID  . carbidopa-levodopa  1 tablet Oral TID  . chlorhexidine  15 mL Mouth Rinse BID  . fluconazole (DIFLUCAN) IV  400 mg Intravenous Q24H  . magic mouthwash  15 mL Oral QID   And  . lidocaine  15 mL Mouth/Throat QID  . magnesium hydroxide  30 mL Oral Daily  . mouth rinse  15 mL Mouth Rinse q12n4p  . metoprolol  12.5 mg Oral BID  . pantoprazole  40 mg Oral Daily  . piperacillin-tazobactam (ZOSYN)  IV  3.375 g Intravenous Q8H  . sodium chloride flush  3 mL Intravenous Q12H  . vancomycin  1,250 mg Intravenous Q24H   Continuous Infusions:   LOS: 7 days    Time spent: 25 minutes    Kathie Dike, MD [facility] If 7PM-7AM, please contact night-coverage [email or web address] Password [facility] 05/28/2016, 7:54 AM

## 2016-05-28 NOTE — Sedation Documentation (Signed)
Waiting on [facility]- report called to [facility] RN. MD will call dtr for update

## 2016-05-28 NOTE — Sedation Documentation (Signed)
[facility] dispatched to transport back to [employer]

## 2016-05-28 NOTE — Progress Notes (Signed)
SLP Cancellation Note  Patient Details Name: Jesus Maldonado MRN: [id number] DOB: 04-Dec-1930   Cancelled treatment:       Reason Eval/Treat Not Completed: Patient at procedure or test/unavailable. ST to f/u as schedule permits later today or tomorrow. Thank you,  [name] H. Roddie Mc, CCC-SLP Speech Language Pathologist    Wende Bushy 05/28/2016, 2:46 PM

## 2016-05-28 NOTE — Sedation Documentation (Signed)
Pt has hx of AFib- going in and out with also SR beats

## 2016-05-28 NOTE — Procedures (Signed)
R GB fossa abscess drain 10 Fr No comp

## 2016-05-28 NOTE — Evaluation (Signed)
Physical Therapy Evaluation Patient Details Name: Jesus Maldonado MRN: [id number] DOB: 1931-03-31 Today's Date: 05/28/2016   History of Present Illness  82 y.o. male with hx of CAD, afib post CABGx4 not on anticoagulation, HTN, HLD, parkinson disease, presented to the ER with abdominal pain for several days.  He has no CP, SOB, nausea or vomiting.  Evaluation in the ER with abdominal CT revealed evidence of acute cholecystitis, cholelithiasis, but normal caliber CBD.  Incidentally, he was found to have a 1.4cm hypodense lesion on his liver (daughter was told).  His liver Fx tests showed AST 400's ALT 200's AlkPHO 400 total bili 4.  He has a leukocytosis with WBC of 15K.  His Cr was slightly elevated at 1.6.  EDP consulted Dr Arnoldo Morale of general surgery, who will see him in consultation, suggested GI consultation as well, and hospitalist was asked to admit him for acute cholecystitis, cholelithiaisis, r/out obstructive biliary disease.   He is s/p lap chole on 05/24/2016, Still with leukocytosis, though improved with the addition of Diflucan and vancomycin. To undergo CT-guided drainage of gallbladder fossa today.  Clinical Impression  Pt received in bed, and was agreeable to PT evaluation.  He states that he is independent with dressing and bathing, but he requires assistance for ambulation from his dtr, and he uses a RW.  However, per case management, he is completely independent with ambulation and ADL's prior to this admission.    During PT evaluation today, he required Max A for supine<>sit, and sit<>stand.  He was able to take a few lateral steps along the EOB, however was very fatigued with this.  He demonstrates minimal mobility in the bed at this time, and he is recommended for an OT consult to encourage ADL's.  At this point, he is recommended to d/c to SNF due to increased amount of assistance he is requiring to perform basic functional mobility tasks.      Follow Up Recommendations SNF     Equipment Recommendations  None recommended by PT    Recommendations for Other Services OT consult     Precautions / Restrictions Precautions Precautions: Other (comment) (log roll for supine<>sit to reduce abdominal discomfort. ) Restrictions Weight Bearing Restrictions: No      Mobility  Bed Mobility Overal bed mobility: Needs Assistance Bed Mobility: Rolling;Supine to Sit Rolling: Mod assist   Supine to sit: Max assist (increased time for motor planning.  Used log roll)        Transfers Overall transfer level: Needs assistance   Transfers: Sit to/from Stand Sit to Stand: Max assist;From elevated surface (Had pt rock back and forth to gain momentum to power up. )            Ambulation/Gait Ambulation/Gait assistance: Mod assist Ambulation Distance (Feet): 2 Feet         General Gait Details: Pt was able to take a few lateral steps up towards the [facility], however, limited due to fatigue and EMS being present to take him for gallbladder drain.   Stairs            Wheelchair Mobility    Modified Rankin (Stroke Patients Only)       Balance Overall balance assessment: Needs assistance Sitting-balance support: Bilateral upper extremity supported;Feet supported Sitting balance-Leahy Scale: Fair     Standing balance support: Bilateral upper extremity supported Standing balance-Leahy Scale: Poor Standing balance comment: initial posterior lean up ont standing.  Pertinent Vitals/Pain Pain Assessment: No/denies pain    Home Living   Living Arrangements: Children;Spouse/significant other (Pt states that his dtr and significant other live with him)   Type of Home: Mobile home Home Access: Stairs to enter   Entrance Stairs-Number of Steps: 2 steps with a railing Home Layout: One level Home Equipment: Walker - 2 wheels      Prior Function Level of Independence: Needs assistance   Gait / Transfers Assistance  Needed: Pt states that he uses the RW for ambulation, and his dtr assists him.   ADL's / Homemaking Assistance Needed: He states he is independent with dressing and bathing - unsure of how accurate this information is - no family currently present.         Hand Dominance   Dominant Hand: Right    Extremity/Trunk Assessment   Upper Extremity Assessment: Generalized weakness (B UE's noted to be edematous, and grossly a 2/5.  Decreased fine and gross motor bilaterally. )           Lower Extremity Assessment: Generalized weakness (grossly 3/5 with poor gross motor and fine motor)         Communication      Cognition Arousal/Alertness: Awake/alert Behavior During Therapy: WFL for tasks assessed/performed Overall Cognitive Status: Impaired/Different from baseline Area of Impairment: Orientation Orientation Level: Disoriented to;Time (States it is December)                  General Comments      Exercises General Exercises - Upper Extremity Shoulder Flexion: AAROM;Both;5 reps;Supine General Exercises - Lower Extremity Heel Slides: AAROM;Both;5 reps;Supine Other Exercises Other Exercises: supine lumbar rotation x 5 each direction to prepare for bed mobility - Max A.    Assessment/Plan    PT Assessment Patient needs continued PT services  PT Problem List Decreased strength;Decreased range of motion;Decreased activity tolerance;Decreased balance;Decreased mobility;Decreased coordination;Decreased knowledge of use of DME;Decreased safety awareness;Decreased knowledge of precautions;Cardiopulmonary status limiting activity          PT Treatment Interventions DME instruction;Gait training;Functional mobility training;Therapeutic activities;Therapeutic exercise;Balance training;Patient/family education    PT Goals (Current goals can be found in the Care Plan section)  Acute Rehab PT Goals PT Goal Formulation: Patient unable to participate in goal setting Time For  Goal Achievement: 06/11/16 Potential to Achieve Goals: Fair    Frequency Min 5X/week   Barriers to discharge        Co-evaluation               End of Session Equipment Utilized During Treatment: Gait belt;Oxygen Activity Tolerance: Patient limited by fatigue Patient left: in bed;with call bell/phone within reach      Functional Assessment Tool Used: [place] "6-clicks"  Functional Limitation: Mobility: Walking and moving around Mobility: Walking and Moving Around Current Status [phone]): At least 60 percent but less than 80 percent impaired, limited or restricted Mobility: Walking and Moving Around Goal Status [phone]): At least 40 percent but less than 60 percent impaired, limited or restricted    Time: [id number] PT Time Calculation (min) (ACUTE ONLY): 24 min   Charges:   PT Evaluation $PT Eval Moderate Complexity: 1 Procedure PT Treatments $Therapeutic Activity: 8-22 mins   PT G Codes:   PT G-Codes **NOT FOR INPATIENT CLASS** Functional Assessment Tool Used: [employer] "6-clicks"  Functional Limitation: Mobility: Walking and moving around Mobility: Walking and Moving Around Current Status [phone]): At least 60 percent but less than 80 percent impaired, limited or  restricted Mobility: Walking and Moving Around Goal Status [phone]): At least 40 percent but less than 60 percent impaired, limited or restricted    Beth Raihan Kimmel, PT, DPT X: [phone]

## 2016-05-28 NOTE — Consult Note (Signed)
Chief Complaint: gallbladder fossa fluid collection  Referring Physician:Dr. Kathie Dike  Supervising Physician: Marybelle Killings  Patient Status: [facility] inpt, sent to [facility] for this procedure  HPI: Jesus Maldonado is an 82 y.o. male who underwent a lap chole by Dr. Arnoldo Morale on 05-24-16 for gangrenous cholecystitis.  Surgicel and arista were placed in the gb bed for hemostasis at the end of the procedure.  The patient was also found to have E. Coli bacteremia.  Over the last several days the patient's WBC has been trending upward and are at 32K today.  A CT scan was obtained yesterday which revealed possible surgicel in the gb fossa vs hematoma.  A request has been made for possible drain placement in this site due to his elevated WBC.  Past Medical History:  Past Medical History:  Diagnosis Date  . Arthritis   . Coronary artery disease    Multivessel s/p remote PCI and CABG 2013  . Essential hypertension   . Hypercholesteremia   . Myocardial infarct   . Parkinson disease [facility])     Past Surgical History:  Past Surgical History:  Procedure Laterality Date  . CHOLECYSTECTOMY N/A 05/24/2016   Procedure: LAPAROSCOPIC CHOLECYSTECTOMY;  Surgeon: Aviva Signs, MD;  Location: [facility];  Service: General;  Laterality: N/A;  . CORONARY ARTERY BYPASS GRAFT  11/01/2011   Procedure: CORONARY ARTERY BYPASS GRAFTING (CABG);  Surgeon: Melrose Nakayama, MD;  Location: [place];  Service: Open Heart Surgery;  Laterality: N/A;  Coronary Artery bypass graft times four utilizing the left internal mammary artery and the right greater saphenous vein harvested endoscopically.  Marland Kitchen HEMORROIDECTOMY    . LEFT HEART CATHETERIZATION WITH CORONARY ANGIOGRAM N/A 10/28/2011   Procedure: LEFT HEART CATHETERIZATION WITH CORONARY ANGIOGRAM;  Surgeon: Burnell Blanks, MD;  Location[facility] C[facility] LAB;  Service: Cardiovascular;  Laterality: N/A;  . PERCUTANEOUS CORONARY INTERVENTION-BALLOON ONLY  10/28/2011   Procedure:  PERCUTANEOUS CORONARY INTERVENTION-BALLOON ONLY;  Surgeon: Burnell Blanks, MD;  Location: [facility] C[facility] LAB;  Service: Cardiovascular;;  . STERNAL INCISION RECLOSURE  11/06/2011   Procedure: STERNAL REWIRING;  Surgeon: Melrose Nakayama, MD;  Location: [place];  Service: Open Heart Surgery;  Laterality: N/A;    Family History:  Family History  Problem Relation Age of Onset  . Hypertension Father   . Colon cancer Neg Hx     Social History:  reports that he quit smoking about 44 years ago. His smoking use included Cigarettes. He has never used smokeless tobacco. He reports that he does not drink alcohol or use drugs.  Allergies: No Known Allergies  Medications: Medications reviewed in epic  Please HPI for pertinent positives, otherwise complete 10 system ROS negative.  Mallampati Score: MD Evaluation Airway: WNL Heart: WNL Abdomen: WNL Chest/ Lungs: WNL ASA  Classification: 3 Mallampati/Airway Score: Three  Physical Exam: BP (!) 125/54   Pulse 91   Temp 97 F (36.1 C) (Oral)   Resp (!) 22   Ht 5' 10"  (1.778 m)   Wt 238 lb 5.1 oz (108.1 kg)   SpO2 100%   BMI 34.20 kg/m  Body mass index is 34.2 kg/m. General: sedated, elderly white male who is laying in bed in NAD HEENT: head is normocephalic, atraumatic.  Sclera are noninjected.  PERRL.  Ears and nose without any masses or lesions.  Mouth is pink and dry Heart: regular, rate, and rhythm.  Normal s1,s2. No obvious murmurs, gallops, or rubs noted.  Palpable radial and pedal pulses  bilaterally Lungs: CTAB, no wheezes, rhonchi, or rales noted.  Respiratory effort nonlabored Abd: soft, NT, ND, +BS, no masses, hernias, or organomegaly, all incisions are healing well Psych: A&Ox3 when aroused, but otherwise very sedated from prior pain medications   Labs: Results for orders placed or performed during the hospital encounter of 05/21/16 (from the past 48 hour(s))  Basic metabolic panel     Status: Abnormal   Collection Time:  05/27/16  5:10 AM  Result Value Ref Range   Sodium 137 135 - 145 mmol/L   Potassium 4.8 3.5 - 5.1 mmol/L   Chloride 107 101 - 111 mmol/L   CO2 21 (L) 22 - 32 mmol/L   Glucose, Bld 100 (H) 65 - 99 mg/dL   BUN 43 (H) 6 - 20 mg/dL   Creatinine, Ser 1.24 0.61 - 1.24 mg/dL   Calcium 7.9 (L) 8.9 - 10.3 mg/dL   GFR calc non Af Amer 52 (L) >60 mL/min   GFR calc Af Amer >60 >60 mL/min    Comment: (NOTE) The eGFR has been calculated using the CKD EPI equation. This calculation has not been validated in all clinical situations. eGFR's persistently <60 mL/min signify possible Chronic Kidney Disease.    Anion gap 9 5 - 15  CBC     Status: Abnormal   Collection Time: 05/27/16  8:40 AM  Result Value Ref Range   WBC 42.0 (H) 4.0 - 10.5 K/uL   RBC 3.62 (L) 4.22 - 5.81 MIL/uL   Hemoglobin 8.6 (L) 13.0 - 17.0 g/dL   HCT 27.9 (L) 39.0 - 52.0 %   MCV 77.1 (L) 78.0 - 100.0 fL   MCH 23.8 (L) 26.0 - 34.0 pg   MCHC 30.8 30.0 - 36.0 g/dL   RDW 17.3 (H) 11.5 - 15.5 %   Platelets 536 (H) 150 - 400 K/uL  Culture, blood (routine x 2)     Status: None (Preliminary result)   Collection Time: 05/27/16 10:08 AM  Result Value Ref Range   Specimen Description BLOOD RIGHT THUMB    Special Requests BOTTLES DRAWN AEROBIC ONLY 4CC    Culture NO GROWTH < 24 HOURS    Report Status PENDING   Culture, blood (routine x 2)     Status: None (Preliminary result)   Collection Time: 05/27/16 10:25 AM  Result Value Ref Range   Specimen Description BLOOD LEFT THUMB    Special Requests BOTTLES DRAWN AEROBIC AND ANAEROBIC 4CC EACH    Culture NO GROWTH < 24 HOURS    Report Status PENDING   C difficile quick scan w PCR reflex     Status: None   Collection Time: 05/28/16  1:15 AM  Result Value Ref Range   C Diff antigen NEGATIVE NEGATIVE   C Diff toxin NEGATIVE NEGATIVE   C Diff interpretation No C. difficile detected.   Comprehensive metabolic panel     Status: Abnormal   Collection Time: 05/28/16  4:20 AM  Result  Value Ref Range   Sodium 137 135 - 145 mmol/L   Potassium 4.4 3.5 - 5.1 mmol/L   Chloride 105 101 - 111 mmol/L   CO2 27 22 - 32 mmol/L   Glucose, Bld 103 (H) 65 - 99 mg/dL   BUN 44 (H) 6 - 20 mg/dL   Creatinine, Ser 1.37 (H) 0.61 - 1.24 mg/dL   Calcium 7.8 (L) 8.9 - 10.3 mg/dL   Total Protein 5.6 (L) 6.5 - 8.1 g/dL   Albumin 2.1 (L) 3.5 -  5.0 g/dL   AST 45 (H) 15 - 41 U/L   ALT 32 17 - 63 U/L   Alkaline Phosphatase 201 (H) 38 - 126 U/L   Total Bilirubin 1.2 0.3 - 1.2 mg/dL   GFR calc non Af Amer 46 (L) >60 mL/min   GFR calc Af Amer 53 (L) >60 mL/min    Comment: (NOTE) The eGFR has been calculated using the CKD EPI equation. This calculation has not been validated in all clinical situations. eGFR's persistently <60 mL/min signify possible Chronic Kidney Disease.    Anion gap 5 5 - 15  CBC with Differential/Platelet     Status: Abnormal   Collection Time: 05/28/16  4:20 AM  Result Value Ref Range   WBC 32.1 (H) 4.0 - 10.5 K/uL    Comment: WHITE COUNT CONFIRMED ON SMEAR   RBC 3.18 (L) 4.22 - 5.81 MIL/uL   Hemoglobin 7.5 (L) 13.0 - 17.0 g/dL   HCT 24.8 (L) 39.0 - 52.0 %   MCV 78.0 78.0 - 100.0 fL   MCH 23.6 (L) 26.0 - 34.0 pg   MCHC 30.2 30.0 - 36.0 g/dL   RDW 17.4 (H) 11.5 - 15.5 %   Platelets 680 (H) 150 - 400 K/uL    Comment: SPECIMEN CHECKED FOR CLOTS PLATELET COUNT CONFIRMED BY SMEAR    Neutrophils Relative % 88 %   Lymphocytes Relative 6 %   Monocytes Relative 5 %   Eosinophils Relative 1 %   Basophils Relative 0 %   Neutro Abs 28.3 (H) 1.7 - 7.7 K/uL   Lymphs Abs 1.9 0.7 - 4.0 K/uL   Monocytes Absolute 1.6 (H) 0.1 - 1.0 K/uL   Eosinophils Absolute 0.3 0.0 - 0.7 K/uL   Basophils Absolute 0.0 0.0 - 0.1 K/uL   WBC Morphology MILD LEFT SHIFT (1-5% METAS, OCC MYELO, OCC BANDS)   Protime-INR     Status: None   Collection Time: 05/28/16  6:51 AM  Result Value Ref Range   Prothrombin Time 14.7 11.4 - 15.2 seconds   INR 1.15   APTT     Status: None   Collection  Time: 05/28/16  6:51 AM  Result Value Ref Range   aPTT 26 24 - 36 seconds  Prepare RBC     Status: None   Collection Time: 05/28/16 10:19 AM  Result Value Ref Range   Order Confirmation ORDER PROCESSED BY BLOOD BANK   Type and screen [facility]     Status: None (Preliminary result)   Collection Time: 05/28/16 10:19 AM  Result Value Ref Range   ABO/RH(D) A POS    Antibody Screen NEG    Sample Expiration 05/31/2016    Unit Number [id number]    Blood Component Type RED CELLS,LR    Unit division 00    Status of Unit ALLOCATED    Transfusion Status OK TO TRANSFUSE    Crossmatch Result Compatible     Imaging: Ct Abdomen Pelvis W Contrast  Result Date: 05/27/2016 CLINICAL DATA:  Leukocytosis. Status post laparoscopic cholecystectomy November 17th for gangrenous cholecystitis, subsequent E coli sepsis. EXAM: CT ABDOMEN AND PELVIS WITH CONTRAST TECHNIQUE: Multidetector CT imaging of the abdomen and pelvis was performed using the standard protocol following bolus administration of intravenous contrast. CONTRAST:  193m ISOVUE-300 IOPAMIDOL (ISOVUE-300) INJECTION 61% COMPARISON:  None. FINDINGS: LOWER CHEST: Small bilateral pleural effusions. Enhancing bibasilar atelectasis. Heart size is normal. Severe coronary artery calcifications and/or stents. No pericardial effusions. Status post median sternotomy. Fluid in the  thoracic esophagus. HEPATOBILIARY: 15 mm nonenhancing low-density cyst LEFT lobe of the liver, sub cm probable cyst RIGHT lobe of the liver. Mild biliary dilatation isolated to segment. Patent main portal vein. Heterogeneous contents and gas within the gallbladder fossa. Status post cholecystectomy. No extrahepatic biliary dilatation or cholelithiasis. PANCREAS: Normal. SPLEEN: Normal. ADRENALS/URINARY TRACT: Kidneys are orthotopic, demonstrating symmetric enhancement. No nephrolithiasis, hydronephrosis or solid renal masses. Multiple RIGHT benign appearing renal cysts measure  up to 4.6 cm. Delayed imaging through the kidneys demonstrates symmetric prompt contrast excretion within the proximal urinary collecting system. Urinary bladder is well distended containing a Foley catheter in retaining bulb. Bladder fundal diverticulum with wall thickening and inflammation. Normal adrenal glands. STOMACH/BOWEL: The stomach, small and large bowel are normal in course and caliber without inflammatory changes. Mild sigmoid diverticulosis. Fluid-filled distended small bowel LEFT upper quadrant up to 4.4 cm. 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 No lymphadenopathy by CT size criteria. REPRODUCTIVE: Normal. OTHER: Small a moderate amount of intermediate density (20 Hounsfield units) free fluid. Focal small amount of increased density free fluid anterior to the liver with gas. MUSCULOSKELETAL: Nonacute. Small fat containing umbilical hernia. Small fat containing RIGHT umbilical hernia with subcutaneous gas. Moderate L5-S1 degenerative disc. Mild facet arthropathy. IMPRESSION: Status post cholecystectomy with gallbladder fossa hematoma, possible Surgicel. Mild intrahepatic biliary dilatation isolated cyst segment 5. Small to moderate amount of ascites, small amount of perihepatic hemo peritoneum and gas consistent with recent surgery. LEFT upper quadrant small bowel ileus. Urinary bladder wall thickening and inflammation concerning for cystitis, predominately involving fundal diverticulum. Electronically Signed   By: Elon Alas M.D.   On: 05/27/2016 17:08   Dg Chest Port 1 View  Result Date: 05/27/2016 CLINICAL DATA:  Leukocytosis EXAM: PORTABLE CHEST 1 VIEW COMPARISON:  05/21/2016 FINDINGS: There is no focal parenchymal opacity. There is no pleural effusion or pneumothorax. The heart and mediastinal contours are unremarkable. There is evidence of prior CABG. The osseous structures are unremarkable. IMPRESSION: No  active disease. Electronically Signed   By: Kathreen Devoid   On: 05/27/2016 12:01    Assessment/Plan 1. Gallbladder fossa fluid collection -Dr. Barbie Banner has reviewed the imaging and spoken to Dr. Arnoldo Morale, the surgeon, who has requested a drain to be placed due to the patient's elevated WBC.  We will plan to proceed with that today.   -I have spoken to his daughter who has given verbal consent on the phone secondary to the patient's sedation. -labs and vitals have been reviewed. -Risks and Benefits discussed with the patient including bleeding, infection, damage to adjacent structures, bowel perforation/fistula connection, and sepsis. All of the patient's questions were answered, patient is agreeable to proceed. Consent signed and in chart.   Thank you for this interesting consult.  I greatly enjoyed meeting QUINTELL BONNIN and look forward to participating in their care.  A copy of this report was sent to the requesting provider on this date.  Electronically Signed: Henreitta Cea 05/28/2016, 2:04 PM   I spent a total of 40 Minutes    in face to face in clinical consultation, greater than 50% of which was counseling/coordinating care for gallbladder fossa fluid collection

## 2016-05-29 ENCOUNTER — Inpatient Hospital Stay (HOSPITAL_COMMUNITY): Payer: Medicare PPO

## 2016-05-29 LAB — TYPE AND SCREEN
ABO/RH(D): A POS
ANTIBODY SCREEN: NEGATIVE
Unit division: 0

## 2016-05-29 LAB — BASIC METABOLIC PANEL
Anion gap: 6 (ref 5–15)
BUN: 38 mg/dL — AB (ref 6–20)
CALCIUM: 7.5 mg/dL — AB (ref 8.9–10.3)
CO2: 27 mmol/L (ref 22–32)
CREATININE: 1.29 mg/dL — AB (ref 0.61–1.24)
Chloride: 106 mmol/L (ref 101–111)
GFR calc Af Amer: 57 mL/min — ABNORMAL LOW (ref >60)
GFR calc non Af Amer: 50 mL/min — ABNORMAL LOW (ref >60)
Glucose, Bld: 93 mg/dL (ref 65–99)
Potassium: 3.7 mmol/L (ref 3.5–5.1)
Sodium: 139 mmol/L (ref 135–145)

## 2016-05-29 LAB — CBC
HCT: 24.2 % — ABNORMAL LOW (ref 39.0–52.0)
HEMOGLOBIN: 7.3 g/dL — AB (ref 13.0–17.0)
MCH: 23.5 pg — AB (ref 26.0–34.0)
MCHC: 30.2 g/dL (ref 30.0–36.0)
MCV: 78.1 fL (ref 78.0–100.0)
PLATELETS: 567 10*3/uL — AB (ref 150–400)
RBC: 3.1 MIL/uL — ABNORMAL LOW (ref 4.22–5.81)
RDW: 18.2 % — AB (ref 11.5–15.5)
WBC: 19.6 10*3/uL — ABNORMAL HIGH (ref 4.0–10.5)

## 2016-05-29 MED ORDER — MAGNESIUM HYDROXIDE 400 MG/5ML PO SUSP: 30.0000 mL | Freq: Three times a day (TID) | ORAL | Status: DC | PRN

## 2016-05-29 NOTE — Clinical Social Work Note (Addendum)
Clinical Social Work Assessment  Patient Details  Name: Jesus Maldonado MRN: [id number] Date of Birth: 12-18-1930  Date of referral:  05/29/16               Reason for consult:  Discharge Planning                Permission sought to share information with:    Permission granted to share information::     Name::        Agency::     Relationship::     Contact Information:  Earlie Lou, daughter, listed on chart  Housing/Transportation Living arrangements for the past 2 months:  Single Family Home Source of Information:  Patient, Adult Children Patient Interpreter Needed:  None Criminal Activity/Legal Involvement Pertinent to Current Situation/Hospitalization:  No - Comment as needed Significant Relationships:  Adult Children Lives with:  Adult Children Do you feel safe going back to the place where you live?  Yes Need for family participation in patient care:  Yes (Comment)  Care giving concerns:  None identified.    Social Worker assessment / plan:  Patient lives with his daughter and her husband. He ambulates unassisted and completes ADLs unassisted.  He stated that he does not desire to go to SNF.  He stated that he would participate in [place]. Patient's daughter stated that patient is a "snow bird" and lives here with her until it get's cold then goes back to his other daughter in [place]. She stated that patient was scheduled to return to [place] on 11/28 but due to his current hospitalization that will wait until it is safe for him to fly. Ms. Bobbye [place] was agreeable to patient returning home at discharge with [place].   CSW signing off.   Employment status:  Retired Nurse, adult PT Recommendations:  [place] / Referral to community resources:  [place]  Patient/Family's Response to care:  Patient and family are agreeable for patient to return home with HHPT at discharge.   Patient/Family's Understanding of and  Emotional Response to Diagnosis, Current Treatment, and Prognosis:  Patient and daughter are aware of patient's diagnosis, treatment and prognosis.   Emotional Assessment Appearance:  Appears stated age Attitude/Demeanor/Rapport:   (Cooperative/Pleasant) Affect (typically observed):  Accepting Orientation:  Oriented to Self, Oriented to Place, Oriented to Situation, Oriented to  Time Alcohol / Substance use:  Not Applicable Psych involvement (Current and /or in the community):  No (Comment)  Discharge Needs  Concerns to be addressed:  Discharge Planning Concerns Readmission within the last 30 days:  No Current discharge risk:  None Barriers to Discharge:  No Barriers Identified   Ihor Gully, LCSW 05/29/2016, 11:41 AM

## 2016-05-29 NOTE — Progress Notes (Signed)
1105 Patient bathed and noted to have large amount of urine on bed linens. Patient had 57F indwelling Foley catheter with noted urine leaking around catheter. Catheter irrigated with noted sediment and urine return in line. 57F foley catheter removed and new 51F foley catheter inserted, immediate return of 1200cc straw yellow urine noted. Patient reported relief and lower ABD regional noted less distended.

## 2016-05-29 NOTE — Progress Notes (Signed)
PROGRESS NOTE  Jesus Maldonado [id number] DOB: 11-28-1930 DOA: 05/21/2016 PCP: Kendrick Ranch, MD  Brief Narrative: 82 year-old man presented with abdominal pain. Imaging consistent with cholecystitis. Developed bacteremia. Underwent upper scopic cholecystectomy with removal of gangrenous gallbladder 11/17. Thereafter developed marked leukocytosis and follow-up CT revealed a collection of fluid in the gallbladder fossa. Underwent drain placement. Cultures pending.   Assessment/Plan: 1. Acute cholecystitis, status post cholecystectomy with removal of gangrenous gallbladder 11/17. Subsequent imaging suggested collection of fluid in the gallbladder fossa, underwent drain placement. Cultures pending. 2. Escherichia coli bacteremia/sepsis, secondary to above. Repeat blood cultures no growth today. 3. Marked leukocytosis. Suspect leukemoid reaction. Responding to current treatment. No evidence of ongoing sepsis. 4. Acute kidney injury. Resolving. Secondary hypotension, sepsis. 5. Anemia of critical illness. Status post transfusion 1 unit packed red blood cells 11/21. Discussed with surgery. Hold off on transfusion today. 6. Atrial fibrillation. Currently in sinus rhythm. Not on any anticoagulation as an outpatient secondary to fall risk. 7. Bilateral upper extremity edema right greater than left. 8. Parkinson's disease. Noted 9. Dysphagia. Speech therapy consult appreciated. Continue dysphagia 1 diet, thin liquids.   Making slow improvement. Continue empiric antibiotics. Follow-up culture data. Recheck CBC in the morning.  Consider rehabilitation prior to return home.  Check venous duplex right upper extremity  Remove Foley catheter  Discussed with Dr. Arnoldo Morale.  DVT prophylaxis: SCDs Code Status: Full Family Communication: No Disposition Plan: Discharge once improved  Murray Hodgkins, MD  [facility] Direct contact: [phone] --Via [place]  --[email or web address]; password [email or web address]  7PM-7AM contact night coverage as above 05/29/2016, 12:44 PM  LOS: 8 days   Consultants:  General surgery  Cardiology  Gastroenterology  Procedures:  Transfused 1 unit PRBC 11/21  Echo  - Left ventricle: The cavity size was normal. Wall thickness was increased in a pattern of mild LVH. Systolic function was normal. The estimated ejection fraction was in the range of 60% to 65%. Wall motion was normal; there were no regional wall motion abnormalities. Doppler parameters are consistent with abnormal left ventricular relaxation (grade 1 diastolic dysfunction). - Aortic valve: Mildly calcified annulus. Trileaflet; mildly calcified leaflets. There was mild regurgitation. - Mitral valve: Calcified annulus. Mildly calcified leaflets . There was trivial regurgitation. - Right atrium: Central venous pressure (est): 3 mm Hg. - Tricuspid valve: There was trivial regurgitation. - Pulmonary arteries: PA peak pressure: 25 mm Hg (S). - Pericardium, extracardiac: There was no pericardial effusion.  Antimicrobials:  Vancomycin 11/14 >> 11/15, 11/20 >>  Zosyn 11/14 >> 11/16, 11/20 >>  Rocephin 11/16 >> 11/20  CC: Follow-up acute cholecystitis  Interval history/Subjective: Seems to feel well. No pain. Breathing well.  ROS:  No nausea or vomiting.   Objective: Vitals:   05/29/16 0900 05/29/16 1000 05/29/16 1100 05/29/16 1200  BP: (!) 115/59 (!) 134/59 (!) 157/86 109/64  Pulse: 94 88 88 94  Resp: (!) 25 20 (!) 21   Temp:      TempSrc:      SpO2: 93% 100% 97% 97%  Weight:      Height:        Intake/Output Summary (Last 24 hours) at 05/29/16 1244 Last data filed at 05/29/16 1150  Gross per 24 hour  Intake              905 ml  Output             3250 ml  Net            -  2345 ml     Filed Weights   05/26/16 0500 05/27/16 0500 05/29/16 0452  Weight: 108.1 kg (238 lb 5.1 oz) 108.1 kg (238 lb 5.1 oz) 118 kg (260 lb 2.3 oz)     Exam:    Constitutional:  . Appears calm and comfortable Eyes:  . PERRL and irises appear normal . Conjunctivae and lids appear normal ENMT:  . external ears, nose appear normal . grossly normal hearing  . Lips appear normal Respiratory:  . CTA bilaterally, no w/r/r.  . Respiratory effort normal. No retractions or accessory muscle use Cardiovascular:  . RRR, no m/r/g . 1+ bilateral LE extremity edema   2+ right upper extremity edema, 1+ left upper extremity edema Abdomen:  . soft, nontender.  Musculoskeletal:  o Moves all extremities to command Skin:   Grossly unremarkable Psychiatric:  . Mental status o Mood, affect appropriate   I have personally reviewed following labs and imaging studies:  BUN 38 and Creatinine 1.29, both improving  WBC 19.6, improving  Hgb 7.3, stable.  Scheduled Meds: . atorvastatin  40 mg Oral QHS  . carbidopa-levodopa  1 tablet Oral TID  . chlorhexidine  15 mL Mouth Rinse BID  . fluconazole (DIFLUCAN) IV  400 mg Intravenous Q24H  . furosemide  20 mg Intravenous Daily  . magic mouthwash  15 mL Oral QID   And  . lidocaine  15 mL Mouth/Throat QID  . mouth rinse  15 mL Mouth Rinse q12n4p  . metoprolol  12.5 mg Oral BID  . pantoprazole  40 mg Oral Daily  . piperacillin-tazobactam (ZOSYN)  IV  3.375 g Intravenous Q8H  . sodium chloride flush  3 mL Intravenous Q12H  . vancomycin  1,250 mg Intravenous Q24H   Continuous Infusions:  Principal Problem:   Acute cholecystitis Active Problems:   HTN (hypertension)   CAD (coronary artery disease)   Atrial fibrillation (HCC)   Elevated LFTs   AKI (acute kidney injury) ([place])   E coli bacteremia   Parkinson's disease ([place])   LOS: 8 days   Time spent 25 minutes  By signing my name below, I, Hilbert Odor, attest that this documentation has been prepared under the direction and in the presence of Max Romano P. Sarajane Jews, MD. Electronically signed: Hilbert Odor, Scribe.  05/29/16,  10:30 AM  I personally performed the services described in this documentation. All medical record entries made by the scribe were at my direction. I have reviewed the chart and agree that the record reflects my personal performance and is accurate and complete. Murray Hodgkins, MD

## 2016-05-29 NOTE — Progress Notes (Signed)
[facility] notified and order for PICC line placed.

## 2016-05-29 NOTE — Progress Notes (Signed)
Physical Therapy Treatment Patient Details Name: Jesus Maldonado MRN: [id number] DOB: 1930-09-21 Today's Date: 05/29/2016    History of Present Illness 82 y.o. male with hx of CAD, afib post CABGx4 not on anticoagulation, HTN, HLD, parkinson disease, presented to the ER with abdominal pain for several days.  He has no CP, SOB, nausea or vomiting.  Evaluation in the ER with abdominal CT revealed evidence of acute cholecystitis, cholelithiasis, but normal caliber CBD.  Incidentally, he was found to have a 1.4cm hypodense lesion on his liver (daughter was told).  His liver Fx tests showed AST 400's ALT 200's AlkPHO 400 total bili 4.  He has a leukocytosis with WBC of 15K.  His Cr was slightly elevated at 1.6.  EDP consulted Dr Arnoldo Morale of general surgery, who will see him in consultation, suggested GI consultation as well, and hospitalist was asked to admit him for acute cholecystitis, cholelithiaisis, r/out obstructive biliary disease.   He is s/p lap chole on 05/24/2016, Still with leukocytosis, though improved with the addition of Diflucan and vancomycin. S/P CT-guided drainage of gallbladder fossa 11/21 with JP drain.    PT Comments    Pt received in bed, son in law present, and pt is agreeable to PT tx.  Pt continues to require Max A for transfer supine<>sit, as well as sit<>stand.  He was able to ambulate 2x28ft with RW and Min A.  Pt demonstrates flexed fwd position, as well as shuffled gait.  Pt was encouraged to mobilize himself in the bed as much as possible.  Both pt and son in law express to PT that the pt will be going home.  PT expressed that if they choose to go home, he will need someone available who is able to provide significant physical assistance, HHPT, and a hospital bed.  Son in law states that he would be the one providing the physical assistance, and they have a w/c they can borrow, as well as a BSC, and shower chair.  Pt is recommended for an OT consult for ADL training.  If pt goes  home, he will need a hospital bed.   Follow Up Recommendations  SNF;Home health PT;Supervision/Assistance - 24 hour (However, pt and family have expressed desire to go home.  )     [place] [facility] bed    Recommendations for Other Services OT consult     Precautions / Restrictions Precautions Precautions: Fall Precaution Comments: Due to immobility Restrictions Weight Bearing Restrictions: No    Mobility  Bed Mobility Overal bed mobility: Needs Assistance Bed Mobility: Rolling Rolling: Min assist (vc's for hand placement)   Supine to sit: Max assist;HOB elevated (Assist to raise trunk up, and advance LE's off the EOB.  )        Transfers Overall transfer level: Needs assistance Equipment used: Rolling walker (2 wheeled) Transfers: Sit to/from Stand Sit to Stand: Mod assist;Max assist (Max A for sit<>stand from the bed, and Mod A for sit<>stand from the recliner.  )         General transfer comment: Assistance and increased time required for pt to get hands placed on the RW after pushing up.  Just after PT had pt settled in the chair, [place] arrived to do an [place] of his R UE, and pt was going to have a PICC line placed, therefore, pt needed to be assisted back into the bed.   Ambulation/Gait Ambulation/Gait assistance: Min assist Ambulation Distance (Feet): 5 Feet (x2) Assistive device: Rolling walker (  2 wheeled) Gait Pattern/deviations: Shuffle;Trunk flexed   Gait velocity interpretation: <1.8 ft/sec, indicative of risk for recurrent falls General Gait Details: Pt requires multiple vc's for upright standing posture, extremely poor clearance of B feet.     Stairs            Wheelchair Mobility    Modified Rankin (Stroke Patients Only)       Balance Overall balance assessment: Needs assistance Sitting-balance support: Bilateral upper extremity supported;Feet supported Sitting balance-Leahy Scale: Fair     Standing balance support:  Bilateral upper extremity supported Standing balance-Leahy Scale: Poor Standing balance comment: initial posterior lean                    Cognition Arousal/Alertness: Awake/alert Behavior During Therapy: WFL for tasks assessed/performed Overall Cognitive Status: Within Functional Limits for tasks assessed                      Exercises General Exercises - Lower Extremity Heel Slides: AROM;Both;10 reps;Supine Other Exercises Other Exercises: supine lumbar rotation x 5 each direction to prepare for bed mobility - Mod A.     General Comments        Pertinent Vitals/Pain Pain Assessment: No/denies pain    Home Living                      Prior Function            PT Goals (current goals can now be found in the care plan section) Acute Rehab PT Goals Patient Stated Goal: To go home Time For Goal Achievement: 06/11/16 Potential to Achieve Goals: Fair Progress towards PT goals: Progressing toward goals    Frequency    Min 5X/week      PT Plan Current plan remains appropriate    Co-evaluation             End of Session Equipment Utilized During Treatment: Gait belt Activity Tolerance: Patient tolerated treatment well Patient left: in bed;with call bell/phone within reach;with nursing/sitter in room;with family/visitor present     Time: 1300-1350 PT Time Calculation (min) (ACUTE ONLY): 50 min  Charges:  $Therapeutic Activity: 23-37 mins $Self Care/Home Management: 8-22                    G Codes:      Beth Zadok Holaway, PT, DPT X: [phone]

## 2016-05-29 NOTE — Progress Notes (Signed)
5 Days Post-Op  Subjective: Patient denies any pain. Denies any nausea.  Objective: Vital signs in last 24 hours: Temp:  [96.9 F (36.1 C)-98.2 F (36.8 C)] 98 F (36.7 C) (11/22 0400) Pulse Rate:  [71-95] 89 (11/22 0815) Resp:  [14-81] 20 (11/22 0815) BP: (99-141)/(51-80) 119/57 (11/22 0800) SpO2:  [96 %-100 %] 98 % (11/22 0815) Weight:  [118 kg (260 lb 2.3 oz)] 118 kg (260 lb 2.3 oz) (11/22 0452) Last BM Date: 05/28/16  Intake/Output from previous day: 11/21 0701 - 11/22 0700 In: 908 [P.O.:240; I.V.:3; Blood:365; IV Piggyback:300] Out: 2400 [Urine:2375; Drains:25] Intake/Output this shift: No intake/output data recorded.  General appearance: cooperative and no distress GI: Soft, incisions healing well. Serosanguineous fluid in drainage tubing.  Lab Results:   Recent Labs  05/28/16 0420 05/29/16 0422  WBC 32.1* 19.6*  HGB 7.5* 7.3*  HCT 24.8* 24.2*  PLT 680* 567*   BMET  Recent Labs  05/28/16 0420 05/29/16 0422  NA 137 139  K 4.4 3.7  CL 105 106  CO2 27 27  GLUCOSE 103* 93  BUN 44* 38*  CREATININE 1.37* 1.29*  CALCIUM 7.8* 7.5*   PT/INR  Recent Labs  05/28/16 0651  LABPROT 14.7  INR 1.15    Studies/Results: Ct Abdomen Pelvis W Contrast  Result Date: 05/27/2016 CLINICAL DATA:  Leukocytosis. Status post laparoscopic cholecystectomy November 17th for gangrenous cholecystitis, subsequent E coli sepsis. EXAM: CT ABDOMEN AND PELVIS WITH CONTRAST TECHNIQUE: Multidetector CT imaging of the abdomen and pelvis was performed using the standard protocol following bolus administration of intravenous contrast. CONTRAST:  160mL ISOVUE-300 IOPAMIDOL (ISOVUE-300) INJECTION 61% COMPARISON:  None. FINDINGS: LOWER CHEST: Small bilateral pleural effusions. Enhancing bibasilar atelectasis. Heart size is normal. Severe coronary artery calcifications and/or stents. No pericardial effusions. Status post median sternotomy. Fluid in the thoracic esophagus. HEPATOBILIARY: 15 mm  nonenhancing low-density cyst LEFT lobe of the liver, sub cm probable cyst RIGHT lobe of the liver. Mild biliary dilatation isolated to segment. Patent main portal vein. Heterogeneous contents and gas within the gallbladder fossa. Status post cholecystectomy. No extrahepatic biliary dilatation or cholelithiasis. PANCREAS: Normal. SPLEEN: Normal. ADRENALS/URINARY TRACT: Kidneys are orthotopic, demonstrating symmetric enhancement. No nephrolithiasis, hydronephrosis or solid renal masses. Multiple RIGHT benign appearing renal cysts measure up to 4.6 cm. Delayed imaging through the kidneys demonstrates symmetric prompt contrast excretion within the proximal urinary collecting system. Urinary bladder is well distended containing a Foley catheter in retaining bulb. Bladder fundal diverticulum with wall thickening and inflammation. Normal adrenal glands. STOMACH/BOWEL: The stomach, small and large bowel are normal in course and caliber without inflammatory changes. Mild sigmoid diverticulosis. Fluid-filled distended small bowel LEFT upper quadrant up to 4.4 cm. 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 No lymphadenopathy by CT size criteria. REPRODUCTIVE: Normal. OTHER: Small a moderate amount of intermediate density (20 Hounsfield units) free fluid. Focal small amount of increased density free fluid anterior to the liver with gas. MUSCULOSKELETAL: Nonacute. Small fat containing umbilical hernia. Small fat containing RIGHT umbilical hernia with subcutaneous gas. Moderate L5-S1 degenerative disc. Mild facet arthropathy. IMPRESSION: Status post cholecystectomy with gallbladder fossa hematoma, possible Surgicel. Mild intrahepatic biliary dilatation isolated cyst segment 5. Small to moderate amount of ascites, small amount of perihepatic hemo peritoneum and gas consistent with recent surgery. LEFT upper quadrant small bowel ileus. Urinary bladder wall  thickening and inflammation concerning for cystitis, predominately involving fundal diverticulum. Electronically Signed   By: Elon Alas M.D.   On:  05/27/2016 17:08   Dg Chest Port 1 View  Result Date: 05/27/2016 CLINICAL DATA:  Leukocytosis EXAM: PORTABLE CHEST 1 VIEW COMPARISON:  05/21/2016 FINDINGS: There is no focal parenchymal opacity. There is no pleural effusion or pneumothorax. The heart and mediastinal contours are unremarkable. There is evidence of prior CABG. The osseous structures are unremarkable. IMPRESSION: No active disease. Electronically Signed   By: Kathreen Devoid   On: 05/27/2016 12:01   Ct Image Guided Fluid Drain By Catheter  Result Date: 05/28/2016 INDICATION: Gallbladder fossa abscess EXAM: CT IMAGE GUIDED FLUID DRAIN BY CATHETER MEDICATIONS: The patient is currently admitted to the hospital and receiving intravenous antibiotics. The antibiotics were administered within an appropriate time frame prior to the initiation of the procedure. ANESTHESIA/SEDATION: None. COMPLICATIONS: None immediate. PROCEDURE: Informed written consent was obtained from the patient after a thorough discussion of the procedural risks, benefits and alternatives. All questions were addressed. Maximal Sterile Barrier Technique was utilized including caps, mask, sterile gowns, sterile gloves, sterile drape, hand hygiene and skin antiseptic. A timeout was performed prior to the initiation of the procedure. In the supine position, the right flank was prepped and draped. 1% lidocaine was utilized for local anesthesia. Under CT guidance, an 18 gauge needle was inserted into the gallbladder fossa fluid collection via transhepatic approach. It was removed over an Amplatz wire. A 10 French dilator followed by a 10 [place] drain were inserted over the wire and coiled in the gallbladder fossa fluid collection. Dark reddish fluid was aspirated. It was looped and string fixed then sewn to the skin. FINDINGS: Images  document placement of a 10 French drain in a gallbladder fossa fluid collection. IMPRESSION: Successful gallbladder fossa fluid collection 10 French drain placement. Electronically Signed   By: Marybelle Killings M.D.   On: 05/28/2016 16:12    Anti-infectives: Anti-infectives    Start     Dose/Rate Route Frequency Ordered Stop   05/28/16 1930  vancomycin (VANCOCIN) 1,250 mg in sodium chloride 0.9 % 250 mL IVPB     1,250 mg 166.7 mL/hr over 90 Minutes Intravenous Every 24 hours 05/27/16 1806     05/27/16 1930  vancomycin (VANCOCIN) IVPB 1000 mg/200 mL premix     1,000 mg 200 mL/hr over 60 Minutes Intravenous  Once 05/27/16 1806 05/27/16 2111   05/27/16 1830  vancomycin (VANCOCIN) IVPB 1000 mg/200 mL premix     1,000 mg 200 mL/hr over 60 Minutes Intravenous  Once 05/27/16 1806 05/27/16 2001   05/27/16 1130  fluconazole (DIFLUCAN) IVPB 200 mg  Status:  Discontinued     200 mg 100 mL/hr over 60 Minutes Intravenous Every 24 hours 05/27/16 1115 05/27/16 1116   05/27/16 1130  fluconazole (DIFLUCAN) IVPB 400 mg     400 mg 100 mL/hr over 120 Minutes Intravenous Every 24 hours 05/27/16 1116     05/27/16 1100  piperacillin-tazobactam (ZOSYN) IVPB 3.375 g     3.375 g 12.5 mL/hr over 240 Minutes Intravenous Every 8 hours 05/27/16 1037     05/23/16 2000  cefTRIAXone (ROCEPHIN) 2 g in dextrose 5 % 50 mL IVPB  Status:  Discontinued     2 g 100 mL/hr over 30 Minutes Intravenous Every 24 hours 05/23/16 1904 05/27/16 0932   05/21/16 1800  vancomycin (VANCOCIN) IVPB 1000 mg/200 mL premix  Status:  Discontinued     1,000 mg 200 mL/hr over 60 Minutes Intravenous Every 24 hours 05/21/16 0753 05/22/16 1559   05/21/16 1000  piperacillin-tazobactam (ZOSYN) IVPB 3.375 g  Status:  Discontinued     3.375 g 12.5 mL/hr over 240 Minutes Intravenous Every 8 hours 05/21/16 0749 05/23/16 1904   05/21/16 0315  metroNIDAZOLE (FLAGYL) IVPB 500 mg     500 mg 100 mL/hr over 60 Minutes Intravenous  Once 05/21/16 0304 05/21/16  0450   05/21/16 0145  piperacillin-tazobactam (ZOSYN) IVPB 3.375 g     3.375 g 100 mL/hr over 30 Minutes Intravenous  Once 05/21/16 0135 05/21/16 0218   05/21/16 0145  vancomycin (VANCOCIN) IVPB 1000 mg/200 mL premix     1,000 mg 200 mL/hr over 60 Minutes Intravenous  Once 05/21/16 0135 05/21/16 0300      Assessment/Plan: s/p Procedure(s): LAPAROSCOPIC CHOLECYSTECTOMY Impression: Leukocytosis resolving, status post percutaneous drainage of subhepatic space, with the addition of vancomycin and Diflucan. No biloma present. Hemoglobin still low but stable. May need PICC line due to potentially difficult IV access. Discussed with Dr. Sarajane Jews.  LOS: 8 days    Jesus Maldonado A 05/29/2016

## 2016-05-29 NOTE — Progress Notes (Signed)
Spoke with lab regarding PRBC units as ordered for patient, order noted d/c. MD notified for clarification.

## 2016-05-29 NOTE — Care Management Note (Signed)
Case Management Note  Patient Details  Name: Jesus Maldonado MRN: [id number] Date of Birth: 1931-05-16  Expected Discharge Date:     05/31/2016             Expected Discharge Plan:  Home/Self Care  In-House Referral:  NA  Discharge planning Services  CM Consult  Post Acute Care Choice:  NA Choice offered to:  NA  Status of Service:  In process, will continue to follow  Additional Comments: CM spoke with pt who is agreeable to [facility] and has no preference to [facility] agency. Pt's daughter has chosen [facility] from list of [facility] providers. She is aware that [facility] has 48hrs to make first visit. RN will notify [facility] when pt discharges. Cristal Ford, of [facility] is aware of new referral and will obtain pt info from chart. No DME needed.  Sherald Barge, RN 05/29/2016, 3:11 PM

## 2016-05-29 NOTE — Care Management Important Message (Signed)
Important Message  Patient Details  Name: COURAGE RUBY MRN: [id number] Date of Birth: 1931-06-05   Medicare Important Message Given:  Yes    Sherald Barge, RN 05/29/2016, 3:18 PM

## 2016-05-30 LAB — GLUCOSE, CAPILLARY: Glucose-Capillary: 102 mg/dL — ABNORMAL HIGH (ref 65–99)

## 2016-05-30 LAB — CBC
HEMATOCRIT: 24.1 % — AB (ref 39.0–52.0)
Hemoglobin: 7.6 g/dL — ABNORMAL LOW (ref 13.0–17.0)
MCH: 24.4 pg — ABNORMAL LOW (ref 26.0–34.0)
MCHC: 31.5 g/dL (ref 30.0–36.0)
MCV: 77.5 fL — AB (ref 78.0–100.0)
Platelets: 543 10*3/uL — ABNORMAL HIGH (ref 150–400)
RBC: 3.11 MIL/uL — ABNORMAL LOW (ref 4.22–5.81)
RDW: 18.6 % — AB (ref 11.5–15.5)
WBC: 23.8 10*3/uL — ABNORMAL HIGH (ref 4.0–10.5)

## 2016-05-30 LAB — VANCOMYCIN, TROUGH: VANCOMYCIN TR: 10 ug/mL — AB (ref 15–20)

## 2016-05-30 MED ORDER — VANCOMYCIN HCL IN DEXTROSE 750-5 MG/150ML-% IV SOLN
750.0000 mg | Freq: Two times a day (BID) | INTRAVENOUS | Status: DC
  Administered 2016-05-31: 750 mg via INTRAVENOUS
  Filled 2016-05-30 (×8): qty 150

## 2016-05-30 NOTE — Progress Notes (Signed)
Speech Language Pathology Treatment: Dysphagia  Patient Details Name: Jesus Maldonado MRN: [id number] DOB: 05/02/1931 Today's Date: 05/30/2016 Time: [id number] SLP Time Calculation (min) (ACUTE ONLY): 25 min  Assessment / Plan / Recommendation Clinical Impression  Pt seen at bedside in ICU for diet tolerance, trial upgrade, and pt education. His tongue appearance is much improved since previous visit and he only has mild patchiness on right posterior dorsum. SLP provided oral care and toothbrush to area to sluff off. Pt responded more rapidly to SLP questions this date and followed commands with only min delay (previously very delayed with all responses). Pt requested his dentures and these were placed after cleaning, however they did not stay in well. No denture cream found in room, so telephone call placed to daughter, Bethena Roys. SLP left voicemail asking her to bring in his denture cream (dentures removed). Pt willing to try graham crackers without dentures and he benefited from alternating solids/liquids to accommodate for dry cracker. Will upgrade to D3/mech soft and thin liquids. Pt agreeable to plan of care. Pt may benefit from follow up outpatient or home health SLP for follow up dysphagia and dysarthria therapy for Parkinson's. Above discussed with pt/family (they arrived while I was finishing this note).   HPI HPI: 19 yom with a history of CAD s/p CABG x4. A-fib not on any anticoagulation, HTN, HLD, and parkinson disease, presented with complaints of abdominal pain. While in the ED, serology shows creatine 1.65, total bili 4.2, WBC 15.0, and hemoglobin 11.8. Abdominal CT is consistent with cholecystitis. General surgery and GI were consulted and he was admitted for further evaluation of acute cholecystitis. Blood cultures returned positive for E coli and he has been continued on rocephin. He underwent lap cholecystectomy in which he was found to have a gangrenous gallbladder.      SLP Plan  Continue  with current plan of care     Recommendations  Diet recommendations: Thin liquid;Dysphagia 3 (mechanical soft) Liquids provided via: Cup;Straw Medication Administration: Whole meds with puree (Pt expressed this method 05/30/16 to SLP) Supervision: Patient able to self feed;Intermittent supervision to cue for compensatory strategies Compensations: Slow rate;Follow solids with liquid Postural Changes and/or Swallow Maneuvers: Seated upright 90 degrees;Upright 30-60 min after meal (ideally out of bed for po)                Oral Care Recommendations: Oral care BID;Staff/trained caregiver to provide oral care Follow up Recommendations: Outpatient SLP;Home health SLP Plan: Continue with current plan of care       Thank you,  Genene Churn, [place]                 Jesus Maldonado 05/30/2016, 9:37 AM

## 2016-05-30 NOTE — Progress Notes (Addendum)
Pharmacy Antibiotic Note  Jesus Maldonado is a 82 y.o. male admitted on 05/21/2016 with intra-abdominal infection.  Pharmacy has been consulted for Zosyn and Vancomycin dosing. WBC increasing, broadening coverage. CT of abdomen today. Vancomycin trough reported as 69mcg/ml, goal is 58mcg/ml. Adjust dosing.   Plan: Vancomycin .1250mg  now, then give 750mg  IV q12h (Goal Trough = 15-69mcg/ml) Continue Zosyn 3.375g IV q8h (4 hour infusion). F/U cultures and clinical progress Monitor labs  Height: 5\' 10"  (177.8 cm) Weight: 235 lb 0.2 oz (106.6 kg) IBW/kg (Calculated) : 73  Temp (24hrs), Avg:98.3 F (36.8 C), Min:98.1 F (36.7 C), Max:98.6 F (37 C)   Recent Labs Lab 05/25/16 0500 05/26/16 0443 05/27/16 0510 05/27/16 0840 05/28/16 0420 05/29/16 0422 05/30/16 0455 05/30/16 1933  WBC 24.9* 32.3*  --  42.0* 32.1* 19.6* 23.8*  --   CREATININE 1.98* 1.62* 1.24  --  1.37* 1.29*  --   --   VANCOTROUGH  --   --   --   --   --   --   --  10*    Estimated Creatinine Clearance: 53 mL/min (by C-G formula based on SCr of 1.29 mg/dL (H)).    No Known Allergies  Antimicrobials this admission: Vancomycin 11/14 >> 11/15 now restart 11/20>> Zosyn 11/14 >> 11/16 Ceftriaxone 11/16>>11/20 Fluconazole 11/20>>  Dose adjustments this admission: 11/23 Vancomycin 750mg  IV q12h  Microbiology results: 11/14 BCx: E.Col x 2 bottles which are pans sensitive 11/20 Blood cx: ngtd 1/21 gallbladder fluid: ngtd 11/14 UCx: no growth 11/17 MRSA: (-)  Thank you for allowing pharmacy to be a part of this patient's care. Isac Sarna, BS [place], [place] Clinical Pharmacist Pager [phone] 05/30/2016 8:12 PM

## 2016-05-30 NOTE — Progress Notes (Signed)
[facility] Hospital Day(s): 9.   Post op day(s): 6 Days Post-Op.   Interval History: Patient seen and examined, no acute events or new complaints overnight. Patient reports tolerating his diet, denies pain, N/V, fever/chills, CP, or SOB.  Review of Systems:  Constitutional: denies fever, chills  HEENT: denies cough or congestion  Respiratory: denies any shortness of breath  Cardiovascular: denies chest pain or palpitations  Gastrointestinal: denies abdominal pain, N/V, or diarrhea Genitourinary: denies burning with urination or urinary frequency Musculoskeletal: denies pain, decreased motor or sensation Integumentary: denies any other rashes or skin discolorations Neurological: denies HA or vision/hearing changes   Vital signs in last 24 hours: [min-max] current  Temp:  [97.9 F (36.6 C)-98.6 F (37 C)] 98.1 F (36.7 C) (11/23 0804) Pulse Rate:  [73-101] 93 (11/23 0900) Resp:  [15-25] 25 (11/23 0900) BP: (99-174)/(48-86) 123/54 (11/23 0900) SpO2:  [84 %-100 %] 100 % (11/23 0900) Weight:  [106.6 kg (235 lb 0.2 oz)] 106.6 kg (235 lb 0.2 oz) (11/23 0500)     Height: 5\' 10"  (177.8 cm) Weight: 106.6 kg (235 lb 0.2 oz) BMI (Calculated): 33.9   Intake/Output this shift:  Total I/O In: 50 [IV Piggyback:50] Out: -    Intake/Output last 2 shifts:  @IOLAST2SHIFTS @   Physical Exam:  Constitutional: alert, cooperative and no distress  HENT: normocephalic without obvious abnormality  Eyes: PERRL, EOM's grossly intact and symmetric  Neuro: CN II - XII grossly intact and symmetric without deficit  Respiratory: breathing non-labored at rest  Cardiovascular: regular rate and sinus rhythm  Gastrointestinal: soft, obese, non-tender, incisions well-approximated without erythema or drainage, RUQ drain with small amount (< 20 mL) of thin dark burgundy fluid in the drain, no green coloration of fluid Musculoskeletal: UE and LE FROM, 2+ B/L upper extremity edema, motor and  sensation grossly intact, NT  Labs:  CBC Latest Ref Rng & Units 05/30/2016 05/29/2016 05/28/2016  WBC 4.0 - 10.5 K/uL 23.8(H) 19.6(H) 32.1(H)  Hemoglobin 13.0 - 17.0 g/dL 7.6(L) 7.3(L) 7.5(L)  Hematocrit 39.0 - 52.0 % 24.1(L) 24.2(L) 24.8(L)  Platelets 150 - 400 K/uL 543(H) 567(H) 680(H)   CMP Latest Ref Rng & Units 05/29/2016 05/28/2016 05/27/2016  Glucose 65 - 99 mg/dL 93 103(H) 100(H)  BUN 6 - 20 mg/dL 38(H) 44(H) 43(H)  Creatinine 0.61 - 1.24 mg/dL 1.29(H) 1.37(H) 1.24  Sodium 135 - 145 mmol/L 139 137 137  Potassium 3.5 - 5.1 mmol/L 3.7 4.4 4.8  Chloride 101 - 111 mmol/L 106 105 107  CO2 22 - 32 mmol/L 27 27 21(L)  Calcium 8.9 - 10.3 mg/dL 7.5(L) 7.8(L) 7.9(L)  Total Protein 6.5 - 8.1 g/dL - 5.6(L) -  Total Bilirubin 0.3 - 1.2 mg/dL - 1.2 -  Alkaline Phos 38 - 126 U/L - 201(H) -  AST 15 - 41 U/L - 45(H) -  ALT 17 - 63 U/L - 32 -    Assessment/Plan:(ICD-10's: K81.0) 82 y.o.maledoing overall okay with leukocytosis slightly elevated today vs yesterday, though still decreased over the past few days, 2 Days s/p percutaneous drainage of gallbladder fossa (no biloma present) and 6 Days Post-Op s/p laparoscopic cholecystectomyfor suppurative/emphysematous/gangrenous cholecystitis, complicated by pre-op gram negative bacteremia and pertinent comorbidities including CAD s/p CABG and PCI for MI (2013), atrial fibrillation not on anticoagulation due to fall risk, AKI superimposed on CKD, HTN, HLD, Parkinson's disease, and osteoarthritis.  - heart-healthy diet as tolerated - agree with antibiotics including vancomycin and fluconazole  - medical management and ultimately discharge planning  as per medical team - activity as tolerated with appropriate assist, DVT prophylaxis - please call if any questions or concerns  All of the above findings and recommendations were discussed with the patient, patient's family, and medical physician, and  all of patient's and family's questions were answered to their expressed satisfaction.  -- Marilynne Drivers Rosana Hoes, MD, [place]: [facility] Office: [phone]

## 2016-05-30 NOTE — Progress Notes (Signed)
PROGRESS NOTE  Jesus Maldonado [id number] DOB: 29-Sep-1930 DOA: 05/21/2016 PCP: Kendrick Ranch, MD  Brief Narrative: 82 year-old man presented with abdominal pain. Imaging consistent with cholecystitis. Developed bacteremia. Underwent upper scopic cholecystectomy with removal of gangrenous gallbladder 11/17. Thereafter developed marked leukocytosis and follow-up CT revealed a collection of fluid in the gallbladder fossa. Underwent drain placement. Cultures pending.   Assessment/Plan: 1. Acute cholecystitis, status post cholecystectomy with removal of gangrenous gallbladder 11/17. Subsequent imaging suggested collection of fluid in the gallbladder fossa, underwent drain placement. White count slightly elevated but overall continues to improve. 2. Escherichia coli bacteremia/sepsis, secondary to above. Repeat blood cultures remain no growth. 3. Marked leukocytosis. Suspect leukemoid reaction. Minimal change. Hemodynamics remain stable. 4. Acute kidney injury. Resolved. Secondary sepsis. 5. Anemia of critical illness. Status post transfusion 1 unit packed red blood cells 11/21. Hemoglobin stable. 6. Atrial fibrillation. Remains in sinus rhythm. Not on any anticoagulation as an outpatient secondary to fall risk. 7. Bilateral upper extremity edema right greater than left. Venous Doppler negative for DVT. Likely secondary to volume resuscitation. 8. Parkinson's disease. Noted 9. Dysphagia. Speech therapy consult appreciated. Continue dysphagia 1 diet, thin liquids.   Patient is continuing to improve. Will move upstairs.   We will narrow antibiotics alone/24 culture data remains unchanged  Follow CBC intermittently with next check Saturday.   DVT prophylaxis: SCDs Code Status: Full Family Communication: None Disposition Plan: Discharge with home health.  Patient refused SNF.  Murray Hodgkins, MD  [facility] Direct contact: [phone] --Via amion app  OR  --[email or web address]; password [email or web address]  7PM-7AM contact night coverage as above 05/30/2016, 6:01 AM  LOS: 9 days   Consultants:  General surgery  Cardiology  Gastroenterology  ST:  Diet recommendations: Thin liquid;Dysphagia 3 (mechanical soft) Liquids provided via: Cup;Straw Medication Administration: Whole meds with puree (Pt expressed this method 05/30/16 to SLP) Supervision: Patient able to self feed;Intermittent supervision to cue for compensatory strategies Compensations: Slow rate;Follow solids with liquid Postural Changes and/or Swallow Maneuvers: Seated upright 90 degrees;Upright 30-60 min after meal (ideally out of bed for po)  Procedures:  Transfused 1 unit PRBC 11/21  Echo - Left ventricle: The cavity size was normal. Wall thickness was increased in a pattern of mild LVH. Systolic function was normal. The estimated ejection fraction was in the range of 60% to 65%. Wall motion was normal; there were no regional wall motion abnormalities. Doppler parameters are consistent with abnormal left ventricular relaxation (grade 1 diastolic dysfunction). - Aortic valve: Mildly calcified annulus. Trileaflet; mildly calcified leaflets. There was mild regurgitation. - Mitral valve: Calcified annulus. Mildly calcified leaflets . There was trivial regurgitation. - Right atrium: Central venous pressure (est): 3 mm Hg. - Tricuspid valve: There was trivial regurgitation. - Pulmonary arteries: PA peak pressure: 25 mm Hg (S). - Pericardium, extracardiac: There was no pericardial effusion.   Antimicrobials:  Vancomycin 11/14 >> 11/15, 11/20 >>  Zosyn 11/14 >> 11/16, 11/20 >>  Rocephin 11/16 >> 11/20  Interval history/Subjective: Patient denies any pain, nausea, and vomiting. Patient is eating well.    Objective: Vitals:   05/30/16 0000 05/30/16 0100 05/30/16 0200 05/30/16 0300  BP: (!) 109/53 (!) 116/58 (!) 115/56 (!) 117/56  Pulse: 78 78 80 78  Resp: (!) 24  (!) 21 (!) 21 19  Temp:      TempSrc:      SpO2: 97% 98% 96% 100%  Weight:      Height:        Intake/Output Summary (Last  24 hours) at 05/30/16 0601 Last data filed at 05/30/16 0300  Gross per 24 hour  Intake             1230 ml  Output             1200 ml  Net               30 ml     Filed Weights   05/26/16 0500 05/27/16 0500 05/29/16 0452  Weight: 108.1 kg (238 lb 5.1 oz) 108.1 kg (238 lb 5.1 oz) 118 kg (260 lb 2.3 oz)    Exam:    Constitutional:  . Appears calm and comfortable, weak Respiratory:  . CTA bilaterally, no w/r/r.  . Respiratory effort normal. No retractions or accessory muscle use Cardiovascular:  . RRR, no m/r/g . 1+ bilateral LE extremity edema   . 2+ arm swelling, right greater than left . Telemetry SR Abdomen:  . Soft, nontender Musculoskeletal:   Moves all extremities well. Psychiatric: Alert, speech fluent and clear.  I have personally reviewed following labs and imaging studies:  WBC 23.8, increasing.  Hgb 7.6, stable.  Fluid culture pending  Scheduled Meds: . atorvastatin  40 mg Oral QHS  . carbidopa-levodopa  1 tablet Oral TID  . chlorhexidine  15 mL Mouth Rinse BID  . fluconazole (DIFLUCAN) IV  400 mg Intravenous Q24H  . furosemide  20 mg Intravenous Daily  . magic mouthwash  15 mL Oral QID   And  . lidocaine  15 mL Mouth/Throat QID  . mouth rinse  15 mL Mouth Rinse q12n4p  . metoprolol  12.5 mg Oral BID  . pantoprazole  40 mg Oral Daily  . piperacillin-tazobactam (ZOSYN)  IV  3.375 g Intravenous Q8H  . sodium chloride flush  3 mL Intravenous Q12H  . vancomycin  1,250 mg Intravenous Q24H   Continuous Infusions:  Principal Problem:   Acute cholecystitis Active Problems:   HTN (hypertension)   CAD (coronary artery disease)   Atrial fibrillation (HCC)   Elevated LFTs   AKI (acute kidney injury) ([place])   E coli bacteremia   Parkinson's disease ([place])   LOS: 9 days      By signing my name below, I, Clerance Lav,  attest that this documentation has been prepared under the direction and in the presence of Alvenia Treese P. Sarajane Jews, MD. Electronically signed: Clerance Lav, Scribe.  05/30/16 8:56 AM  I personally performed the services described in this documentation. All medical record entries made by the scribe were at my direction. I have reviewed the chart and agree that the record reflects my personal performance and is accurate and complete. Murray Hodgkins, MD

## 2016-05-31 LAB — BASIC METABOLIC PANEL
ANION GAP: 5 (ref 5–15)
BUN: 20 mg/dL (ref 6–20)
CALCIUM: 7.7 mg/dL — AB (ref 8.9–10.3)
CHLORIDE: 105 mmol/L (ref 101–111)
CO2: 26 mmol/L (ref 22–32)
CREATININE: 1.11 mg/dL (ref 0.61–1.24)
GFR calc non Af Amer: 59 mL/min — ABNORMAL LOW (ref >60)
Glucose, Bld: 97 mg/dL (ref 65–99)
Potassium: 3.5 mmol/L (ref 3.5–5.1)
SODIUM: 136 mmol/L (ref 135–145)

## 2016-05-31 MED ORDER — VANCOMYCIN HCL IN DEXTROSE 750-5 MG/150ML-% IV SOLN
750.0000 mg | Freq: Two times a day (BID) | INTRAVENOUS | Status: DC
  Administered 2016-05-31 – 2016-06-01 (×2): 750 mg via INTRAVENOUS
  Filled 2016-05-31 (×6): qty 150

## 2016-05-31 NOTE — Care Management Important Message (Signed)
Important Message  Patient Details  Name: Jesus Maldonado MRN: [id number] Date of Birth: 1930-12-14   Medicare Important Message Given:  Yes    Marizol Borror, Chauncey Reading, RN 05/31/2016, 2:27 PM

## 2016-05-31 NOTE — Progress Notes (Signed)
Physical Therapy Treatment Patient Details Name: Jesus Maldonado MRN: [id number] DOB: [birth year] Today's Date: 05/31/2016    History of Present Illness 82 y.o. male with hx of CAD, afib post CABGx4 not on anticoagulation, HTN, HLD, parkinson disease, presented to the ER with abdominal pain for several days.  He has no CP, SOB, nausea or vomiting.  Evaluation in the ER with abdominal CT revealed evidence of acute cholecystitis, cholelithiasis, but normal caliber CBD.  Incidentally, he was found to have a 1.4cm hypodense lesion on his liver (daughter was told).  His liver Fx tests showed AST 400's ALT 200's AlkPHO 400 total bili 4.  He has a leukocytosis with WBC of 15K.  His Cr was slightly elevated at 1.6.  EDP consulted Dr Arnoldo Morale of general surgery, who will see him in consultation, suggested GI consultation as well, and hospitalist was asked to admit him for acute cholecystitis, cholelithiaisis, r/out obstructive biliary disease.   He is s/p lap chole on 05/24/2016, Still with leukocytosis, though improved with the addition of Diflucan and vancomycin. S/P CT-guided drainage of gallbladder fossa 11/21 with JP drain.    PT Comments    Pt received in bed, and granddaughters present.  Pt was agreeable to PT tx.  Pt expressed that he had not been OOB yesterday.  Pt continues to require increased assistance for supine<>sit with Mod A, and sit<>stand with Mod A.  Once he is standing, he demonstrates fwd flexed posture and requires cues to stand upright.  Pt expressed that he was very fatigued today, and therefore pt was only assisted with bed<>chair transfer.  In the process he was noted to be soiled, therefore he performed multiple sit<>stand transfers during the process of hygiene.  Continue to strongly recommend OT consult for ADL's, as well as recommendation for SNF upon d/c.   However, family and pt have both made it clear that their plans are to have the pt go home.    Follow Up Recommendations  SNF;Home health PT;Supervision/Assistance - 24 hour (however, pt and family have expressed the desire to go home. )     [facility] bed (Family has expressed they are able to obtain a w/c and BSC.  Pt already has a RW. )    Recommendations for Other Services OT consult     Precautions / Restrictions Precautions Precautions: Fall Precaution Comments: Due to immobility Restrictions Weight Bearing Restrictions: No    Mobility  Bed Mobility Overal bed mobility: Needs Assistance Bed Mobility: Supine to Sit     Supine to sit: Mod assist;HOB elevated (increased time, and assist only required to assist trunk up into sitting.)        Transfers Overall transfer level: Needs assistance Equipment used: Rolling walker (2 wheeled) Transfers: Sit to/from [facility] Sit to Stand: Mod assist;From elevated surface (x2 from the bed for hygiene, and then x 2 from the chair for further hygiene. ) Stand pivot transfers: Min assist (vc's for upright posture.  Pt takes very shuffled steps while transferring to the chair, and requires vc's to reach back for the chair prior to sitting. )          Ambulation/Gait Ambulation/Gait assistance:  (NA - pt expressed he was feeling weak.  )               Stairs            Wheelchair Mobility    Modified Rankin (Stroke Patients Only)  Balance Overall balance assessment: Needs assistance Sitting-balance support: Bilateral upper extremity supported;Feet supported Sitting balance-Leahy Scale: Fair     Standing balance support: Bilateral upper extremity supported Standing balance-Leahy Scale: Poor Standing balance comment: continues to have initial posterior lean.                     Cognition Arousal/Alertness: Awake/alert Behavior During Therapy: Flat affect Overall Cognitive Status: Within Functional Limits for tasks assessed                      Exercises  General Exercises - Upper Extremity Shoulder Flexion: AROM;5 reps;Both;Supine;Limitations Shoulder Flexion Limitations: R shoulder old RTC injury General Exercises - Lower Extremity Heel Slides: AROM;5 reps;Both;Supine Other Exercises Other Exercises: supine lumbar rotation x 5 each direction to prepare for bed mobility    General Comments        Pertinent Vitals/Pain Pain Assessment: No/denies pain    Home Living                      Prior Function            PT Goals (current goals can now be found in the care plan section) Acute Rehab PT Goals Patient Stated Goal: To go home PT Goal Formulation: With patient/family Time For Goal Achievement: 06/11/16 Potential to Achieve Goals: Fair Progress towards PT goals: Progressing toward goals    Frequency    Min 5X/week      PT Plan Current plan remains appropriate    Co-evaluation             End of Session Equipment Utilized During Treatment: Gait belt Activity Tolerance: Patient tolerated treatment well Patient left: in chair;with call bell/phone within reach;with family/visitor present;with nursing/sitter in room     Time: 1335-1420 PT Time Calculation (min) (ACUTE ONLY): 45 min  Charges:  $Therapeutic Exercise: 8-22 mins $Therapeutic Activity: 8-22 mins $Self Care/Home Management: 8-22                    G Codes:      Beth Bronislaus Verdell, PT, DPT X: [phone]

## 2016-05-31 NOTE — Progress Notes (Signed)
PROGRESS NOTE  Jesus Maldonado [id number] DOB: Oct 25, 1930 DOA: 05/21/2016 PCP: Kendrick Ranch, MD  Brief Narrative: 82 year-old man presented with abdominal pain. Imaging consistent with cholecystitis. Developed bacteremia. Underwent upper scopic cholecystectomy with removal of gangrenous gallbladder 11/17. Thereafter developed marked leukocytosis and follow-up CT revealed a collection of fluid in the gallbladder fossa. Underwent drain placement. Cultures pending.   Assessment/Plan: 1. Acute cholecystitis, status post cholecystectomy with removal of gangrenous gallbladder 11/17. Subsequent imaging suggested collection of fluid in the gallbladder fossa, underwent drain placement. Clinically improving. 2. Escherichia coli bacteremia/sepsis, secondary to above. Repeat blood cultures remain no growth this day. 3. Marked leukocytosis. Repeat CBC in the morning. 4. Acute kidney injury. Resolved. Secondary to sepsis. 5. Anemia of critical illness. Status post transfusion 1 unit packed red blood cells 11/21. Stable. 6. Atrial fibrillation. Stable. On last check was in sinus rhythm. No anticoagulation given history of fall risk. 7. Bilateral upper extremity edema right greater than left. Venous Doppler negative for DVT. Likely secondary to volume resuscitation. 8. Parkinson's disease. Stable. 9. Dysphagia. Continue dysphagia 1 diet per speech therapy.    He appears better today. Significance of one episode of fever last night unclear but may have been atelectasis. Hemodynamics are stable, per surgeon also felt to be improving. Repeat blood cultures no growth to date. Body fluid culture pending, no growth. He appears to be recovering well.  Repeat CBC in the morning. Possible discharge next 48 hours if continues to improve.  DVT prophylaxis: SCDs Code Status: Full Family Communication: None Disposition Plan: Discharge with home health.  Patient refused SNF.  Murray Hodgkins,  MD  [facility] Direct contact: [phone] --Via amion app OR  --[email or web address]; password [email or web address]  7PM-7AM contact night coverage as above 05/31/2016, 1:32 PM  LOS: 10 days   Consultants:  General surgery  Cardiology  Gastroenterology  ST:  Diet recommendations: Thin liquid;Dysphagia 3 (mechanical soft) Liquids provided via: Cup;Straw Medication Administration: Whole meds with puree (Pt expressed this method 05/30/16 to SLP) Supervision: Patient able to self feed;Intermittent supervision to cue for compensatory strategies Compensations: Slow rate;Follow solids with liquid Postural Changes and/or Swallow Maneuvers: Seated upright 90 degrees;Upright 30-60 min after meal (ideally out of bed for po)  Procedures:  Transfused 1 unit PRBC 11/21  Echo - Left ventricle: The cavity size was normal. Wall thickness was increased in a pattern of mild LVH. Systolic function was normal. The estimated ejection fraction was in the range of 60% to 65%. Wall motion was normal; there were no regional wall motion abnormalities. Doppler parameters are consistent with abnormal left ventricular relaxation (grade 1 diastolic dysfunction). - Aortic valve: Mildly calcified annulus. Trileaflet; mildly calcified leaflets. There was mild regurgitation. - Mitral valve: Calcified annulus. Mildly calcified leaflets . There was trivial regurgitation. - Right atrium: Central venous pressure (est): 3 mm Hg. - Tricuspid valve: There was trivial regurgitation. - Pulmonary arteries: PA peak pressure: 25 mm Hg (S). - Pericardium, extracardiac: There was no pericardial effusion.   Antimicrobials:  Vancomycin 11/14 >> 11/15, 11/20 >>  Zosyn 11/14 >> 11/16, 11/20 >>  Rocephin 11/16 >> 11/20  Interval history/Subjective:  Fever last night. Feels well today. No pain. No trouble breathing. No complaints.  Objective: Vitals:   05/30/16 0900 05/30/16 1133 05/30/16 2225 05/31/16 0537   BP: (!) 123/54 (!) 111/55 (!) 118/50 (!) 112/56  Pulse: 93 87 89 84  Resp: (!) 25 20 (!) 21 (!) 22  Temp:  98.2 F (36.8 C) (!) 101.2 F (38.4  C) 98.3 F (36.8 C)  TempSrc:  Oral Oral Oral  SpO2: 100% 96% 94% 99%  Weight:      Height:        Intake/Output Summary (Last 24 hours) at 05/31/16 1332 Last data filed at 05/31/16 0600  Gross per 24 hour  Intake              691 ml  Output             1915 ml  Net            -1224 ml     Filed Weights   05/27/16 0500 05/29/16 0452 05/30/16 0500  Weight: 108.1 kg (238 lb 5.1 oz) 118 kg (260 lb 2.3 oz) 106.6 kg (235 lb 0.2 oz)    Exam: Constitutional:   Appears better today, calm, comfortable Respiratory:   Clear to auscultation bilaterally. No wheezes, rales or rhonchi. Normal respiratory effort. Cardiovascular:   Regular rate and rhythm. No murmur, rub or gallop. No lower extremity edema. Abdomen:   Soft, nontender, nondistended Psychiatric:   Grossly normal mood and affect. Speech fluent and appropriate.  I have personally reviewed following labs and imaging studies:  BM 4, urine output 1900, -3.1 L since admission  BUN and creatinine have normalized. Potassium normal.  Scheduled Meds: . atorvastatin  40 mg Oral QHS  . carbidopa-levodopa  1 tablet Oral TID  . chlorhexidine  15 mL Mouth Rinse BID  . fluconazole (DIFLUCAN) IV  400 mg Intravenous Q24H  . furosemide  20 mg Intravenous Daily  . magic mouthwash  15 mL Oral QID   And  . lidocaine  15 mL Mouth/Throat QID  . mouth rinse  15 mL Mouth Rinse q12n4p  . metoprolol  12.5 mg Oral BID  . pantoprazole  40 mg Oral Daily  . piperacillin-tazobactam (ZOSYN)  IV  3.375 g Intravenous Q8H  . sodium chloride flush  3 mL Intravenous Q12H  . vancomycin  750 mg Intravenous Q12H   Continuous Infusions:  Principal Problem:   Acute cholecystitis Active Problems:   HTN (hypertension)   CAD (coronary artery disease)   Atrial fibrillation (HCC)   Elevated LFTs    AKI (acute kidney injury) ([place])   E coli bacteremia   Parkinson's disease ([place])   LOS: 10 days

## 2016-05-31 NOTE — Progress Notes (Addendum)
7 Days Post-Op  Subjective: Patient appears brighter this morning. Denies any abdominal pain.  Objective: Vital signs in last 24 hours: Temp:  [98.2 F (36.8 C)-101.2 F (38.4 C)] 98.3 F (36.8 C) (11/24 0537) Pulse Rate:  [84-89] 84 (11/24 0537) Resp:  [20-22] 22 (11/24 0537) BP: (111-118)/(50-56) 112/56 (11/24 0537) SpO2:  [94 %-99 %] 99 % (11/24 0537) Last BM Date: 05/31/16  Intake/Output from previous day: 11/23 0701 - 11/24 0700 In: 751 [P.O.:360; I.V.:16; IV Piggyback:365] Out: 1915 [Urine:1900; Drains:15] Intake/Output this shift: No intake/output data recorded.  General appearance: alert, cooperative and no distress GI: Soft, incisions healing well. All drainage serosanguineous in nature. No bile.  Lab Results:   Recent Labs  05/29/16 0422 05/30/16 0455  WBC 19.6* 23.8*  HGB 7.3* 7.6*  HCT 24.2* 24.1*  PLT 567* 543*   BMET  Recent Labs  05/29/16 0422 05/31/16 0632  NA 139 136  K 3.7 3.5  CL 106 105  CO2 27 26  GLUCOSE 93 97  BUN 38* 20  CREATININE 1.29* 1.11  CALCIUM 7.5* 7.7*   PT/INR No results for input(s): LABPROT, INR in the last 72 hours.  Studies/Results: US Venous Img Upper Uni Right  Result Date: 05/29/2016 CLINICAL DATA:  Right upper extremity edema for 3 days EXAM: RIGHT UPPER EXTREMITY VENOUS DOPPLER ULTRASOUND TECHNIQUE: Gray-scale sonography with graded compression, as well as color Doppler and duplex ultrasound were performed to evaluate the upper extremity deep venous system from the level of the subclavian vein and including the jugular, axillary, basilic, radial, ulnar and upper cephalic vein. Spectral Doppler was utilized to evaluate flow at rest and with distal augmentation maneuvers. COMPARISON:  None. FINDINGS: Contralateral Subclavian Vein: Respiratory phasicity is normal and symmetric with the symptomatic side. No evidence of thrombus. Normal compressibility. Internal Jugular Vein: No evidence of thrombus. Normal  compressibility, respiratory phasicity and response to augmentation. Subclavian Vein: No evidence of thrombus. Normal compressibility, respiratory phasicity and response to augmentation. Axillary Vein: No evidence of thrombus. Normal compressibility, respiratory phasicity and response to augmentation. Cephalic Vein: No evidence of thrombus. Normal compressibility, respiratory phasicity and response to augmentation. Basilic Vein: No evidence of thrombus. Normal compressibility, respiratory phasicity and response to augmentation. Brachial Veins: No evidence of thrombus. Normal compressibility, respiratory phasicity and response to augmentation. Radial Veins: No evidence of thrombus. Normal compressibility, respiratory phasicity and response to augmentation. Ulnar Veins: No evidence of thrombus. Normal compressibility, respiratory phasicity and response to augmentation. Venous Reflux:  None visualized. Other Findings:  None visualized. IMPRESSION: No evidence of deep venous thrombosis. Electronically Signed   By: Jerilynn Mages.  Shick M.D.   On: 05/29/2016 14:25    Anti-infectives: Anti-infectives    Start     Dose/Rate Route Frequency Ordered Stop   05/31/16 0830  vancomycin (VANCOCIN) IVPB 750 mg/150 ml premix     750 mg 150 mL/hr over 60 Minutes Intravenous Every 12 hours 05/30/16 2033     05/28/16 1930  vancomycin (VANCOCIN) 1,250 mg in sodium chloride 0.9 % 250 mL IVPB     1,250 mg 166.7 mL/hr over 90 Minutes Intravenous Every 24 hours 05/27/16 1806 05/30/16 2359   05/27/16 1930  vancomycin (VANCOCIN) IVPB 1000 mg/200 mL premix     1,000 mg 200 mL/hr over 60 Minutes Intravenous  Once 05/27/16 1806 05/27/16 2111   05/27/16 1830  vancomycin (VANCOCIN) IVPB 1000 mg/200 mL premix     1,000 mg 200 mL/hr over 60 Minutes Intravenous  Once 05/27/16 1806 05/27/16 2001  05/27/16 1130  fluconazole (DIFLUCAN) IVPB 200 mg  Status:  Discontinued     200 mg 100 mL/hr over 60 Minutes Intravenous Every 24 hours 05/27/16  1115 05/27/16 1116   05/27/16 1130  fluconazole (DIFLUCAN) IVPB 400 mg     400 mg 100 mL/hr over 120 Minutes Intravenous Every 24 hours 05/27/16 1116     05/27/16 1100  piperacillin-tazobactam (ZOSYN) IVPB 3.375 g     3.375 g 12.5 mL/hr over 240 Minutes Intravenous Every 8 hours 05/27/16 1037     05/23/16 2000  cefTRIAXone (ROCEPHIN) 2 g in dextrose 5 % 50 mL IVPB  Status:  Discontinued     2 g 100 mL/hr over 30 Minutes Intravenous Every 24 hours 05/23/16 1904 05/27/16 0932   05/21/16 1800  vancomycin (VANCOCIN) IVPB 1000 mg/200 mL premix  Status:  Discontinued     1,000 mg 200 mL/hr over 60 Minutes Intravenous Every 24 hours 05/21/16 0753 05/22/16 1559   05/21/16 1000  piperacillin-tazobactam (ZOSYN) IVPB 3.375 g  Status:  Discontinued     3.375 g 12.5 mL/hr over 240 Minutes Intravenous Every 8 hours 05/21/16 0749 05/23/16 1904   05/21/16 0315  metroNIDAZOLE (FLAGYL) IVPB 500 mg     500 mg 100 mL/hr over 60 Minutes Intravenous  Once 05/21/16 0304 05/21/16 0450   05/21/16 0145  piperacillin-tazobactam (ZOSYN) IVPB 3.375 g     3.375 g 100 mL/hr over 30 Minutes Intravenous  Once 05/21/16 0135 05/21/16 0218   05/21/16 0145  vancomycin (VANCOCIN) IVPB 1000 mg/200 mL premix     1,000 mg 200 mL/hr over 60 Minutes Intravenous  Once 05/21/16 0135 05/21/16 0300      Assessment/Plan: s/p Procedure(s): LAPAROSCOPIC CHOLECYSTECTOMY Impression: Patient recovering well. No white blood cell count checked today. Will check again in a.m. Encourage ambulation. No further changes from surgery standpoint. No growth from CT-guided drainage of intra-abdominal fluid collection.  LOS: 10 days    Kassim Guertin A 05/31/2016

## 2016-06-01 ENCOUNTER — Encounter (HOSPITAL_COMMUNITY): Payer: Self-pay | Admitting: *Deleted

## 2016-06-01 LAB — CBC
HCT: 23.9 % — ABNORMAL LOW (ref 39.0–52.0)
HEMOGLOBIN: 7.5 g/dL — AB (ref 13.0–17.0)
MCH: 24.9 pg — ABNORMAL LOW (ref 26.0–34.0)
MCHC: 31.4 g/dL (ref 30.0–36.0)
MCV: 79.4 fL (ref 78.0–100.0)
Platelets: 502 10*3/uL — ABNORMAL HIGH (ref 150–400)
RBC: 3.01 MIL/uL — ABNORMAL LOW (ref 4.22–5.81)
RDW: 20.1 % — AB (ref 11.5–15.5)
WBC: 21.8 10*3/uL — ABNORMAL HIGH (ref 4.0–10.5)

## 2016-06-01 LAB — BODY FLUID CULTURE: CULTURE: NO GROWTH

## 2016-06-01 LAB — CULTURE, BLOOD (ROUTINE X 2)
Culture: NO GROWTH
Culture: NO GROWTH

## 2016-06-01 MED ORDER — DIPHENHYDRAMINE HCL 25 MG PO CAPS
25.0000 mg | ORAL_CAPSULE | Freq: Four times a day (QID) | ORAL | Status: DC | PRN
  Administered 2016-06-01: 25 mg via ORAL
  Filled 2016-06-01: qty 1

## 2016-06-01 MED ORDER — CEFUROXIME AXETIL 250 MG PO TABS
500.0000 mg | ORAL_TABLET | Freq: Two times a day (BID) | ORAL | Status: DC
  Administered 2016-06-01 – 2016-06-03 (×4): 500 mg via ORAL
  Filled 2016-06-01 (×4): qty 2

## 2016-06-01 NOTE — Progress Notes (Signed)
[facility] Hospital Day(s): 11.   Post op day(s): 8 Days Post-Op.   Interval History: Patient seen and examined, no acute events or new complaints overnight. Patient reports he's been gradually feeling better, denies abdominal pain, fever/chills, CP, or SOB.  Review of Systems:  Constitutional: denies fever, chills  HEENT: denies cough or congestion  Respiratory: denies any shortness of breath  Cardiovascular: denies chest pain or palpitations  Gastrointestinal: denies abdominal pain, N/V, or diarrhea Genitourinary: denies burning with urination or urinary frequency Musculoskeletal: denies pain, decreased motor or sensation Integumentary: denies any other rashes or skin discolorations Neurological: denies HA or vision/hearing changes   Vital signs in last 24 hours: [min-max] current  Temp:  [98.1 F (36.7 C)-98.2 F (36.8 C)] 98.2 F (36.8 C) (11/25 0617) Pulse Rate:  [89-91] 89 (11/25 0617) Resp:  [22-23] 22 (11/25 0617) BP: (115-120)/(51-52) 115/52 (11/25 0617) SpO2:  [94 %-96 %] 94 % (11/25 0617)     Height: 5\' 10"  (177.8 cm) Weight: 106.6 kg (235 lb 0.2 oz) BMI (Calculated): 33.9   Intake/Output this shift:  No intake/output data recorded.   Intake/Output last 2 shifts:  @IOLAST2SHIFTS @   Physical Exam:  Constitutional: alert, cooperative and no distress  HENT: normocephalic without obvious abnormality  Eyes: PERRL, EOM's grossly intact and symmetric  Neuro: CN II - XII grossly intact and symmetric without deficit  Respiratory: breathing non-labored at rest  Cardiovascular: regular rate and sinus rhythm  Gastrointestinal: soft, obese, and non-tender with incisions well-approximated without erythema or drainage, RUQ drain with scant thin dark burgundy fluid, no green fluid  Musculoskeletal: UE and LE FROM, 2+ B/L upper extremity edema, motor and sensation grossly intact, NT   Labs:  CBC:  Lab Results  Component Value Date   WBC 21.8 (H) 06/01/2016    RBC 3.01 (L) 06/01/2016   BMP:  Lab Results  Component Value Date   GLUCOSE 97 05/31/2016   CO2 26 05/31/2016   BUN 20 05/31/2016   CREATININE 1.11 05/31/2016   CALCIUM 7.7 (L) 05/31/2016     Imaging studies: No new pertinent imaging studies   Assessment/Plan:(ICD-10's: K81.0) 82 y.o.maledoing overall okay with leukocytosis stable to slowly decreasing, 4 Days s/p percutaneous drainage of gallbladder fossa (no biloma) and 8DaysPost-Op s/p laparoscopic cholecystectomyfor suppurative/emphysematous /gangrenous cholecystitis, complicated by pre-operative gram negative bacteremia and pertinent comorbidities including CAD s/p CABG and PCI for MI (2013), atrial fibrillation not on anticoagulation due to fall risk, AKI superimposed on CKD, HTN, HLD, Parkinson's disease, and osteoarthritis.  - heart-healthy diet as tolerated - complete prescribed course of antibiotics             - medical management and ultimately discharge planning as per medical team - activity as tolerated with appropriate assist, DVT prophylaxis  - will plan to remove JP drain prior to discharge - please call if any questions or concerns  All of the above findings and recommendations were discussed with the patient, patient's family, and medical physician, and all of patient's and family's questions were answered to their expressed satisfaction.  -- Marilynne Drivers Rosana Hoes, MD, [place]: [facility] Office: [phone]

## 2016-06-01 NOTE — Progress Notes (Signed)
PROGRESS NOTE  Jesus Maldonado [id number] DOB: 1931-06-28 DOA: 05/21/2016 PCP: Kendrick Ranch, MD  Brief Narrative: 82 year-old man presented with abdominal pain. Imaging consistent with cholecystitis. Developed bacteremia. Underwent upper scopic cholecystectomy with removal of gangrenous gallbladder 11/17. Thereafter developed marked leukocytosis and follow-up CT revealed a collection of fluid in the gallbladder fossa. Underwent drain placement. Cultures pending.   Assessment/Plan: 1. Acute cholecystitis, status post cholecystectomy with removal of gangrenous gallbladder 11/17. Subsequent imaging suggested collection of fluid in the gallbladder fossa, underwent drain placement. Clinically improving. 2. Escherichia coli bacteremia/sepsis, secondary to above. Repeat blood cultures remain no growth this day. 3. Marked leukocytosis. Repeat CBC in the morning. 4. Acute kidney injury. Resolved. Secondary to sepsis. 5. Anemia of critical illness. Status post transfusion 1 unit packed red blood cells 11/21. Stable. 6. Atrial fibrillation. Stable. On last check was in sinus rhythm. No anticoagulation given history of fall risk. 7. Bilateral upper extremity edema right greater than left. Venous Doppler negative for DVT. Likely secondary to volume resuscitation. 8. Parkinson's disease. Stable. 9. Dysphagia. Continue dysphagia 1 diet per speech therapy.    Continues to improve daily. Body fluid culture and repeat blood cultures no growth, final. He is afebrile more than 24 hours and hemodynamics remained stable. White blood cell count is elevated but there is no other evidence to suggest ongoing infection. Discussed with Dr. Rosana Hoes, plan transition to oral antibiotics. Continue IV diuresis with anticipate discharge to skilled nursing facility next 48 hours, prior to this Dr. Rosana Hoes will likely remove drain and staples.   Discussed with daughter bedside. Patient now requests skilled nursing  facility.  DVT prophylaxis: SCDs Code Status: Full Family Communication: None Disposition Plan: Discharge with home health.  Patient refused SNF.  Murray Hodgkins, MD  [facility] Direct contact: [phone] --Via amion app OR  --[email or web address]; password [email or web address]  7PM-7AM contact night coverage as above 06/01/2016, 3:09 PM  LOS: 11 days   Consultants:  General surgery  Cardiology  Gastroenterology  ST:  Diet recommendations: Thin liquid;Dysphagia 3 (mechanical soft) Liquids provided via: Cup;Straw Medication Administration: Whole meds with puree (Pt expressed this method 05/30/16 to SLP) Supervision: Patient able to self feed;Intermittent supervision to cue for compensatory strategies Compensations: Slow rate;Follow solids with liquid Postural Changes and/or Swallow Maneuvers: Seated upright 90 degrees;Upright 30-60 min after meal (ideally out of bed for po)  Procedures:  Transfused 1 unit PRBC 11/21  Echo - Left ventricle: The cavity size was normal. Wall thickness was increased in a pattern of mild LVH. Systolic function was normal. The estimated ejection fraction was in the range of 60% to 65%. Wall motion was normal; there were no regional wall motion abnormalities. Doppler parameters are consistent with abnormal left ventricular relaxation (grade 1 diastolic dysfunction). - Aortic valve: Mildly calcified annulus. Trileaflet; mildly calcified leaflets. There was mild regurgitation. - Mitral valve: Calcified annulus. Mildly calcified leaflets . There was trivial regurgitation. - Right atrium: Central venous pressure (est): 3 mm Hg. - Tricuspid valve: There was trivial regurgitation. - Pulmonary arteries: PA peak pressure: 25 mm Hg (S). - Pericardium, extracardiac: There was no pericardial effusion.   Antimicrobials:  Vancomycin 11/14 >> 11/15, 11/20 >> 11/25  Zosyn 11/14 >> 11/16, 11/20 >> 11/25  Rocephin 11/16 >> 11/20  Interval  history/Subjective: Feels very good. No pain. Bowels moving. Eating well. Breathing well.  Objective: Vitals:   05/31/16 0537 05/31/16 2130 06/01/16 0617 06/01/16 1447  BP: (!) 112/56 (!) 120/51 (!) 115/52 (!) 122/49  Pulse: 84  91 89 89  Resp: (!) 22 (!) 23 (!) 22 20  Temp: 98.3 F (36.8 C) 98.1 F (36.7 C) 98.2 F (36.8 C) 98.7 F (37.1 C)  TempSrc: Oral Oral Oral Oral  SpO2: 99% 96% 94% 98%  Weight:      Height:        Intake/Output Summary (Last 24 hours) at 06/01/16 1509 Last data filed at 06/01/16 0618  Gross per 24 hour  Intake              516 ml  Output              565 ml  Net              -49 ml     Filed Weights   05/27/16 0500 05/29/16 0452 05/30/16 0500  Weight: 108.1 kg (238 lb 5.1 oz) 118 kg (260 lb 2.3 oz) 106.6 kg (235 lb 0.2 oz)    Exam: Constitutional:   Appears well, calm, comfortable. Sitting up eating lunch. Respiratory:   Clear to auscultation bilaterally. No wheezes, rales or rhonchi. Normal respiratory effort. Cardiovascular:   Regular rate and rhythm. No murmur, rub or gallop. 1+ bilateral lower extremity edema. 3+ right upper extremity edema. Abdomen:   Soft, nontender Psychiatric:   Alert. Speech fluent and clear.   I have personally reviewed following labs and imaging studies:  Urine output 1525. -3.7 L since admission.  WBC 21.8. Trending down. Hemoglobin stable 7.5.  Scheduled Meds: . atorvastatin  40 mg Oral QHS  . carbidopa-levodopa  1 tablet Oral TID  . chlorhexidine  15 mL Mouth Rinse BID  . fluconazole (DIFLUCAN) IV  400 mg Intravenous Q24H  . furosemide  20 mg Intravenous Daily  . magic mouthwash  15 mL Oral QID   And  . lidocaine  15 mL Mouth/Throat QID  . mouth rinse  15 mL Mouth Rinse q12n4p  . metoprolol  12.5 mg Oral BID  . pantoprazole  40 mg Oral Daily  . piperacillin-tazobactam (ZOSYN)  IV  3.375 g Intravenous Q8H  . sodium chloride flush  3 mL Intravenous Q12H  . vancomycin  750 mg Intravenous Q12H     Continuous Infusions:  Principal Problem:   Acute cholecystitis Active Problems:   HTN (hypertension)   CAD (coronary artery disease)   Atrial fibrillation (HCC)   Elevated LFTs   AKI (acute kidney injury) ([place])   E coli bacteremia   Parkinson's disease ([place])   LOS: 11 days

## 2016-06-02 LAB — BASIC METABOLIC PANEL WITH GFR
Anion gap: 5 (ref 5–15)
BUN: 17 mg/dL (ref 6–20)
CO2: 28 mmol/L (ref 22–32)
Calcium: 7.6 mg/dL — ABNORMAL LOW (ref 8.9–10.3)
Chloride: 102 mmol/L (ref 101–111)
Creatinine, Ser: 1.03 mg/dL (ref 0.61–1.24)
GFR calc Af Amer: >60 mL/min
GFR calc non Af Amer: >60 mL/min
Glucose, Bld: 97 mg/dL (ref 65–99)
Potassium: 3.2 mmol/L — ABNORMAL LOW (ref 3.5–5.1)
Sodium: 135 mmol/L (ref 135–145)

## 2016-06-02 MED ORDER — POTASSIUM CHLORIDE CRYS ER 20 MEQ PO TBCR
40.0000 meq | EXTENDED_RELEASE_TABLET | Freq: Once | ORAL | Status: AC
  Administered 2016-06-02: 40 meq via ORAL
  Filled 2016-06-02: qty 4

## 2016-06-02 MED ORDER — GUAIFENESIN 100 MG/5ML PO SOLN
5.0000 mL | ORAL | Status: DC | PRN
  Administered 2016-06-02: 100 mg via ORAL
  Filled 2016-06-02: qty 5

## 2016-06-02 MED ORDER — FUROSEMIDE 10 MG/ML IJ SOLN
20.0000 mg | Freq: Once | INTRAMUSCULAR | Status: AC
  Administered 2016-06-02: 20 mg via INTRAVENOUS
  Filled 2016-06-02: qty 2

## 2016-06-02 NOTE — Progress Notes (Addendum)
PROGRESS NOTE  BRENTTON CHRISCO [id number] DOB: 02/08/1931 DOA: 05/21/2016 PCP: Kendrick Ranch, MD  Brief Narrative: 82 year-old man presented with abdominal pain. Imaging consistent with cholecystitis. Developed bacteremia. Underwent upper scopic cholecystectomy with removal of gangrenous gallbladder 11/17. Thereafter developed marked leukocytosis and follow-up CT revealed a collection of fluid in the gallbladder fossa. Underwent drain placement. Cultures pending.   Assessment/Plan: 1. Acute cholecystitis, status post cholecystectomy with removal of gangrenous gallbladder 11/17. Subsequent imaging suggested collection of fluid in the gallbladder fossa, underwent drain placement. Improving. 2. Escherichia coli bacteremia/sepsis, secondary to above. Repeat blood cultures no growth, final. No fever. On oral abx. 3. Marked leukocytosis. Likely secondary to acute illness, lagging behind clinical improvement, no evidence of ongoing infection. 4. Acute kidney injury. Resolved. Secondary to sepsis. 5. Anemia of critical illness. Status post transfusion 1 unit packed red blood cells 11/21. Remains stable. 6. Atrial fibrillation. Insinus rhythm. No anticoagulation given history of fall risk. 7. Bilateral upper extremity edema right greater than left, bilateral LE edema. Venous Doppler negative for DVT. Secondary to volume resuscitation. Improving with diuresis. 8. Parkinson's disease.   9. Dysphagia. Continue dysphagia 1 diet per speech therapy.    Continues to improve. Repeat BC no growth final and fluid culture no growth, final. No evidence of ongoing infection. Will finish oral abx 11/29  Continue IV diuresis with anticipated discharge to skilled nursing facility next 24 hours, prior to this Dr. Rosana Hoes will likely remove drain and staples.    DVT prophylaxis: SCDs Code Status: Full Family Communication: None Disposition Plan: SNF  Murray Hodgkins, MD  [facility] Direct  contact: [phone] --Via amion app OR  --[email or web address]; password [email or web address]  7PM-7AM contact night coverage as above 06/02/2016, 11:25 AM  LOS: 12 days   Consultants:  General surgery  Cardiology  Gastroenterology  ST:  Diet recommendations: Thin liquid;Dysphagia 3 (mechanical soft) Liquids provided via: Cup;Straw Medication Administration: Whole meds with puree (Pt expressed this method 05/30/16 to SLP) Supervision: Patient able to self feed;Intermittent supervision to cue for compensatory strategies Compensations: Slow rate;Follow solids with liquid Postural Changes and/or Swallow Maneuvers: Seated upright 90 degrees;Upright 30-60 min after meal (ideally out of bed for po)  Procedures:  Transfused 1 unit PRBC 11/21  Echo - Left ventricle: The cavity size was normal. Wall thickness was increased in a pattern of mild LVH. Systolic function was normal. The estimated ejection fraction was in the range of 60% to 65%. Wall motion was normal; there were no regional wall motion abnormalities. Doppler parameters are consistent with abnormal left ventricular relaxation (grade 1 diastolic dysfunction). - Aortic valve: Mildly calcified annulus. Trileaflet; mildly calcified leaflets. There was mild regurgitation. - Mitral valve: Calcified annulus. Mildly calcified leaflets . There was trivial regurgitation. - Right atrium: Central venous pressure (est): 3 mm Hg. - Tricuspid valve: There was trivial regurgitation. - Pulmonary arteries: PA peak pressure: 25 mm Hg (S). - Pericardium, extracardiac: There was no pericardial effusion.   Antimicrobials:  Ceftin 11/25 >> 11/29  Vancomycin 11/14 >> 11/15, 11/20 >> 11/25  Zosyn 11/14 >> 11/16, 11/20 >> 11/25  Rocephin 11/16 >> 11/20  Interval history/Subjective: Feeling fine, no pain, no shortness of breath.  Objective: Vitals:   06/01/16 0617 06/01/16 1447 06/01/16 2124 06/02/16 0547  BP: (!) 115/52 (!) 122/49 (!)  116/54 (!) 117/52  Pulse: 89 89 100 96  Resp: (!) 22 20 (!) 22 20  Temp: 98.2 F (36.8 C) 98.7 F (37.1 C) 98.6 F (37 C) 98.2 F (36.8  C)  TempSrc: Oral Oral Oral Oral  SpO2: 94% 98% 94% 96%  Weight:      Height:        Intake/Output Summary (Last 24 hours) at 06/02/16 1125 Last data filed at 06/02/16 0900  Gross per 24 hour  Intake             1370 ml  Output              328 ml  Net             1042 ml     Filed Weights   05/27/16 0500 05/29/16 0452 05/30/16 0500  Weight: 108.1 kg (238 lb 5.1 oz) 118 kg (260 lb 2.3 oz) 106.6 kg (235 lb 0.2 oz)    Exam: Constitutional:   Appears calm, comfortable Respiratory:   CTA bilaterally anteriorly, posterior crackles bilaterally. No rhonchi or wheezes. Mild increased resp effort. Speaks in full sentences. Cardiovascular:   RRR no m/r/g  1+ bilateral lower extremity edema. 2+ but decreasing RUE edema, now with skin wrinkling. Abdomen:   Incisions healing well, staples still present. Drain covered with bandage. Psychiatric:   Alert, speech fluent and clear   I have personally reviewed following labs and imaging studies:  BC, fluid culture no growth, final  K+ 3.2, remained BMP unremarkable  Scheduled Meds: . atorvastatin  40 mg Oral QHS  . carbidopa-levodopa  1 tablet Oral TID  . cefUROXime  500 mg Oral BID WC  . chlorhexidine  15 mL Mouth Rinse BID  . furosemide  20 mg Intravenous Daily  . magic mouthwash  15 mL Oral QID   And  . lidocaine  15 mL Mouth/Throat QID  . mouth rinse  15 mL Mouth Rinse q12n4p  . metoprolol  12.5 mg Oral BID  . pantoprazole  40 mg Oral Daily  . sodium chloride flush  3 mL Intravenous Q12H   Continuous Infusions:  Principal Problem:   Acute cholecystitis Active Problems:   HTN (hypertension)   CAD (coronary artery disease)   Atrial fibrillation (HCC)   Elevated LFTs   AKI (acute kidney injury) ([place])   E coli bacteremia   Parkinson's disease ([place])   LOS: 12 days

## 2016-06-03 MED ORDER — FUROSEMIDE 40 MG PO TABS: 40.0000 mg | ORAL_TABLET | Freq: Every day | ORAL | Status: AC

## 2016-06-03 MED ORDER — METOPROLOL TARTRATE 25 MG PO TABS: 12.5000 mg | ORAL_TABLET | Freq: Two times a day (BID) | ORAL | Status: AC

## 2016-06-03 MED ORDER — FLUCONAZOLE 200 MG PO TABS: 200.0000 mg | ORAL_TABLET | Freq: Every day | ORAL | Status: AC

## 2016-06-03 MED ORDER — AMOXICILLIN-POT CLAVULANATE 875-125 MG PO TABS: 1.0000 | ORAL_TABLET | Freq: Two times a day (BID) | ORAL | Status: AC

## 2016-06-03 MED ORDER — AMOXICILLIN-POT CLAVULANATE 875-125 MG PO TABS: 1.0000 | ORAL_TABLET | Freq: Two times a day (BID) | ORAL | Status: DC

## 2016-06-03 MED ORDER — FLUCONAZOLE 100 MG PO TABS
200.0000 mg | ORAL_TABLET | Freq: Every day | ORAL | Status: DC
  Administered 2016-06-03: 200 mg via ORAL
  Filled 2016-06-03: qty 2

## 2016-06-03 NOTE — Discharge Summary (Signed)
Physician Discharge Summary  Jesus Maldonado [id number] DOB: October 01, 1930 DOA: 05/21/2016  PCP: Kendrick Ranch, MD  Admit date: 05/21/2016 Discharge date: 06/03/2016  Recommendations for Outpatient Follow-up:  1. Resolution of acute illness 2. Oral Lasix for peripheral edema secondary to volume resuscitation. Recommend repeat BMP 1 week to follow renal function and recommend stopping Lasix when arm and leg edema resolved.  3. F/u SLP services at SNF for diet tolerance and upgrades to regular textures as appropriate. Pt OK for po meds whole with water.  4. Right renal lesions are likely cysts given CT appearance. Recommend attention on follow-up.  5. Hyperechoic left hepatic lobe lesion is favored to represent a hemangioma. Technically indeterminate. Consider follow-up with ultrasound at 6 months to confirm size stability. Alternatively, outpatient pre and post contrast abdominal MRI could confirm a benign hemangioma.   Follow-up Information    Jamesetta So, MD. Schedule an appointment as soon as possible for a visit in 2 week(s).   Specialty:  General Surgery Contact information: [id number] [place] [id number] [phone]          Discharge Diagnoses:  1. Acute cholecystitis   2. Escherichia coli bacteremia/sepsis  3. Marked leukocytosis  4. Acute kidney injury  5. Anemia of critical illness  6. Atrial fibrillation.  7. Bilateral upper extremity edema right greater than left, bilateral LE edema   8. Parkinson's disease. 9. Dysphagia   Discharge Condition: improved Disposition: SNF for short term rehab  Diet recommendation:  Diet recommendations: Dysphagia 3 (mechanical soft);Thin liquid Liquids provided via: Cup;Straw Medication Administration: Whole meds with liquid Supervision: Patient able to self feed;Intermittent supervision to cue for compensatory strategies Compensations: Slow rate;Follow solids with liquid Postural Changes and/or  Swallow Maneuvers: Seated upright 90 degrees;Upright 30-60 min after meal  Filed Weights   05/27/16 0500 05/29/16 0452 05/30/16 0500  Weight: 108.1 kg (238 lb 5.1 oz) 118 kg (260 lb 2.3 oz) 106.6 kg (235 lb 0.2 oz)    History of present illness:  82 year-old man presented with abdominal pain. Imaging consistent with cholecystitis.   Hospital Course:  Treated with empiric abx, seen by surgery, underwent successful removal of grangrenous gallbladder. Hospitalization complicated by bacteremia and gallbladder fossa fluid collection post-op necessitating drain placement and peripheral edema. Improved with empiric abx and Diflucan. Drain and staples now removed and cleared for discharge by general surgery. Individual issues as below.  1. Acute cholecystitis, status post cholecystectomy with removal of gangrenous gallbladder 11/17. Subsequent imaging suggested collection of fluid in the gallbladder fossa, underwent drain placement, culture data was negative and drain removed on discharge.  2. Escherichia coli bacteremia/sepsis, secondary to above. Repeat blood cultures no growth, final. No fever. On oral abx. 3. Marked leukocytosis. Likely secondary to acute illness, lagging behind clinical improvement, no evidence of ongoing infection. 4. Acute kidney injury. Resolved. Secondary to sepsis. 5. Anemia of critical illness. Status post transfusion 1 unit packed red blood cells 11/21. Remains stable. 6. Atrial fibrillation. In sinus rhythm. No anticoagulation given history of fall risk. 7. Bilateral upper extremity edema right greater than left, bilateral LE edema. Venous Doppler negative for DVT.   8. Parkinson's disease. 9. Dysphagia. Continue dysphagia 3 diet per speech therapy.  Consultants:  General surgery  Cardiology  Gastroenterology  ST:  Diet recommendations: Dysphagia 3 (mechanical soft);Thin liquid Liquids provided via: Cup;Straw Medication Administration: Whole meds with  liquid Supervision: Patient able to self feed;Intermittent supervision to cue for compensatory strategies Compensations: Slow rate;Follow solids with liquid Postural  Changes and/or Swallow Maneuvers: Seated upright 90 degrees;Upright 30-60 min after meal  Procedures:  Transfused 1 unit PRBC 11/21  Echo - Left ventricle: The cavity size was normal. Wall thickness was increased in a pattern of mild LVH. Systolic function was normal. The estimated ejection fraction was in the range of 60% to 65%. Wall motion was normal; there were no regional wall motion abnormalities. Doppler parameters are consistent with abnormal left ventricular relaxation (grade 1 diastolic dysfunction). - Aortic valve: Mildly calcified annulus. Trileaflet; mildly calcified leaflets. There was mild regurgitation. - Mitral valve: Calcified annulus. Mildly calcified leaflets . There was trivial regurgitation. - Right atrium: Central venous pressure (est): 3 mm Hg. - Tricuspid valve: There was trivial regurgitation. - Pulmonary arteries: PA peak pressure: 25 mm Hg (S). - Pericardium, extracardiac: There was no pericardial effusion.   Antimicrobials:  Ceftin 11/25 >> 11/29  Augmentin 11/27 >> 11/29  Diflucan 11/20 >> 12/3   Vancomycin 11/14 >> 11/15, 11/20 >> 11/25  Zosyn 11/14 >> 11/16, 11/20 >> 11/25  Rocephin 11/16 >> 11/20  Discharge Instructions     Medication List    TAKE these medications   amoxicillin-clavulanate 875-125 MG tablet Commonly known as:  AUGMENTIN Take 1 tablet by mouth every 12 (twelve) hours. Last day 11/29.   aspirin 81 MG chewable tablet Chew 81 mg by mouth daily.   atorvastatin 40 MG tablet Commonly known as:  LIPITOR Take 40 mg by mouth at bedtime.   carbidopa-levodopa 25-100 MG tablet Commonly known as:  SINEMET IR Take 1 tablet by mouth 3 (three) times daily.   doxylamine (Sleep) 25 MG tablet Commonly known as:  UNISOM Take 25 mg by mouth at  bedtime.   fluconazole 200 MG tablet Commonly known as:  DIFLUCAN Take 1 tablet (200 mg total) by mouth daily. Last dose 12/3.   furosemide 40 MG tablet Commonly known as:  LASIX Take 1 tablet (40 mg total) by mouth daily.   metoprolol tartrate 25 MG tablet Commonly known as:  LOPRESSOR Take 0.5 tablets (12.5 mg total) by mouth 2 (two) times daily. What changed:  medication strength  how much to take   omeprazole 40 MG capsule Commonly known as:  PRILOSEC Take 40 mg by mouth daily.   ramipril 2.5 MG capsule Commonly known as:  ALTACE Take 2.5 mg by mouth daily.      No Known Allergies  The results of significant diagnostics from this hospitalization (including imaging, microbiology, ancillary and laboratory) are listed below for reference.    Significant Diagnostic Studies: Ct Abdomen Pelvis Wo Contrast  Result Date: 05/21/2016 CLINICAL DATA:  Acute onset of epigastric abdominal pain. Sinus tachycardia and fever. Renal insufficiency. Initial encounter. EXAM: CT ABDOMEN AND PELVIS WITHOUT CONTRAST TECHNIQUE: Multidetector CT imaging of the abdomen and pelvis was performed following the standard protocol without IV contrast. COMPARISON:  None. FINDINGS: Lower chest: Minimal bibasilar atelectasis is noted. Diffuse coronary artery calcifications are seen. The patient is status post median sternotomy. Hepatobiliary: Diffuse soft tissue inflammation and wall thickening is noted about the gallbladder, with trace associated pericholecystic fluid, compatible with acute cholecystitis. A stone is noted within the gallbladder. The common bile duct remains normal in caliber. A nonspecific 1.4 cm hypodensity is noted at the left hepatic lobe. The liver is otherwise unremarkable. Pancreas: The pancreas is within normal limits. Spleen: The spleen is unremarkable in appearance. Adrenals/Urinary Tract: The adrenal glands are unremarkable in appearance. The kidneys are within normal limits. There  is no evidence  of hydronephrosis. No renal or ureteral stones are identified. No perinephric stranding is seen. Nonspecific perinephric stranding is noted bilaterally. Mild bilateral renal atrophy is seen. A small right renal cyst is noted. There is no evidence of hydronephrosis. No renal or ureteral stones are identified. Stomach/Bowel: The stomach is unremarkable in appearance. The small bowel is within normal limits. The appendix is normal in caliber, without evidence of appendicitis. Minimal diverticulosis is noted along the proximal sigmoid colon, without evidence of diverticulitis. Vascular/Lymphatic: Scattered calcification is seen along the abdominal aorta and its branches. The abdominal aorta is otherwise grossly unremarkable. The inferior vena cava is grossly unremarkable. No retroperitoneal lymphadenopathy is seen. No pelvic sidewall lymphadenopathy is identified. Reproductive: A focal diverticulum is noted arising at the dome of the bladder. The bladder is otherwise unremarkable. The prostate remains normal in size. Other: No additional soft tissue abnormalities are seen. Musculoskeletal: No acute osseous abnormalities are identified. Mild vacuum phenomenon is noted at L4-L5. The visualized musculature is unremarkable in appearance. IMPRESSION: 1. Diffuse soft tissue inflammation and wall thickening at the gallbladder, with trace associated pericholecystic fluid, compatible with acute cholecystitis. Underlying cholelithiasis noted. 2. **An incidental finding of potential clinical significance has been found. Nonspecific 1.4 cm hypodensity at the left hepatic lobe. Would correlate with LFTs after completion of treatment for cholecystitis.** 3. Mild bilateral renal atrophy.  Small right renal cyst noted. 4. Minimal diverticulosis along the proximal sigmoid colon, without evidence of diverticulitis. 5. Scattered aortic atherosclerosis. 6. Focal bladder diverticulum noted. Bladder otherwise grossly  unremarkable. Electronically Signed   By: Garald Balding M.D.   On: 05/21/2016 03:45   US Abdomen Complete  Result Date: 05/21/2016 CLINICAL DATA:  Abnormal CT.  Question cholecystitis. EXAM: ABDOMEN ULTRASOUND COMPLETE COMPARISON:  CT of earlier today FINDINGS: Gallbladder: Gallbladder sludge. Suspect a 12 mm stone. Gallbladder wall thickening at up to 11 mm. Small volume pericholecystic fluid. Sonographic Murphy's sign was not elicited. Common bile duct: Diameter: Upper normal for age, 8 mm. No intrahepatic ductal dilatation. Liver: Left hepatic lobe hyperechoic lesion is well-circumscribed and measures 1.1 cm. IVC: No abnormality visualized. Pancreas: Obscured by bowel gas. Spleen: Size and appearance within normal limits. Right Kidney: Length: 13.0 cm. No hydronephrosis. A lower pole right renal 4.8 cm lesion appears minimally complex, but is fluid density on the CT. An interpolar central right renal 1.5 cm hypoechoic lesion likely corresponds to a fluid density lesion on image 42 of today's CT. Left Kidney: Length: 11.4 cm. Echogenicity within normal limits. No mass or hydronephrosis visualized. Abdominal aorta: No aneurysm visualized. Other findings: No ascites. IMPRESSION: 1. Gallbladder sludge with possible stone. Wall thickening and pericholecystic fluid are suspicious for acute cholecystitis. No sonographic Murphy sign to confirm acute cholecystitis. 2. Upper normal common duct size.  Correlate with bilirubin levels. 3. Right renal lesions are likely cysts given CT appearance. Recommend attention on follow-up. 4. Hyperechoic left hepatic lobe lesion is favored to represent a hemangioma. Technically indeterminate. Consider follow-up with ultrasound at 6 months to confirm size stability. Alternatively, outpatient pre and post contrast abdominal MRI could confirm a benign hemangioma. Electronically Signed   By: Abigail Miyamoto M.D.   On: 05/21/2016 09:02   Ct Abdomen Pelvis W Contrast  Result Date:  05/27/2016 CLINICAL DATA:  Leukocytosis. Status post laparoscopic cholecystectomy November 17th for gangrenous cholecystitis, subsequent E coli sepsis. EXAM: CT ABDOMEN AND PELVIS WITH CONTRAST TECHNIQUE: Multidetector CT imaging of the abdomen and pelvis was performed using the standard protocol following bolus administration  of intravenous contrast. CONTRAST:  155mL ISOVUE-300 IOPAMIDOL (ISOVUE-300) INJECTION 61% COMPARISON:  None. FINDINGS: LOWER CHEST: Small bilateral pleural effusions. Enhancing bibasilar atelectasis. Heart size is normal. Severe coronary artery calcifications and/or stents. No pericardial effusions. Status post median sternotomy. Fluid in the thoracic esophagus. HEPATOBILIARY: 15 mm nonenhancing low-density cyst LEFT lobe of the liver, sub cm probable cyst RIGHT lobe of the liver. Mild biliary dilatation isolated to segment. Patent main portal vein. Heterogeneous contents and gas within the gallbladder fossa. Status post cholecystectomy. No extrahepatic biliary dilatation or cholelithiasis. PANCREAS: Normal. SPLEEN: Normal. ADRENALS/URINARY TRACT: Kidneys are orthotopic, demonstrating symmetric enhancement. No nephrolithiasis, hydronephrosis or solid renal masses. Multiple RIGHT benign appearing renal cysts measure up to 4.6 cm. Delayed imaging through the kidneys demonstrates symmetric prompt contrast excretion within the proximal urinary collecting system. Urinary bladder is well distended containing a Foley catheter in retaining bulb. Bladder fundal diverticulum with wall thickening and inflammation. Normal adrenal glands. STOMACH/BOWEL: The stomach, small and large bowel are normal in course and caliber without inflammatory changes. Mild sigmoid diverticulosis. Fluid-filled distended small bowel LEFT upper quadrant up to 4.4 cm. 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 No lymphadenopathy by CT size criteria.  REPRODUCTIVE: Normal. OTHER: Small a moderate amount of intermediate density (20 Hounsfield units) free fluid. Focal small amount of increased density free fluid anterior to the liver with gas. MUSCULOSKELETAL: Nonacute. Small fat containing umbilical hernia. Small fat containing RIGHT umbilical hernia with subcutaneous gas. Moderate L5-S1 degenerative disc. Mild facet arthropathy. IMPRESSION: Status post cholecystectomy with gallbladder fossa hematoma, possible Surgicel. Mild intrahepatic biliary dilatation isolated cyst segment 5. Small to moderate amount of ascites, small amount of perihepatic hemo peritoneum and gas consistent with recent surgery. LEFT upper quadrant small bowel ileus. Urinary bladder wall thickening and inflammation concerning for cystitis, predominately involving fundal diverticulum. Electronically Signed   By: Elon Alas M.D.   On: 05/27/2016 17:08   US Venous Img Upper Uni Right  Result Date: 05/29/2016 CLINICAL DATA:  Right upper extremity edema for 3 days EXAM: RIGHT UPPER EXTREMITY VENOUS DOPPLER ULTRASOUND TECHNIQUE: Gray-scale sonography with graded compression, as well as color Doppler and duplex ultrasound were performed to evaluate the upper extremity deep venous system from the level of the subclavian vein and including the jugular, axillary, basilic, radial, ulnar and upper cephalic vein. Spectral Doppler was utilized to evaluate flow at rest and with distal augmentation maneuvers. COMPARISON:  None. FINDINGS: Contralateral Subclavian Vein: Respiratory phasicity is normal and symmetric with the symptomatic side. No evidence of thrombus. Normal compressibility. Internal Jugular Vein: No evidence of thrombus. Normal compressibility, respiratory phasicity and response to augmentation. Subclavian Vein: No evidence of thrombus. Normal compressibility, respiratory phasicity and response to augmentation. Axillary Vein: No evidence of thrombus. Normal compressibility,  respiratory phasicity and response to augmentation. Cephalic Vein: No evidence of thrombus. Normal compressibility, respiratory phasicity and response to augmentation. Basilic Vein: No evidence of thrombus. Normal compressibility, respiratory phasicity and response to augmentation. Brachial Veins: No evidence of thrombus. Normal compressibility, respiratory phasicity and response to augmentation. Radial Veins: No evidence of thrombus. Normal compressibility, respiratory phasicity and response to augmentation. Ulnar Veins: No evidence of thrombus. Normal compressibility, respiratory phasicity and response to augmentation. Venous Reflux:  None visualized. Other Findings:  None visualized. IMPRESSION: No evidence of deep venous thrombosis. Electronically Signed   By: Jerilynn Mages.  Shick M.D.   On: 05/29/2016 14:25   Dg Chest Port 1 View  Result Date: 05/27/2016 CLINICAL DATA:  Leukocytosis EXAM: PORTABLE CHEST 1 VIEW COMPARISON:  05/21/2016 FINDINGS: There is no focal parenchymal opacity. There is no pleural effusion or pneumothorax. The heart and mediastinal contours are unremarkable. There is evidence of prior CABG. The osseous structures are unremarkable. IMPRESSION: No active disease. Electronically Signed   By: Kathreen Devoid   On: 05/27/2016 12:01   Dg Chest Port 1 View  Result Date: 05/21/2016 CLINICAL DATA:  Epigastric pain.  Febrile. EXAM: PORTABLE CHEST 1 VIEW COMPARISON:  11/26/2011 FINDINGS: A single AP portable view of the chest demonstrates no focal airspace consolidation or alveolar edema. The lungs are grossly clear. There is no large effusion or pneumothorax. Cardiac and mediastinal contours appear unremarkable. IMPRESSION: No active disease. Electronically Signed   By: Andreas Newport M.D.   On: 05/21/2016 01:46   Ct Image Guided Fluid Drain By Catheter  Result Date: 05/28/2016 INDICATION: Gallbladder fossa abscess EXAM: CT IMAGE GUIDED FLUID DRAIN BY CATHETER MEDICATIONS: The patient is  currently admitted to the hospital and receiving intravenous antibiotics. The antibiotics were administered within an appropriate time frame prior to the initiation of the procedure. ANESTHESIA/SEDATION: None. COMPLICATIONS: None immediate. PROCEDURE: Informed written consent was obtained from the patient after a thorough discussion of the procedural risks, benefits and alternatives. All questions were addressed. Maximal Sterile Barrier Technique was utilized including caps, mask, sterile gowns, sterile gloves, sterile drape, hand hygiene and skin antiseptic. A timeout was performed prior to the initiation of the procedure. In the supine position, the right flank was prepped and draped. 1% lidocaine was utilized for local anesthesia. Under CT guidance, an 18 gauge needle was inserted into the gallbladder fossa fluid collection via transhepatic approach. It was removed over an Amplatz wire. A 10 French dilator followed by a 10 [place] drain were inserted over the wire and coiled in the gallbladder fossa fluid collection. Dark reddish fluid was aspirated. It was looped and string fixed then sewn to the skin. FINDINGS: Images document placement of a 10 French drain in a gallbladder fossa fluid collection. IMPRESSION: Successful gallbladder fossa fluid collection 10 French drain placement. Electronically Signed   By: Marybelle Killings M.D.   On: 05/28/2016 16:12    Microbiology: Recent Results (from the past 240 hour(s))  MRSA PCR Screening     Status: None   Collection Time: 05/24/16  8:32 PM  Result Value Ref Range Status   MRSA by PCR NEGATIVE NEGATIVE Final    Comment:        The GeneXpert MRSA Assay (FDA approved for NASAL specimens only), is one component of a comprehensive MRSA colonization surveillance program. It is not intended to diagnose MRSA infection nor to guide or monitor treatment for MRSA infections.   Culture, blood (routine x 2)     Status: None   Collection Time: 05/27/16 10:08 AM   Result Value Ref Range Status   Specimen Description BLOOD RIGHT THUMB  Final   Special Requests BOTTLES DRAWN AEROBIC ONLY 4CC  Final   Culture NO GROWTH 5 DAYS  Final   Report Status 06/01/2016 FINAL  Final  Culture, blood (routine x 2)     Status: None   Collection Time: 05/27/16 10:25 AM  Result Value Ref Range Status   Specimen Description BLOOD LEFT THUMB  Final   Special Requests BOTTLES DRAWN AEROBIC AND ANAEROBIC 4CC EACH  Final   Culture NO GROWTH 5 DAYS  Final   Report Status 06/01/2016 FINAL  Final  C difficile quick scan w PCR reflex  Status: None   Collection Time: 05/28/16  1:15 AM  Result Value Ref Range Status   C Diff antigen NEGATIVE NEGATIVE Final   C Diff toxin NEGATIVE NEGATIVE Final   C Diff interpretation No C. difficile detected.  Final  Body fluid culture     Status: None   Collection Time: 05/28/16  2:31 PM  Result Value Ref Range Status   Specimen Description FLUID ABDOMEN GALL BLADDER  Final   Special Requests NONE  Final   Gram Stain   Final    ABUNDANT WBC PRESENT, PREDOMINANTLY PMN NO ORGANISMS SEEN    Culture NO GROWTH 3 DAYS  Final   Report Status 06/01/2016 FINAL  Final     Labs: Basic Metabolic Panel:  Recent Labs Lab 05/28/16 0420 05/29/16 0422 05/31/16 0632 06/02/16 0714  NA 137 139 136 135  K 4.4 3.7 3.5 3.2*  CL 105 106 105 102  CO2 27 27 26 28   GLUCOSE 103* 93 97 97  BUN 44* 38* 20 17  CREATININE 1.37* 1.29* 1.11 1.03  CALCIUM 7.8* 7.5* 7.7* 7.6*   Liver Function Tests:  Recent Labs Lab 05/28/16 0420  AST 45*  ALT 32  ALKPHOS 201*  BILITOT 1.2  PROT 5.6*  ALBUMIN 2.1*   CBC:  Recent Labs Lab 05/28/16 0420 05/29/16 0422 05/30/16 0455 06/01/16 0549  WBC 32.1* 19.6* 23.8* 21.8*  NEUTROABS 28.3*  --   --   --   HGB 7.5* 7.3* 7.6* 7.5*  HCT 24.8* 24.2* 24.1* 23.9*  MCV 78.0 78.1 77.5* 79.4  PLT 680* 567* 543* 502*     CBG:  Recent Labs Lab 05/30/16 0729  GLUCAP 102*    Principal Problem:    Acute cholecystitis Active Problems:   HTN (hypertension)   CAD (coronary artery disease)   Atrial fibrillation (HCC)   Elevated LFTs   AKI (acute kidney injury) ([place])   E coli bacteremia   Parkinson's disease ([place])   Time coordinating discharge: 35 minutes  Signed:  Murray Hodgkins, MD [facility] 06/03/2016, 1:13 PM

## 2016-06-03 NOTE — Progress Notes (Signed)
Report called to [employer] at [facility] in [place], [place]. Ambulance called about transport. Awaiting that now.

## 2016-06-03 NOTE — Clinical Social Work Placement (Signed)
   CLINICAL SOCIAL WORK PLACEMENT  NOTE  Date:  06/03/2016  Patient Details  Name: Jesus Maldonado MRN: [id number] Date of Birth: 07-22-1930  Clinical Social Work is seeking post-discharge placement for this patient at the [place] level of care (*CSW will initial, date and re-position this form in  chart as items are completed):  Yes   Patient/family provided with [place] Work Department's list of facilities offering this level of care within the geographic area requested by the patient (or if unable, by the patient's family).  Yes   Patient/family informed of their freedom to choose among providers that offer the needed level of care, that participate in Medicare, Medicaid or managed care program needed by the patient, have an available bed and are willing to accept the patient.  Yes   Patient/family informed of [place] [facility]'s ownership interest in [facility] and [facility], as well as of the fact that they are under no obligation to receive care at these facilities.  PASRR submitted to EDS on 06/03/16     PASRR number received on 06/03/16     Existing PASRR number confirmed on       FL2 transmitted to all facilities in geographic area requested by pt/family on 06/03/16     FL2 transmitted to all facilities within larger geographic area on       Patient informed that his/her managed care company has contracts with or will negotiate with certain facilities, including the following:        Yes   Patient/family informed of bed offers received.  [facility] bed [facility]     Physician recommends and patient chooses bed at [facility]    Patient to be transferred to [facility] on 06/03/16.  Patient to be transferred to facility by EMS     Patient family notified on 06/03/16 of transfer.  Name of family member notified:  [name]: Daughter     PHYSICIAN Please sign  FL2     Additional Comment:    _______________________________________________ Lilly C[place], LCSW 06/03/2016, 2:38 PM

## 2016-06-03 NOTE — Progress Notes (Signed)
PROGRESS NOTE  WARRIOR GAERTNER [id number] DOB: 07-May-1931 DOA: 05/21/2016 PCP: Kendrick Ranch, MD  Brief Narrative: 82 year-old man presented with abdominal pain. Imaging consistent with cholecystitis. Developed bacteremia. Underwent upper scopic cholecystectomy with removal of gangrenous gallbladder 11/17. Thereafter developed marked leukocytosis and follow-up CT revealed a collection of fluid in the gallbladder fossa. Underwent drain placement. Cultures pending.   Assessment/Plan: 1. Acute cholecystitis, status post cholecystectomy with removal of gangrenous gallbladder 11/17. Subsequent imaging suggested collection of fluid in the gallbladder fossa, underwent drain placement.  2. Escherichia coli bacteremia/sepsis, secondary to above. Repeat blood cultures no growth, final. No fever. On oral abx. 3. Marked leukocytosis. Likely secondary to acute illness, lagging behind clinical improvement, no evidence of ongoing infection. 4. Acute kidney injury. Resolved. Secondary to sepsis. 5. Anemia of critical illness. Status post transfusion 1 unit packed red blood cells 11/21. Remains stable. 6. Atrial fibrillation. In sinus rhythm. No anticoagulation given history of fall risk. 7. Bilateral upper extremity edema right greater than left, bilateral LE edema. Venous Doppler negative for DVT.   8. Parkinson's disease. 9. Dysphagia. Continue dysphagia 3 diet per speech therapy.    Continues to improve. Infection appears resolved. Discussed with Dr. Arnoldo Morale, stable for discharge, recommends completing course of Diflucan and abx as an outpatient. Drain and staples removed by Dr. Arnoldo Morale.  Edema is improving, expect will reach euvolemia with oral therapy.  F/u SLP services at SNF for diet tolerance and upgrades to regular textures as appropriate. Pt OK for po meds whole with water.   Discharge to SNF today. Discussed with daughter at bedside.  Murray Hodgkins, MD  [facility] Direct contact: [phone] --Via amion app OR  --[email or web address]; password [email or web address]  7PM-7AM contact night coverage as above 06/03/2016, 12:38 PM  LOS: 13 days   Consultants:  General surgery  Cardiology  Gastroenterology  ST:  Diet recommendations: Dysphagia 3 (mechanical soft);Thin liquid Liquids provided via: Cup;Straw Medication Administration: Whole meds with liquid Supervision: Patient able to self feed;Intermittent supervision to cue for compensatory strategies Compensations: Slow rate;Follow solids with liquid Postural Changes and/or Swallow Maneuvers: Seated upright 90 degrees;Upright 30-60 min after meal  Procedures:  Transfused 1 unit PRBC 11/21  Echo - Left ventricle: The cavity size was normal. Wall thickness was increased in a pattern of mild LVH. Systolic function was normal. The estimated ejection fraction was in the range of 60% to 65%. Wall motion was normal; there were no regional wall motion abnormalities. Doppler parameters are consistent with abnormal left ventricular relaxation (grade 1 diastolic dysfunction). - Aortic valve: Mildly calcified annulus. Trileaflet; mildly calcified leaflets. There was mild regurgitation. - Mitral valve: Calcified annulus. Mildly calcified leaflets . There was trivial regurgitation. - Right atrium: Central venous pressure (est): 3 mm Hg. - Tricuspid valve: There was trivial regurgitation. - Pulmonary arteries: PA peak pressure: 25 mm Hg (S). - Pericardium, extracardiac: There was no pericardial effusion.   Antimicrobials:  Ceftin 11/25 >> 11/29  Augmentin 11/27 >> 11/29  Diflucan 11/20 >> 12/3   Vancomycin 11/14 >> 11/15, 11/20 >> 11/25  Zosyn 11/14 >> 11/16, 11/20 >> 11/25  Rocephin 11/16 >> 11/20  Interval history/Subjective: Feeling fine, no complaints. Breathing fine. No pain.  Objective: Vitals:   06/02/16 1528 06/02/16 2058 06/03/16 0618 06/03/16 0800  BP: (!) 121/51 (!)  120/57 (!) 115/58 (!) 120/57  Pulse: 98 99 92 97  Resp: 20 18 20 20   Temp: 98.1 F (36.7 C) 97.6 F (36.4 C) 98.1 F (36.7 C) 98.1  F (36.7 C)  TempSrc: Oral Oral Oral Oral  SpO2: 93% 92% 94% 94%  Weight:      Height:        Intake/Output Summary (Last 24 hours) at 06/03/16 1238 Last data filed at 06/03/16 0900  Gross per 24 hour  Intake              133 ml  Output              875 ml  Net             -742 ml     Filed Weights   05/27/16 0500 05/29/16 0452 05/30/16 0500  Weight: 108.1 kg (238 lb 5.1 oz) 118 kg (260 lb 2.3 oz) 106.6 kg (235 lb 0.2 oz)    Exam: Constitutional:   Appears calm, comfortable Respiratory:   CTA bilaterally, no w/r/r. Normal respiratory effort. Cardiovascular:   RRR no m/r/g.   2+ bilateral pedal edema  Decreasing right > left arm edema Abdomen:   soft Psychiatric:   Alert    I have personally reviewed following labs and imaging studies:     Scheduled Meds: . atorvastatin  40 mg Oral QHS  . carbidopa-levodopa  1 tablet Oral TID  . cefUROXime  500 mg Oral BID WC  . chlorhexidine  15 mL Mouth Rinse BID  . furosemide  20 mg Intravenous Daily  . magic mouthwash  15 mL Oral QID   And  . lidocaine  15 mL Mouth/Throat QID  . mouth rinse  15 mL Mouth Rinse q12n4p  . metoprolol  12.5 mg Oral BID  . pantoprazole  40 mg Oral Daily  . sodium chloride flush  3 mL Intravenous Q12H   Continuous Infusions:  Principal Problem:   Acute cholecystitis Active Problems:   HTN (hypertension)   CAD (coronary artery disease)   Atrial fibrillation (HCC)   Elevated LFTs   AKI (acute kidney injury) ([place])   E coli bacteremia   Parkinson's disease ([place])   LOS: 13 days

## 2016-06-03 NOTE — Progress Notes (Signed)
IV removed, WNL. D/C paperwork and transfer paperwork given to EMS staff. Pt is transporting to [facility] now.

## 2016-06-03 NOTE — Progress Notes (Addendum)
2:29 PM Insurance authorization has been obtained and bed accepted at [facility]. Patient will DC today by EMS and daughter in room and in agreement.  LCSW will complete discharge paperwork and EMS.  Insurance:  [id number]  3 days 11/27- 05/26/16 RUG:  RUV 572 minutes of therapy   Lane Hacker, MSW Clinical Social Work: [employer] Coverage for :    LCSW spoke with daughter at bedside. She is aware he is medically stable and our barriers remain insurance.  LCSW spoke with insurance and they are actively working on his referral and authorization. LCSW called [facility] as this is daughter's first choice and they are actively working to review the referral with potential bed offer. Will continue to follow in hopes of a discharge today pending auth.     Patient is medically stable for discharge and although he was refusing SNF, he is now agreeable after discussion. He reports he only wants short term SNF in rehab and would like his daughter to be contacted regarding placement.    Patient requires insurance authorization for placement as his insurance is [employer]  LCSW will begin process for insurance, but due to patient being medically stable, LCSW will most likely have to initiate a LOG and place patient at facility who will accept.    Sister made aware and LCSW actively working on SNF.  Lane Hacker, MSW Clinical Social Work: [employer] Coverage for :  [phone]

## 2016-06-03 NOTE — Care Management Important Message (Signed)
Important Message  Patient Details  Name: Jesus Maldonado MRN: [id number] Date of Birth: December 11, 1930   Medicare Important Message Given:  Yes    Sherald Barge, RN 06/03/2016, 11:52 AM

## 2016-06-03 NOTE — NC FL2 (Signed)
[place] LEVEL OF CARE SCREENING TOOL     IDENTIFICATION  Patient Name: Jesus Maldonado Birthdate: 08/06/1930 Sex: male Admission Date (Current Location): 05/21/2016  [facility] and [place] Number:  [employer] and Address:  [place] [address], [place]      Provider Number: [id number]  Attending Physician Name and Address:  Samuella Cota, MD  Relative Name and Phone Number:       Current Level of Care: Hospital Recommended Level of Care: [place] Prior Approval Number:    Date Approved/Denied:   PASRR Number:   [id number]  Discharge Plan: SNF    Current Diagnoses: Patient Active Problem List   Diagnosis Date Noted  . Parkinson's disease ([place]) 05/26/2016  . E coli bacteremia 05/22/2016  . Sepsis ([place])   . Acute cholecystitis 05/21/2016  . Cholelithiasis 05/21/2016  . Elevated LFTs 05/21/2016  . AKI (acute kidney injury) ([place]) 05/21/2016  . Hypokalemia 05/21/2016  . Lower extremity edema 02/11/2012  . CAD (coronary artery disease) 12/09/2011  . Atrial fibrillation ([place]) 12/09/2011  . HTN (hypertension) 10/26/2011  . Hyperlipidemia with target low density lipoprotein (LDL) cholesterol less than 70 mg/dL 10/26/2011  . NSTEMI (non-ST elevated myocardial infarction) ([place]) 10/25/2011    Orientation RESPIRATION BLADDER Height & Weight     Self, Time, Situation, Place  Normal Incontinent Weight: 235 lb 0.2 oz (106.6 kg) Height:  5\' 10"  (177.8 cm)  BEHAVIORAL SYMPTOMS/MOOD NEUROLOGICAL BOWEL NUTRITION STATUS   calm/cooperative  stable Incontinent Diet ( Thin liquid;Dysphagia 3 (mechanical soft))  Liquids provided via: Cup;Straw Medication Administration: Whole meds with puree (Pt expressed this method 05/30/16 to SLP) Supervision: Patient able to self feed;Intermittent supervision to cue for compensatory strategies Compensations: Slow rate;Follow solids with liquid Postural Changes and/or  Swallow Maneuvers: Seated upright 90 degrees;Upright 30-60 min after meal (ideally out of bed for po)  AMBULATORY STATUS COMMUNICATION OF NEEDS Skin   Extensive Assist Verbally Surgical wounds                       Personal Care Assistance Level of Assistance  Bathing, Feeding, Dressing Bathing Assistance: Limited assistance Feeding assistance: Independent Dressing Assistance: Limited assistance     Functional Limitations Info  Sight, Hearing, Speech Sight Info: Adequate Hearing Info: Impaired Speech Info: Adequate    SPECIAL CARE FACTORS FREQUENCY  PT (By licensed PT), OT (By licensed OT), Speech therapy     PT Frequency: 5x OT Frequency: 5x     Speech Therapy Frequency: 3x      Contractures Contractures Info: Not present    Additional Factors Info  Code Status, Allergies Code Status Info: Full Code Allergies Info: NKA           Current Medications (06/03/2016):  This is the current hospital active medication list Current Facility-Administered Medications  Medication Dose Route Frequency Provider Last Rate Last Dose  . acetaminophen (TYLENOL) suppository 650 mg  650 mg Rectal Q6H PRN Merryl Hacker, MD   650 mg at 05/21/16 0212  . atorvastatin (LIPITOR) tablet 40 mg  40 mg Oral QHS Orvan Falconer, MD   40 mg at 06/02/16 2337  . carbidopa-levodopa (SINEMET IR) 10-100 MG per tablet immediate release 1 tablet  1 tablet Oral TID Orvan Falconer, MD   1 tablet at 06/03/16 0850  . cefUROXime (CEFTIN) tablet 500 mg  500 mg Oral BID WC Samuella Cota, MD   500 mg at  06/03/16 0851  . chlorhexidine (PERIDEX) 0.12 % solution 15 mL  15 mL Mouth Rinse BID Rexene Alberts, MD   15 mL at 06/03/16 0851  . diphenhydrAMINE (BENADRYL) capsule 25 mg  25 mg Oral Q6H PRN Samuella Cota, MD   25 mg at 06/01/16 1114  . furosemide (LASIX) injection 20 mg  20 mg Intravenous Daily Kathie Dike, MD   20 mg at 06/03/16 0852  . guaiFENesin (ROBITUSSIN) 100 MG/5ML solution 100 mg  5 mL Oral Q4H  PRN Samuella Cota, MD   100 mg at 06/02/16 1206  . magic mouthwash  15 mL Oral QID Kathie Dike, MD   15 mL at 06/03/16 0851   And  . lidocaine (XYLOCAINE) 2 % viscous mouth solution 15 mL  15 mL Mouth/Throat QID Kathie Dike, MD   15 mL at 06/03/16 0852  . LORazepam (ATIVAN) injection 0.5 mg  0.5 mg Intravenous Q4H PRN Aviva Signs, MD   0.5 mg at 05/28/16 0158  . magnesium hydroxide (MILK OF MAGNESIA) suspension 30 mL  30 mL Oral TID WC PRN Aviva Signs, MD      . MEDLINE mouth rinse  15 mL Mouth Rinse q12n4p Rexene Alberts, MD   15 mL at 06/02/16 1600  . metoprolol tartrate (LOPRESSOR) tablet 12.5 mg  12.5 mg Oral BID Rexene Alberts, MD   12.5 mg at 06/03/16 0852  . ondansetron (ZOFRAN) tablet 4 mg  4 mg Oral Q6H PRN Orvan Falconer, MD       Or  . ondansetron [facility]) injection 4 mg  4 mg Intravenous Q6H PRN Orvan Falconer, MD   4 mg at 05/26/16 0726  . pantoprazole (PROTONIX) EC tablet 40 mg  40 mg Oral Daily Orvan Falconer, MD   40 mg at 06/03/16 0851  . simethicone (MYLICON) chewable tablet 40 mg  40 mg Oral Q6H PRN Aviva Signs, MD   40 mg at 05/25/16 1845  . sodium chloride flush (NS) 0.9 % injection 3 mL  3 mL Intravenous Q12H Orvan Falconer, MD   3 mL at 06/03/16 1000     Discharge Medications: Please see discharge summary for a list of discharge medications.  Relevant Imaging Results:  Relevant Lab Results:   Additional Information SSN:  [id number]  [place], [place]

## 2016-06-03 NOTE — Care Management Note (Signed)
Case Management Note  Patient Details  Name: [name] MRN: [id number] Date of Birth: [birth year]  Expected Discharge Date:    06/03/2016             Expected Discharge Plan:  [place]  In-House Referral:  Clinical Social Work  Discharge planning Services  CM Consult  Post Acute Care Choice:  NA Choice offered to:  NA  Status of Service:  Completed, signed off   Additional Comments: Pt is now agreeable to SNF. CSW is aware and making arrangements for SNF placement. No further CM needs anticipate.   Sherald Barge, RN 06/03/2016, 2:13 PM

## 2016-06-03 NOTE — Progress Notes (Signed)
10 Days Post-Op  Subjective: Patient has no complaints.  Objective: Vital signs in last 24 hours: Temp:  [97.6 F (36.4 C)-98.1 F (36.7 C)] 98.1 F (36.7 C) (11/27 0800) Pulse Rate:  [92-99] 97 (11/27 0800) Resp:  [18-20] 20 (11/27 0800) BP: (115-121)/(51-58) 120/57 (11/27 0800) SpO2:  [92 %-94 %] 94 % (11/27 0800) Last BM Date: 06/02/16  Intake/Output from previous day: 11/26 0701 - 11/27 0700 In: 31 [P.O.:480; I.V.:3] Out: 835 [Urine:800; Drains:25; Blood:10] Intake/Output this shift: Total I/O In: -  Out: 50 [Urine:50]  General appearance: alert, cooperative and no distress GI: Soft, incisions healing well. Bowel drainage minimal, nonbilious. Drain removed. Staples removed, Steri-Strips applied.  Lab Results:   Recent Labs  06/01/16 0549  WBC 21.8*  HGB 7.5*  HCT 23.9*  PLT 502*   BMET  Recent Labs  06/02/16 0714  NA 135  K 3.2*  CL 102  CO2 28  GLUCOSE 97  BUN 17  CREATININE 1.03  CALCIUM 7.6*   PT/INR No results for input(s): LABPROT, INR in the last 72 hours.  Studies/Results: No results found.  Anti-infectives: Anti-infectives    Start     Dose/Rate Route Frequency Ordered Stop   06/01/16 1700  cefUROXime (CEFTIN) tablet 500 mg     500 mg Oral 2 times daily with meals 06/01/16 1514     05/31/16 2200  vancomycin (VANCOCIN) IVPB 750 mg/150 ml premix  Status:  Discontinued     750 mg 150 mL/hr over 60 Minutes Intravenous Every 12 hours 05/31/16 1123 06/01/16 1514   05/31/16 0830  vancomycin (VANCOCIN) IVPB 750 mg/150 ml premix  Status:  Discontinued     750 mg 150 mL/hr over 60 Minutes Intravenous Every 12 hours 05/30/16 2033 05/31/16 1123   05/28/16 1930  vancomycin (VANCOCIN) 1,250 mg in sodium chloride 0.9 % 250 mL IVPB     1,250 mg 166.7 mL/hr over 90 Minutes Intravenous Every 24 hours 05/27/16 1806 05/30/16 2359   05/27/16 1930  vancomycin (VANCOCIN) IVPB 1000 mg/200 mL premix     1,000 mg 200 mL/hr over 60 Minutes Intravenous  Once  05/27/16 1806 05/27/16 2111   05/27/16 1830  vancomycin (VANCOCIN) IVPB 1000 mg/200 mL premix     1,000 mg 200 mL/hr over 60 Minutes Intravenous  Once 05/27/16 1806 05/27/16 2001   05/27/16 1130  fluconazole (DIFLUCAN) IVPB 200 mg  Status:  Discontinued     200 mg 100 mL/hr over 60 Minutes Intravenous Every 24 hours 05/27/16 1115 05/27/16 1116   05/27/16 1130  fluconazole (DIFLUCAN) IVPB 400 mg  Status:  Discontinued     400 mg 100 mL/hr over 120 Minutes Intravenous Every 24 hours 05/27/16 1116 06/01/16 1514   05/27/16 1100  piperacillin-tazobactam (ZOSYN) IVPB 3.375 g  Status:  Discontinued     3.375 g 12.5 mL/hr over 240 Minutes Intravenous Every 8 hours 05/27/16 1037 06/01/16 1514   05/23/16 2000  cefTRIAXone (ROCEPHIN) 2 g in dextrose 5 % 50 mL IVPB  Status:  Discontinued     2 g 100 mL/hr over 30 Minutes Intravenous Every 24 hours 05/23/16 1904 05/27/16 0932   05/21/16 1800  vancomycin (VANCOCIN) IVPB 1000 mg/200 mL premix  Status:  Discontinued     1,000 mg 200 mL/hr over 60 Minutes Intravenous Every 24 hours 05/21/16 0753 05/22/16 1559   05/21/16 1000  piperacillin-tazobactam (ZOSYN) IVPB 3.375 g  Status:  Discontinued     3.375 g 12.5 mL/hr over 240 Minutes Intravenous Every 8  hours 05/21/16 0749 05/23/16 1904   05/21/16 0315  metroNIDAZOLE (FLAGYL) IVPB 500 mg     500 mg 100 mL/hr over 60 Minutes Intravenous  Once 05/21/16 0304 05/21/16 0450   05/21/16 0145  piperacillin-tazobactam (ZOSYN) IVPB 3.375 g     3.375 g 100 mL/hr over 30 Minutes Intravenous  Once 05/21/16 0135 05/21/16 0218   05/21/16 0145  vancomycin (VANCOCIN) IVPB 1000 mg/200 mL premix     1,000 mg 200 mL/hr over 60 Minutes Intravenous  Once 05/21/16 0135 05/21/16 0300      Assessment/Plan: s/p Procedure(s): LAPAROSCOPIC CHOLECYSTECTOMY Impression: Patient being discharged to nursing home today. Would continue oral antibiotics including Diflucan for 1 more week. No need for follow-up in my office. Please  contact me if I can be of further assistance.  LOS: 13 days    Derris Millan A 06/03/2016

## 2016-06-03 NOTE — Progress Notes (Signed)
Speech Language Pathology Treatment: Dysphagia  Patient Details Name: Jesus Maldonado MRN: [id number] DOB: [birth year] Today's Date: 06/03/2016 Time: 1594-5859 SLP Time Calculation (min) (ACUTE ONLY): 21 min  Assessment / Plan / Recommendation Clinical Impression  Pt seen at bedside for diet tolerance and compensatory strategies as needed. Pt alert and cooperative and reports good tolerance of AM meal. He did not eat his sausage because he found it to be "too spicy". Pt consumed thin liquids via straw sips and biscuit with jelly. Pt with mildly prolonged oral phase with solids, which may be baseline due to dentures. Recommend continue D3/mech soft and thin liquids and f/u SLP services at SNF for diet tolerance and upgrades to regular textures as appropriate. Pt OK for po meds whole with water.    HPI HPI: 70 yom with a history of CAD s/p CABG x4. A-fib not on any anticoagulation, HTN, HLD, and parkinson disease, presented with complaints of abdominal pain. While in the ED, serology shows creatine 1.65, total bili 4.2, WBC 15.0, and hemoglobin 11.8. Abdominal CT is consistent with cholecystitis. General surgery and GI were consulted and he was admitted for further evaluation of acute cholecystitis. Blood cultures returned positive for E coli and he has been continued on rocephin. He underwent lap cholecystectomy in which he was found to have a gangrenous gallbladder.      SLP Plan  All goals met;Discharge SLP treatment due to (comment)     Recommendations  Diet recommendations: Dysphagia 3 (mechanical soft);Thin liquid Liquids provided via: Cup;Straw Medication Administration: Whole meds with liquid Supervision: Patient able to self feed;Intermittent supervision to cue for compensatory strategies Compensations: Slow rate;Follow solids with liquid Postural Changes and/or Swallow Maneuvers: Seated upright 90 degrees;Upright 30-60 min after meal                Oral Care Recommendations: Oral  care BID;Staff/trained caregiver to provide oral care Follow up Recommendations: Skilled Nursing facility Plan: All goals met;Discharge SLP treatment due to (comment)       Thank you,  Genene Churn, [place]                [place] 06/03/2016, 10:36 AM

## 2017-06-04 IMAGING — US US EXTREM  UP VENOUS*R*
1 series · 13 of 24 positions shown · non-contrast
Comparison: None.

CLINICAL DATA: Right upper extremity edema for 3 days



[Series 1: us extrem up venous*right* · 0.08mm/px · 13 of 40 slices shown]
[im 1/40]
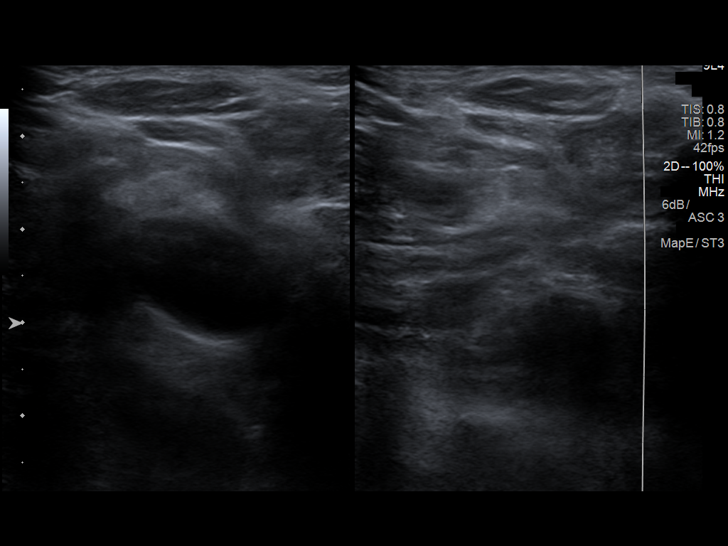
[im 4/40]
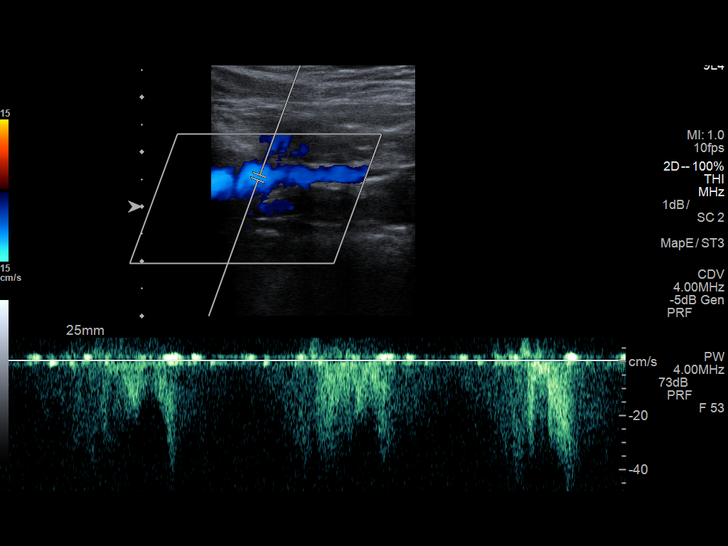
[im 7/40]
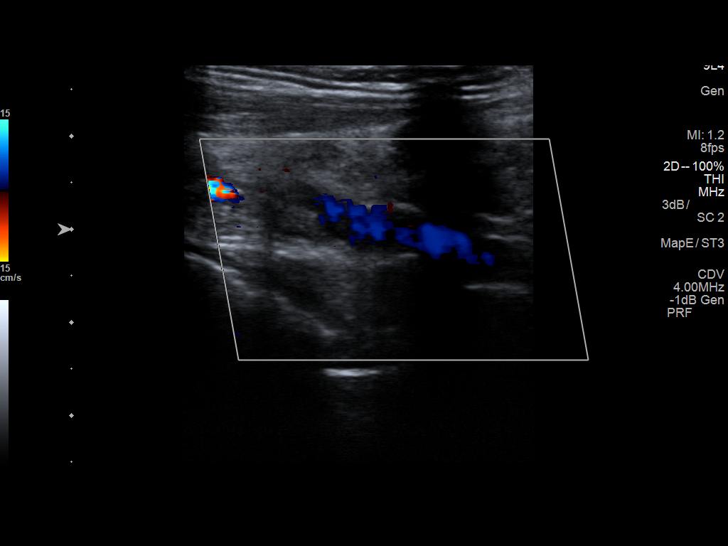
[im 11/40]
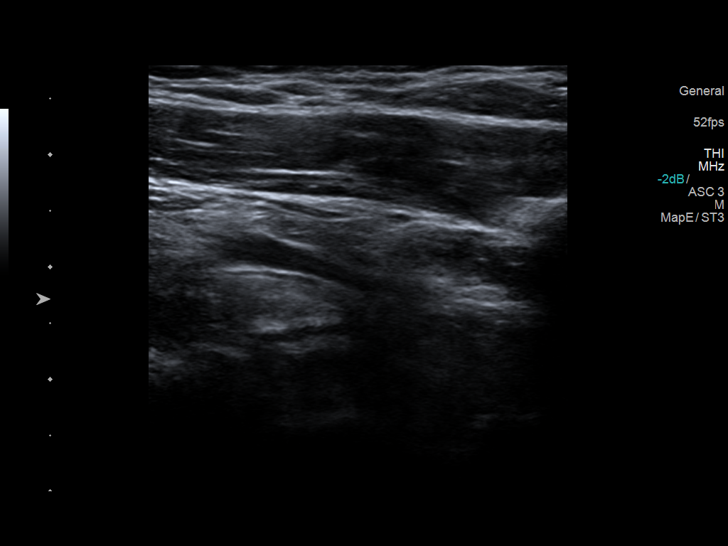
[im 14/40]
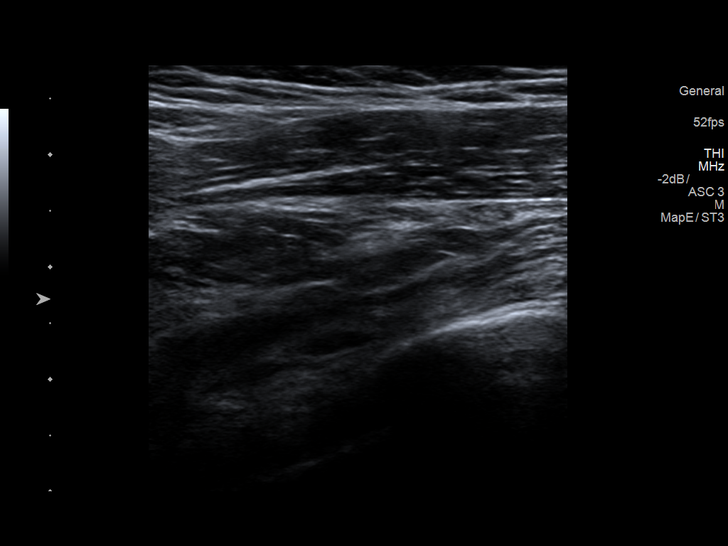
[im 17/40]
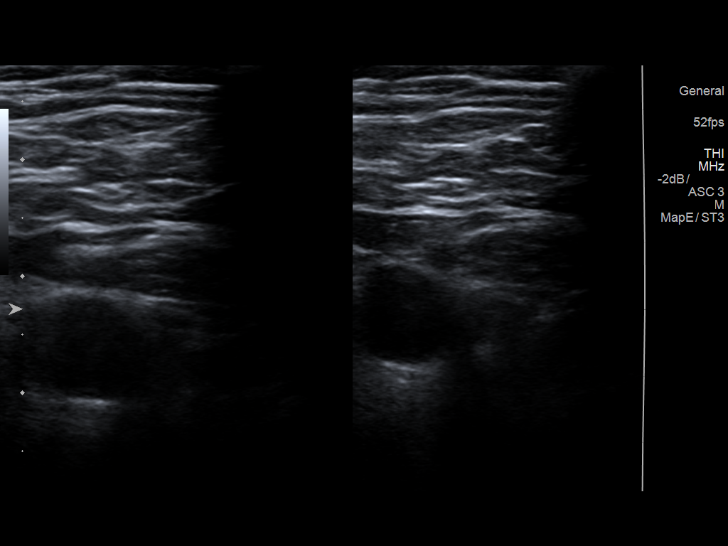
[im 21/40]
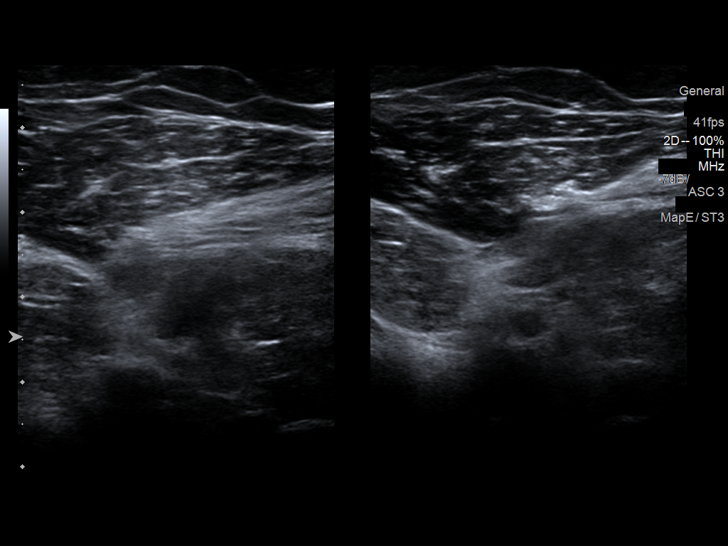
[im 23/40]
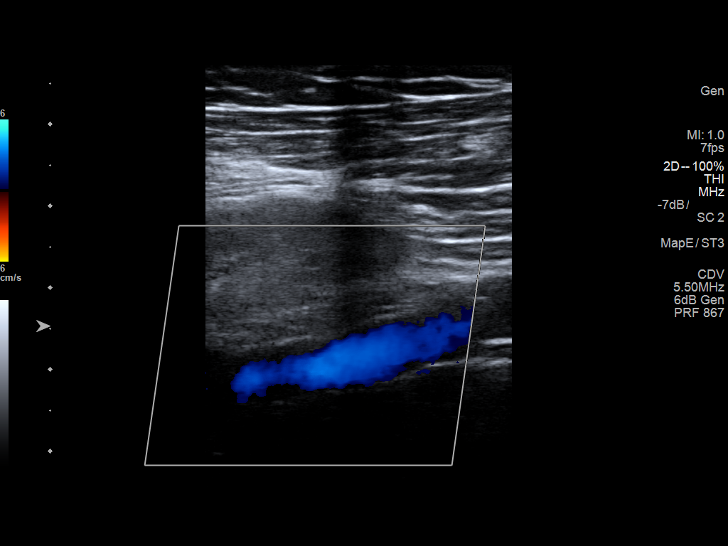
[im 26/40]
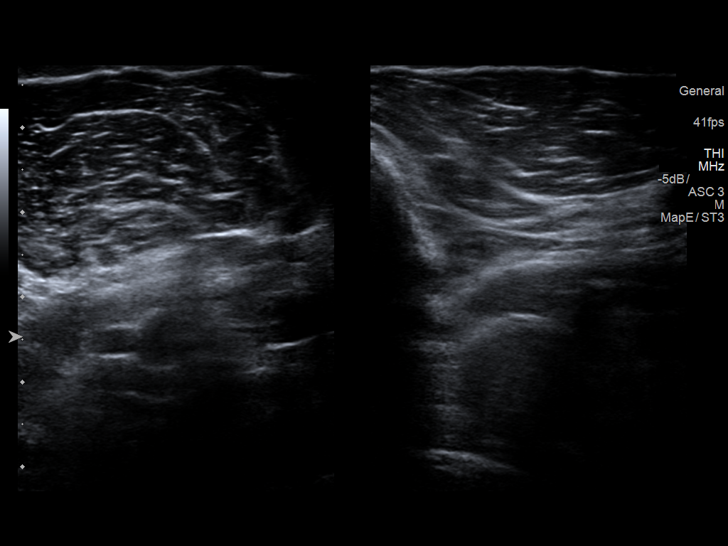
[im 29/40]
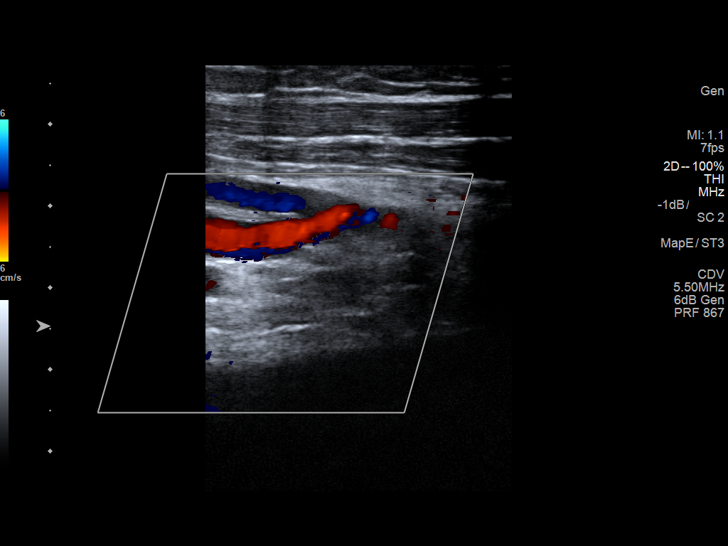
[im 33/40]
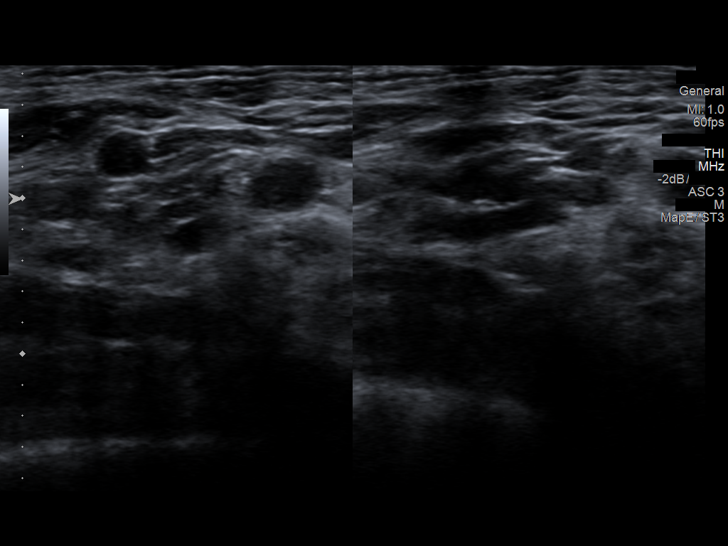
[im 36/40]
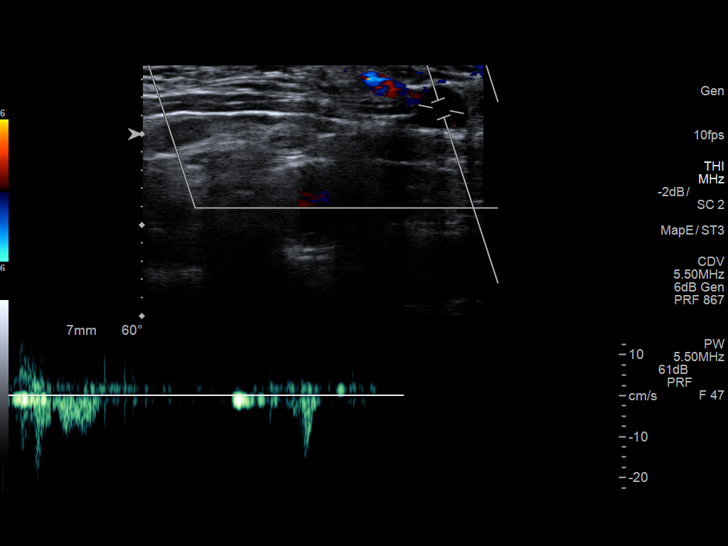
[im 40/40]
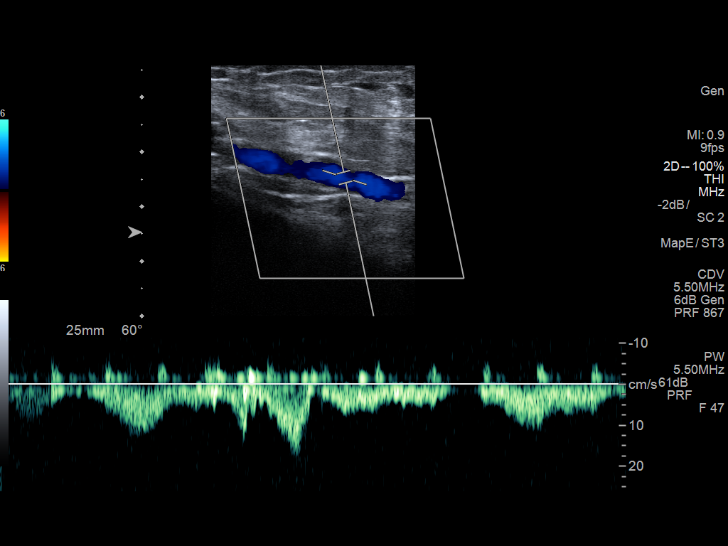

[13 of 24 positions shown; findings below may reference images not displayed]

FINDINGS: Contralateral Subclavian Vein: Respiratory phasicity is normal and
symmetric with the symptomatic side. No evidence of thrombus. Normal
compressibility.

Internal Jugular Vein: No evidence of thrombus. Normal
compressibility, respiratory phasicity and response to augmentation.

Subclavian Vein: No evidence of thrombus. Normal compressibility,
respiratory phasicity and response to augmentation.

Axillary Vein: No evidence of thrombus. Normal compressibility,
respiratory phasicity and response to augmentation.

Cephalic Vein: No evidence of thrombus. Normal compressibility,
respiratory phasicity and response to augmentation.

Basilic Vein: No evidence of thrombus. Normal compressibility,
respiratory phasicity and response to augmentation.

Brachial Veins: No evidence of thrombus. Normal compressibility,
respiratory phasicity and response to augmentation.

Radial Veins: No evidence of thrombus. Normal compressibility,
respiratory phasicity and response to augmentation.

Ulnar Veins: No evidence of thrombus. Normal compressibility,
respiratory phasicity and response to augmentation.

Venous Reflux:  None visualized.

Other Findings:  None visualized.
IMPRESSION: No evidence of deep venous thrombosis.

## 2017-06-25 IMAGING — CT CT IMAGE GUIDED FLUID DRAIN BY CATHETER
1 of 2 series · 14 of 32 positions shown, 19 images · non-contrast
Comparison: none

INDICATION: Gallbladder fossa abscess

[Series 2: i-spiral 5.0 b40f · axial · 0.98mm/px · z∈[+976,+1130]mm · 14 of 50 slices shown, 19 images]
[im 3/50  soft-tissue]
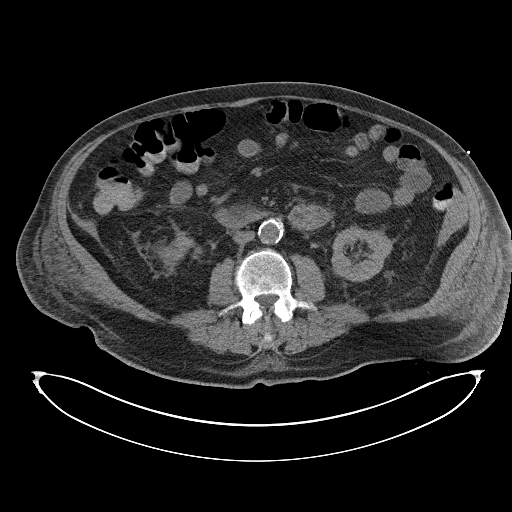
[im 3/50  bone]
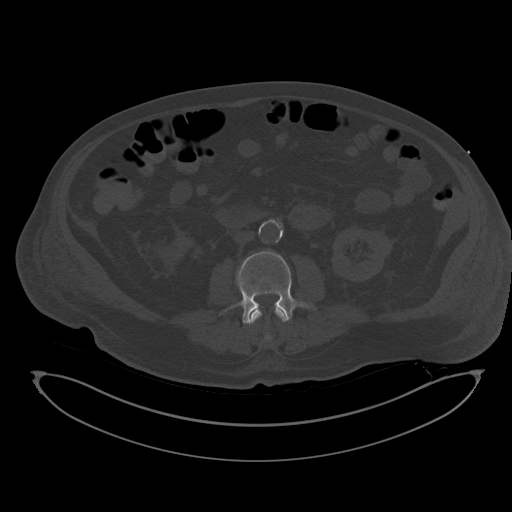
[im 6/50  soft-tissue]
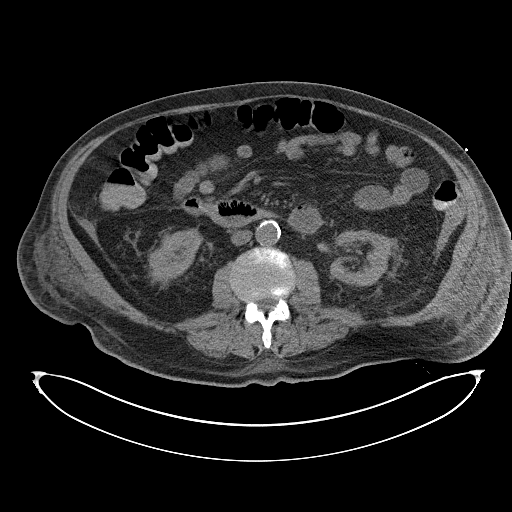
[im 12/50  soft-tissue]
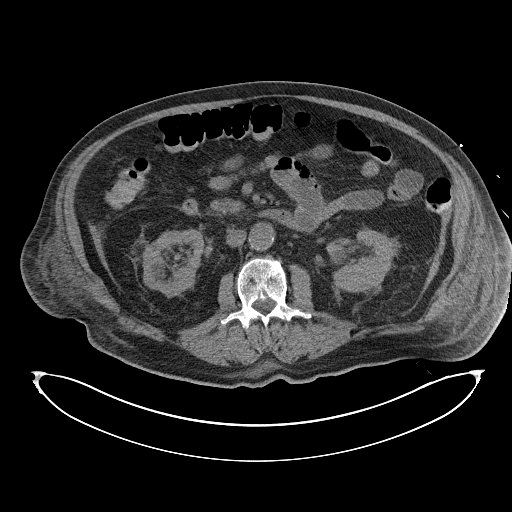
[im 15/50  soft-tissue]
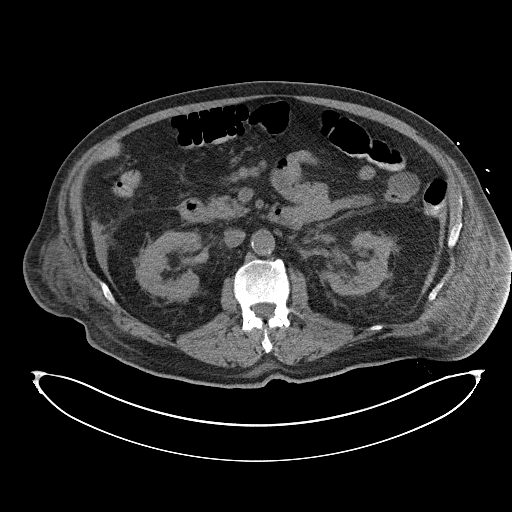
[im 18/50  soft-tissue]
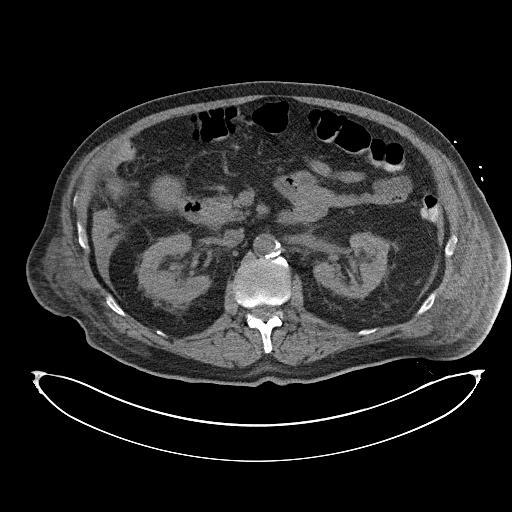
[im 21/50  soft-tissue]
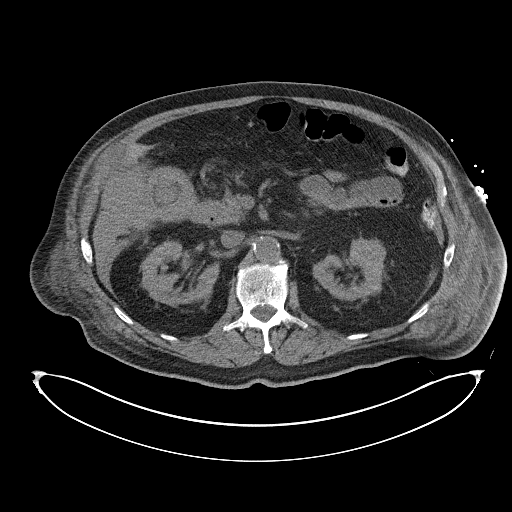
[im 26/50  soft-tissue]
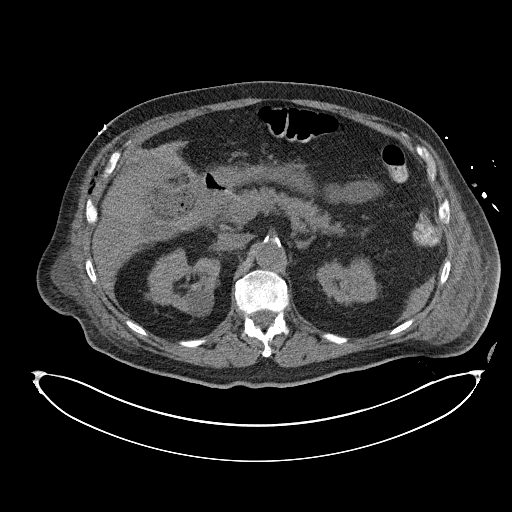
[im 29/50  soft-tissue]
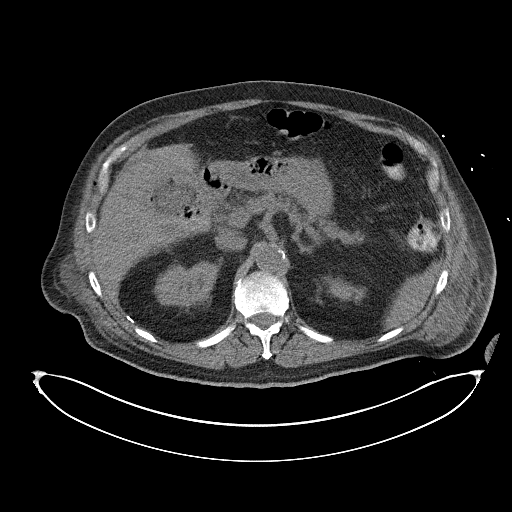
[im 32/50  soft-tissue]
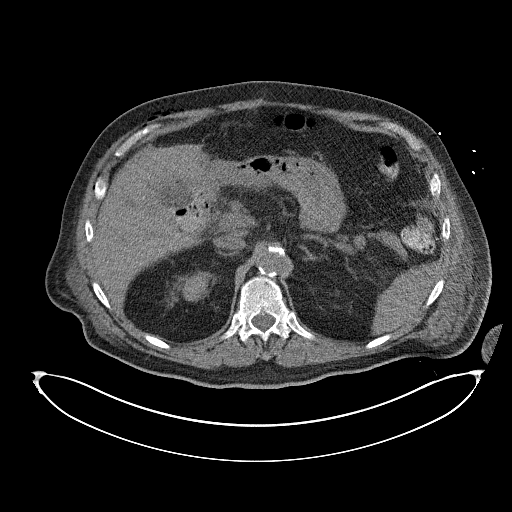
[im 32/50  bone]
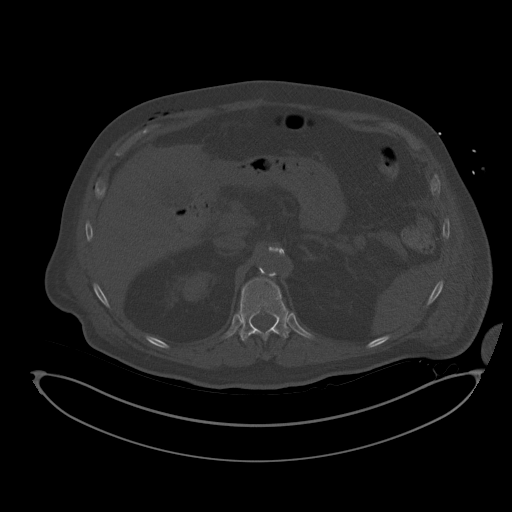
[im 35/50  soft-tissue]
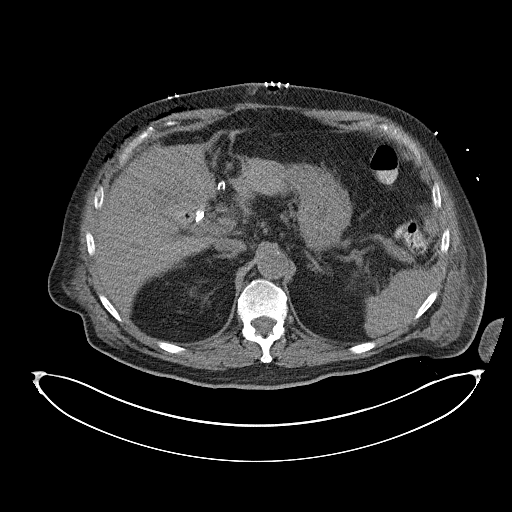
[im 38/50  soft-tissue]
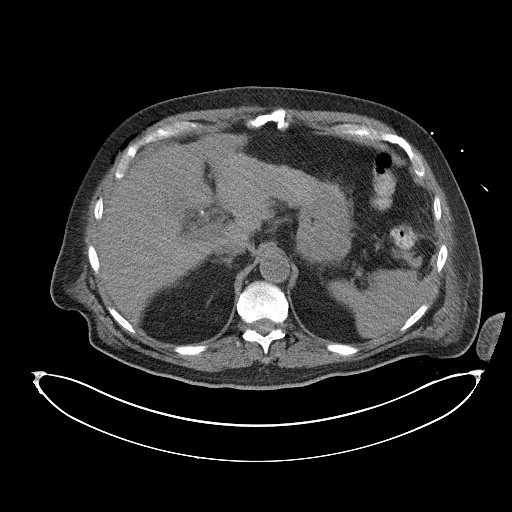
[im 38/50  lung]
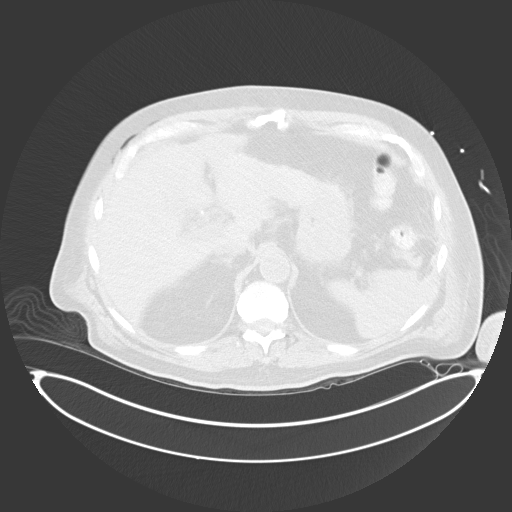
[im 41/50  lung]
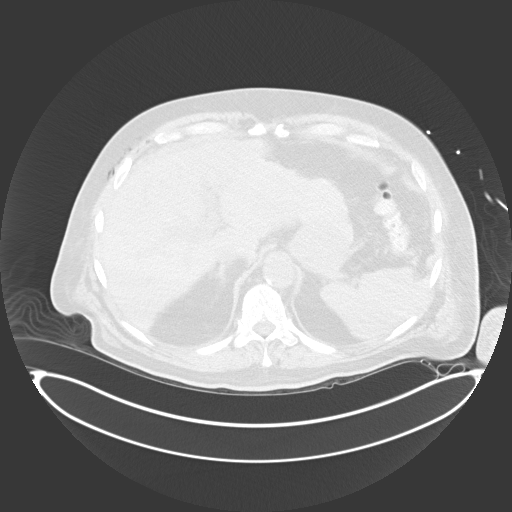
[im 44/50  soft-tissue]
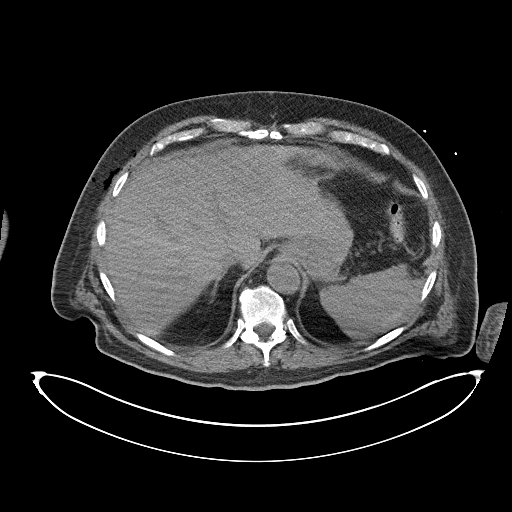
[im 44/50  lung]
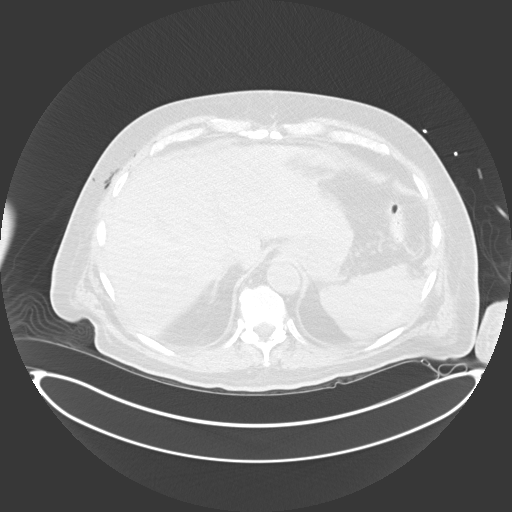
[im 47/50  soft-tissue]
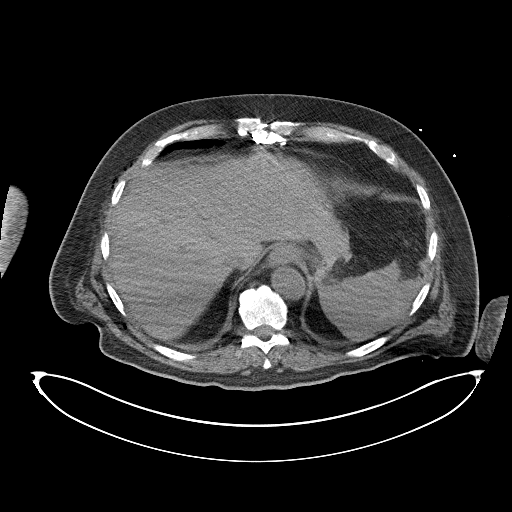
[im 47/50  lung]
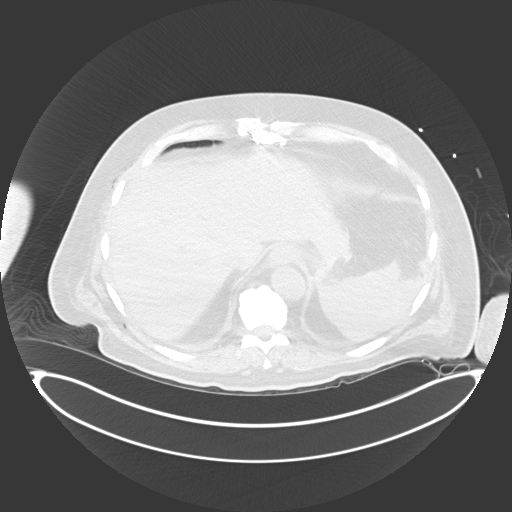

[14 of 32 positions shown; findings below may reference images not displayed]

EXAM:
CT IMAGE GUIDED FLUID DRAIN BY CATHETER

MEDICATIONS:
The patient is currently admitted to the hospital and receiving
intravenous antibiotics. The antibiotics were administered within an
appropriate time frame prior to the initiation of the procedure.

ANESTHESIA/SEDATION:
None.

COMPLICATIONS:
None immediate.

PROCEDURE:
Informed written consent was obtained from the patient after a
thorough discussion of the procedural risks, benefits and
alternatives. All questions were addressed. Maximal Sterile Barrier
Technique was utilized including caps, mask, sterile gowns, sterile
gloves, sterile drape, hand hygiene and skin antiseptic. A timeout
was performed prior to the initiation of the procedure.

In the supine position, the right flank was prepped and draped. 1%
lidocaine was utilized for local anesthesia. Under CT guidance, an
18 gauge needle was inserted into the gallbladder fossa fluid
collection via transhepatic approach. It was removed over an Amplatz
wire. A 10 French dilator followed by a 10 French drain were
inserted over the wire and coiled in the gallbladder fossa fluid
collection. Dark reddish fluid was aspirated. It was looped and
string fixed then sewn to the skin.
FINDINGS: Images document placement of a 10 French drain in a gallbladder
fossa fluid collection.
IMPRESSION: Successful gallbladder fossa fluid collection 10 French drain
placement.

## 2019-09-06 DEATH — deceased
